# Patient Record
Sex: Female | Born: 1965
Health system: Southern US, Community
[De-identification: ages and names within clinical notes are randomized; demographics above are authoritative.]

## PROBLEM LIST (undated history)

## (undated) DIAGNOSIS — Q851 Tuberous sclerosis: Secondary | ICD-10-CM

## (undated) DIAGNOSIS — F329 Major depressive disorder, single episode, unspecified: Secondary | ICD-10-CM

## (undated) DIAGNOSIS — K7689 Other specified diseases of liver: Secondary | ICD-10-CM

## (undated) DIAGNOSIS — J189 Pneumonia, unspecified organism: Secondary | ICD-10-CM

## (undated) DIAGNOSIS — F909 Attention-deficit hyperactivity disorder, unspecified type: Secondary | ICD-10-CM

## (undated) DIAGNOSIS — F32A Depression, unspecified: Secondary | ICD-10-CM

## (undated) DIAGNOSIS — E611 Iron deficiency: Secondary | ICD-10-CM

## (undated) DIAGNOSIS — I10 Essential (primary) hypertension: Secondary | ICD-10-CM

## (undated) DIAGNOSIS — L814 Other melanin hyperpigmentation: Secondary | ICD-10-CM

## (undated) DIAGNOSIS — F319 Bipolar disorder, unspecified: Secondary | ICD-10-CM

## (undated) DIAGNOSIS — F419 Anxiety disorder, unspecified: Secondary | ICD-10-CM

## (undated) DIAGNOSIS — N289 Disorder of kidney and ureter, unspecified: Secondary | ICD-10-CM

## (undated) DIAGNOSIS — K635 Polyp of colon: Secondary | ICD-10-CM

## (undated) DIAGNOSIS — M5090 Cervical disc disorder, unspecified, unspecified cervical region: Secondary | ICD-10-CM

## (undated) HISTORY — DX: Major depressive disorder, single episode, unspecified: F32.9

## (undated) HISTORY — DX: Pneumonia, unspecified organism: J18.9

## (undated) HISTORY — DX: Essential (primary) hypertension: I10

## (undated) HISTORY — DX: Bipolar disorder, unspecified: F31.9

## (undated) HISTORY — DX: Cervical disc disorder, unspecified, unspecified cervical region: M50.90

## (undated) HISTORY — DX: Attention-deficit hyperactivity disorder, unspecified type: F90.9

## (undated) HISTORY — DX: Anxiety disorder, unspecified: F41.9

## (undated) HISTORY — DX: Disorder of kidney and ureter, unspecified: N28.9

## (undated) HISTORY — DX: Depression, unspecified: F32.A

## (undated) HISTORY — DX: Polyp of colon: K63.5

## (undated) HISTORY — PX: COLONOSCOPY W/ ENDOSCOPIC US: SHX1379

## (undated) HISTORY — DX: Other specified diseases of liver: K76.89

## (undated) HISTORY — DX: Iron deficiency: E61.1

## (undated) HISTORY — DX: Tuberous sclerosis: Q85.1

## (undated) HISTORY — DX: Other melanin hyperpigmentation: L81.4

## (undated) HISTORY — PX: RENAL ARTERY EMBOLIZATION: SHX2319

## (undated) HISTORY — PX: NEPHRECTOMY: SHX65

---

## 1998-11-01 ENCOUNTER — Other Ambulatory Visit: Admission: RE | Admit: 1998-11-01 | Discharge: 1998-11-01 | Payer: Self-pay | Admitting: *Deleted

## 2001-12-19 ENCOUNTER — Other Ambulatory Visit: Admission: RE | Admit: 2001-12-19 | Discharge: 2001-12-19 | Payer: Self-pay | Admitting: Obstetrics and Gynecology

## 2003-01-23 ENCOUNTER — Encounter: Payer: Self-pay | Admitting: Family Medicine

## 2003-01-23 ENCOUNTER — Encounter: Admission: RE | Admit: 2003-01-23 | Discharge: 2003-01-23 | Payer: Self-pay | Admitting: Family Medicine

## 2003-09-17 ENCOUNTER — Inpatient Hospital Stay (HOSPITAL_COMMUNITY): Admission: AD | Admit: 2003-09-17 | Discharge: 2003-09-19 | Payer: Self-pay | Admitting: Urology

## 2003-10-30 ENCOUNTER — Ambulatory Visit (HOSPITAL_COMMUNITY): Admission: RE | Admit: 2003-10-30 | Discharge: 2003-10-30 | Payer: Self-pay | Admitting: Urology

## 2003-10-31 ENCOUNTER — Encounter: Admission: RE | Admit: 2003-10-31 | Discharge: 2003-10-31 | Payer: Self-pay | Admitting: Urology

## 2004-05-25 ENCOUNTER — Inpatient Hospital Stay (HOSPITAL_COMMUNITY): Admission: EM | Admit: 2004-05-25 | Discharge: 2004-05-30 | Payer: Self-pay | Admitting: Vascular Surgery

## 2004-11-04 ENCOUNTER — Emergency Department (HOSPITAL_COMMUNITY): Admission: EM | Admit: 2004-11-04 | Discharge: 2004-11-04 | Payer: Self-pay | Admitting: *Deleted

## 2004-12-05 ENCOUNTER — Encounter: Admission: RE | Admit: 2004-12-05 | Discharge: 2004-12-05 | Payer: Self-pay | Admitting: Obstetrics and Gynecology

## 2005-07-07 ENCOUNTER — Ambulatory Visit (HOSPITAL_COMMUNITY): Admission: RE | Admit: 2005-07-07 | Discharge: 2005-07-07 | Payer: Self-pay | Admitting: Urology

## 2005-10-30 IMAGING — XA IR ANGIO/RENAL UNI WO/W FLUSH*R*
1 series · 13 of 24 positions shown · non-contrast
Comparison: none

CLINICAL DATA: Acute right retroperitoneal hemorrhage with multiple bilateral renal angiomyolipomas.  Previous right partial nephrectomy.  

1.  RIGHT RENAL ARTERIOGRAM
2.  RIGHT RENAL EMBOLIZATION
TECHNIQUE: The right groin region was prepped with Betadine, draped in the usual sterile fashion, and infiltrated locally with 1% lidocaine.  Intravenous fentanyl and Versed were administered as conscious sedation during continuous cardiorespiratory monitoring by the radiology staff.  The right common femoral artery was accessed with a 19 gauge needle, exchanged over a Bentson wire for a 5 French pigtail catheter, and advanced into the abdominal aorta for flush aortography.  This demonstrates single left and single right renal arteries.  The pigtail was then exchanged for a 5 French vascular sheath, through which a 5 French C2 catheter was advanced and used to selectively catheterize the right renal artery for selective right renal arteriography.  After identification of a vascular anomaly, the supplying third order branch was selectively catheterized with the 5 French C2 catheter with the aid of an angled glidewire.  More distal access was achieved with a coaxial Renegade microcatheter.  Through this, this branch artery was embolized using a combination of 2 mm, 3 mm, and 4 mm embolization coils through the microcatheter and through the C2 catheter.  Follow-up angiogram was performed.  The C2 catheter was then withdrawn back into the right renal artery for follow-up right renal arteriography.  Then, after telephone discussion of the findings with Dr. Jaylon, the catheter and sheath were removed and hemostasis achieved at the site.  No immediate complication.

[Series 1000: run · 0.19mm/px · 13 of 85 slices shown]
[im 1/85]
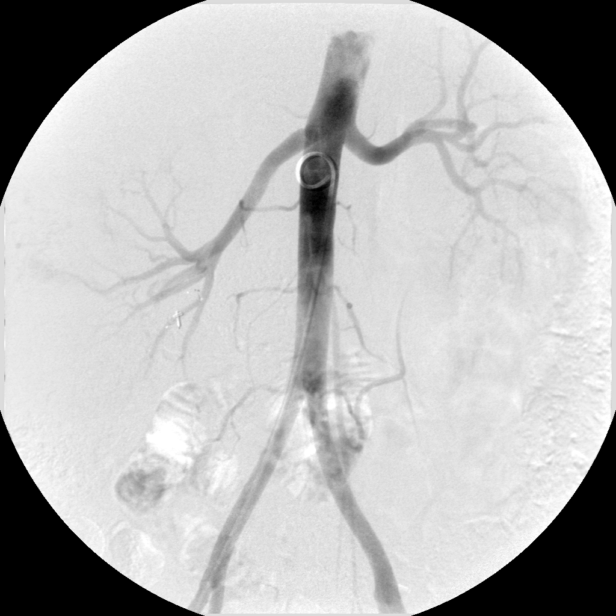
[im 8/85]
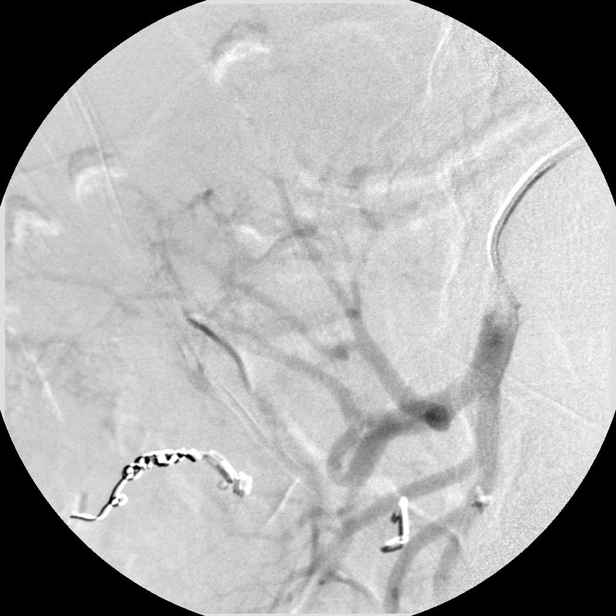
[im 15/85]
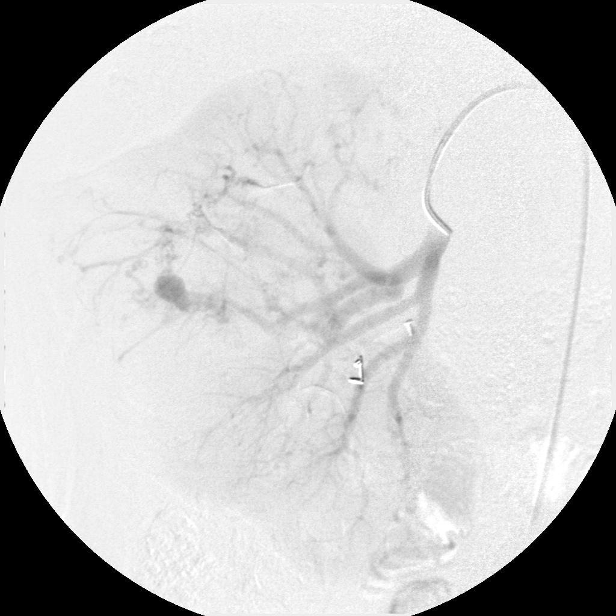
[im 22/85]
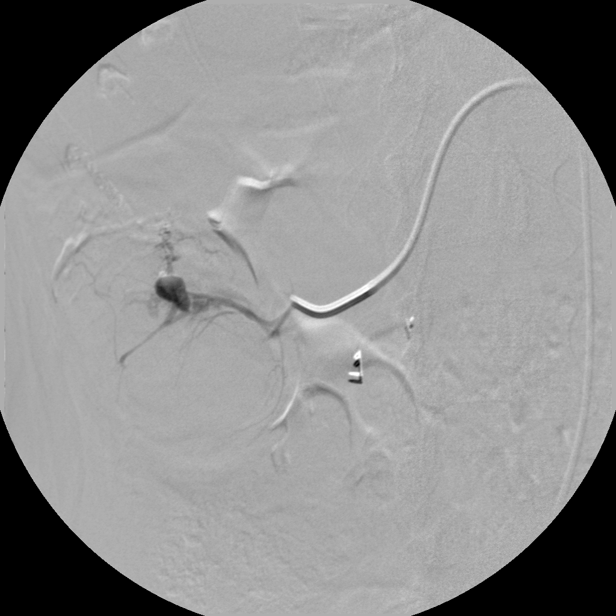
[im 30/85]
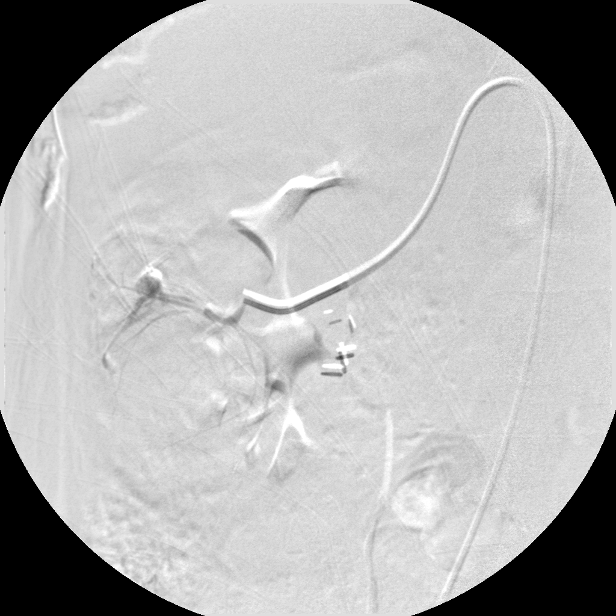
[im 37/85]
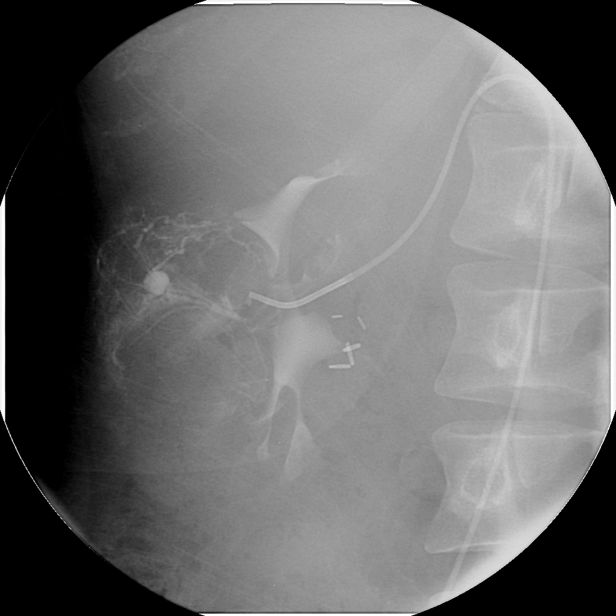
[im 44/85]
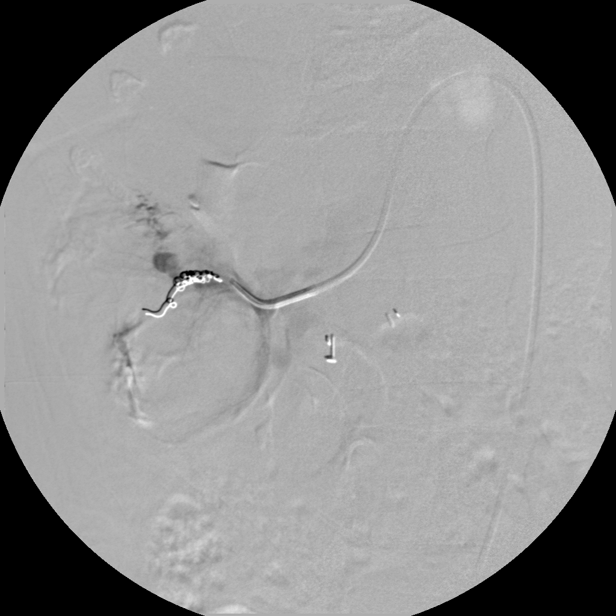
[im 48/85]
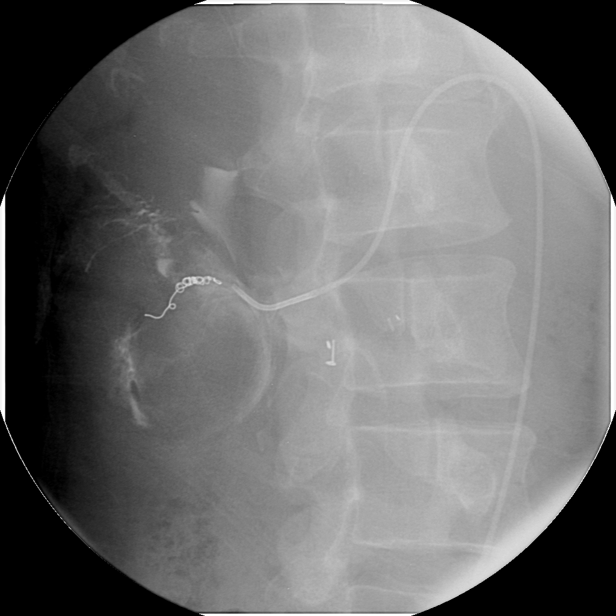
[im 55/85]
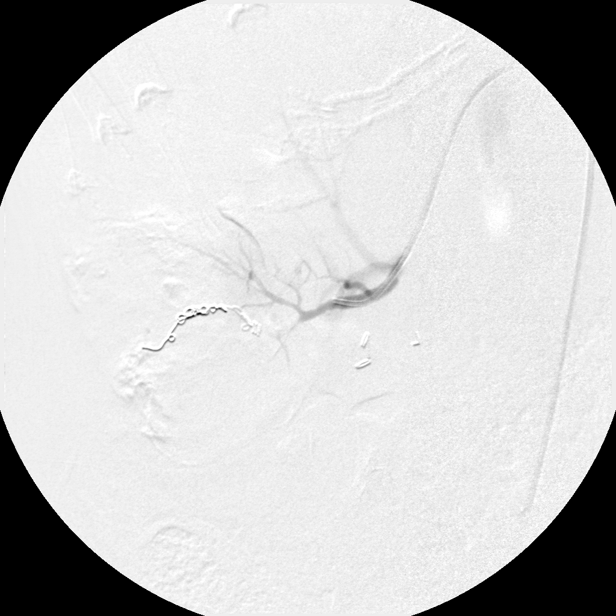
[im 63/85]
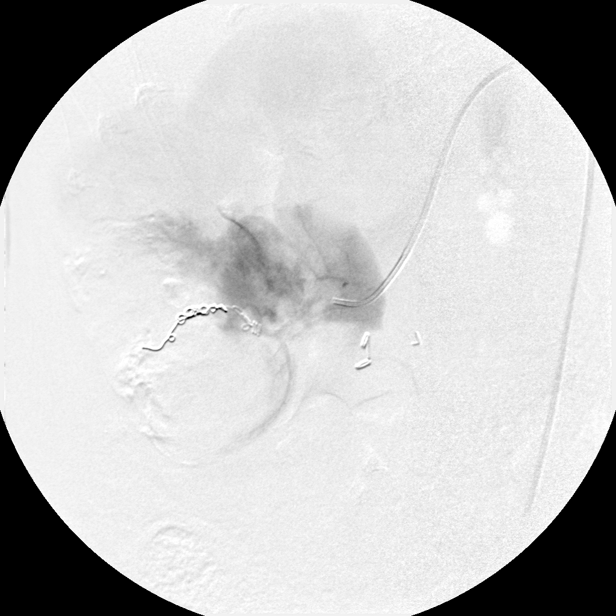
[im 70/85]
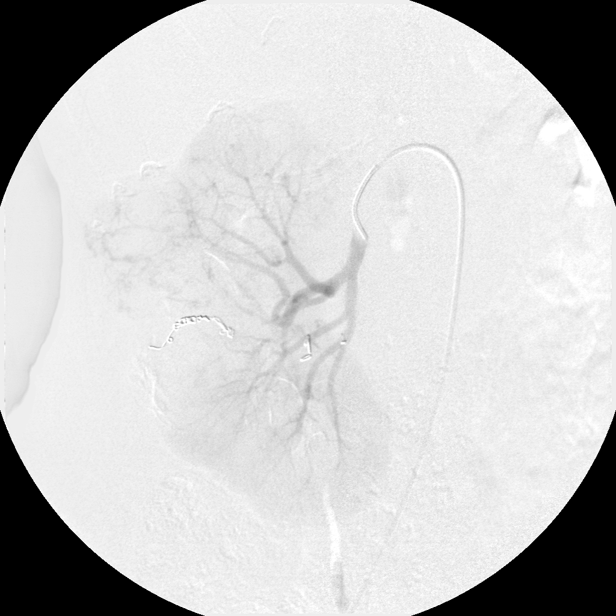
[im 77/85]
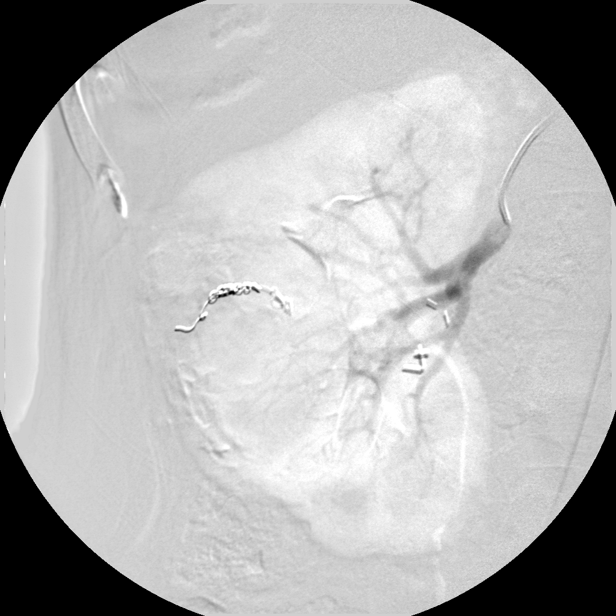
[im 85/85]
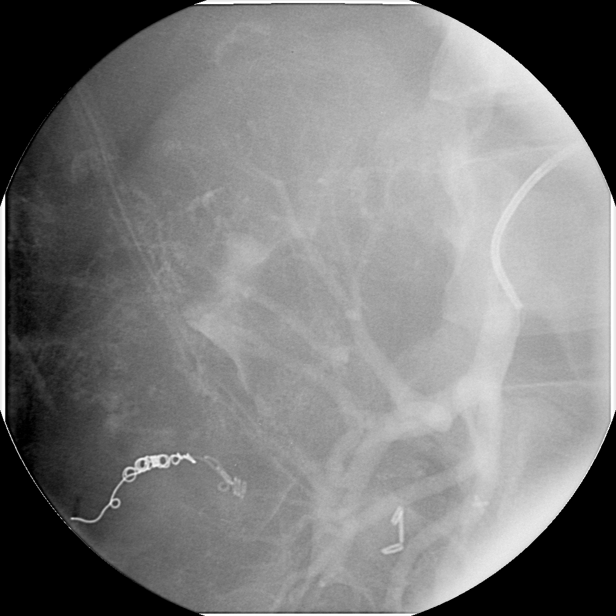

[13 of 24 positions shown; findings below may reference images not displayed]

FINDINGS: Single right and a single left renal arteries.  

Selective right renal arteriography demonstrates a prominent pseudoaneurysm from a midpole branch of the right renal artery, with extravasation.  There is a large area of decreased enhancement displacing the cortex, and some tumor vascularity is evident at the superior margin of this lesion which corresponds to the AML seen on recent CT scan.  Very early venous filling was identified from the region of the pseudoaneurysm suggesting an AV fistulous connection as well.  The vessel beyond the pseudoaneurysm was catheterized with a microcatheter and embolization coils were deposited distal and proximal to the lesion, resulting in complete cessation of flow through the pseudoaneurysm and AV fistula.  

Attention was then turned to the other tumor vascularity seen but this was found to be extensive with no discrete additional outstanding lesion.  After discussion with Dr. Jaylon, the procedure was then terminated.

IMPRESSION
1.  Multiple areas of tumor vascularity in the right kidney with a focal area of decreased blush in the mid to lower pole corresponding to the hematoma seen on CT.
2.  A discrete pseudoaneurysm with a small amount of extravasation was noted in the midst of the hematoma, associated with AV fistulous connection, and this was successfully embolized with multiple transcatheter coils as detailed above.

## 2006-01-28 ENCOUNTER — Encounter: Admission: RE | Admit: 2006-01-28 | Discharge: 2006-01-28 | Payer: Self-pay | Admitting: Obstetrics and Gynecology

## 2008-11-20 ENCOUNTER — Encounter: Admission: RE | Admit: 2008-11-20 | Discharge: 2008-11-20 | Payer: Self-pay | Admitting: Obstetrics and Gynecology

## 2009-05-03 ENCOUNTER — Ambulatory Visit (HOSPITAL_COMMUNITY): Admission: RE | Admit: 2009-05-03 | Discharge: 2009-05-03 | Payer: Self-pay | Admitting: Urology

## 2009-11-06 ENCOUNTER — Ambulatory Visit: Payer: Self-pay | Admitting: Internal Medicine

## 2009-11-06 ENCOUNTER — Inpatient Hospital Stay (HOSPITAL_COMMUNITY): Admission: AD | Admit: 2009-11-06 | Discharge: 2009-11-09 | Payer: Self-pay | Admitting: Urology

## 2010-04-24 ENCOUNTER — Encounter: Admission: RE | Admit: 2010-04-24 | Discharge: 2010-04-24 | Payer: Self-pay | Admitting: Obstetrics and Gynecology

## 2010-10-18 ENCOUNTER — Encounter: Payer: Self-pay | Admitting: Nephrology

## 2010-10-18 ENCOUNTER — Encounter: Payer: Self-pay | Admitting: Urology

## 2010-10-19 ENCOUNTER — Encounter: Payer: Self-pay | Admitting: Obstetrics and Gynecology

## 2010-10-19 ENCOUNTER — Encounter: Payer: Self-pay | Admitting: Nephrology

## 2010-12-17 LAB — PREPARE RBC (CROSSMATCH)

## 2010-12-17 LAB — CBC
HCT: 25.2 % — ABNORMAL LOW (ref 36.0–46.0)
HCT: 26.9 % — ABNORMAL LOW (ref 36.0–46.0)
Hemoglobin: 11.2 g/dL — ABNORMAL LOW (ref 12.0–15.0)
Hemoglobin: 7.1 g/dL — ABNORMAL LOW (ref 12.0–15.0)
Hemoglobin: 8.8 g/dL — ABNORMAL LOW (ref 12.0–15.0)
Hemoglobin: 9.2 g/dL — ABNORMAL LOW (ref 12.0–15.0)
MCHC: 34 g/dL (ref 30.0–36.0)
MCHC: 34.3 g/dL (ref 30.0–36.0)
MCV: 91.3 fL (ref 78.0–100.0)
Platelets: 316 10*3/uL (ref 150–400)
RDW: 13.6 % (ref 11.5–15.5)
RDW: 14 % (ref 11.5–15.5)
RDW: 14 % (ref 11.5–15.5)
RDW: 14.1 % (ref 11.5–15.5)
WBC: 8.6 10*3/uL (ref 4.0–10.5)

## 2010-12-17 LAB — COMPREHENSIVE METABOLIC PANEL
ALT: 32 U/L (ref 0–35)
ALT: 40 U/L — ABNORMAL HIGH (ref 0–35)
Albumin: 3.7 g/dL (ref 3.5–5.2)
Alkaline Phosphatase: 80 U/L (ref 39–117)
Calcium: 8.4 mg/dL (ref 8.4–10.5)
Chloride: 99 mEq/L (ref 96–112)
Glucose, Bld: 115 mg/dL — ABNORMAL HIGH (ref 70–99)
Glucose, Bld: 212 mg/dL — ABNORMAL HIGH (ref 70–99)
Potassium: 4.5 mEq/L (ref 3.5–5.1)
Sodium: 133 mEq/L — ABNORMAL LOW (ref 135–145)
Sodium: 136 mEq/L (ref 135–145)
Total Bilirubin: 0.6 mg/dL (ref 0.3–1.2)
Total Protein: 6.4 g/dL (ref 6.0–8.3)
Total Protein: 6.8 g/dL (ref 6.0–8.3)

## 2010-12-17 LAB — MRSA PCR SCREENING: MRSA by PCR: NEGATIVE

## 2010-12-17 LAB — CROSSMATCH

## 2010-12-17 LAB — APTT: aPTT: 29 seconds (ref 24–37)

## 2010-12-17 LAB — BASIC METABOLIC PANEL
CO2: 25 mEq/L (ref 19–32)
Chloride: 99 mEq/L (ref 96–112)
Glucose, Bld: 158 mg/dL — ABNORMAL HIGH (ref 70–99)
Potassium: 4.7 mEq/L (ref 3.5–5.1)
Sodium: 135 mEq/L (ref 135–145)

## 2010-12-17 LAB — HEMOGLOBIN AND HEMATOCRIT, BLOOD
HCT: 31.9 % — ABNORMAL LOW (ref 36.0–46.0)
Hemoglobin: 11 g/dL — ABNORMAL LOW (ref 12.0–15.0)

## 2011-02-13 NOTE — Discharge Summary (Signed)
Melissa Ochoa, Melissa Ochoa                      ACCOUNT NO.:  [id number]   MEDICAL RECORD NO.:  [id number]                   PATIENT TYPE:  INP   LOCATION:  0357                                 FACILITY:  [facility]   PHYSICIAN:  Sigmund I. Patsi Sears, M.D.         DATE OF BIRTH:  Aug 15, 1966   DATE OF ADMISSION:  09/17/2003  DATE OF DISCHARGE:  09/19/2003                                 DISCHARGE SUMMARY   DISCHARGE DIAGNOSES:  1. Bilateral angiomyolipomas.  2. Gross hematuria.  3. Status post right renal arteriogram and embolization.  4. Recurrent left hand and arm numbness.   HISTORY OF PRESENT ILLNESS:  This patient is a 45 year old white female  patient of Dr. Imelda Pillow with a history of partial nephrectomy in 1996  for an 8 cm angiomyolipoma.  She had an onset at 3 a.m. on December 20 of  severe right flank pain.  She reports that she was having a pedicure and sat  in a massage chair yesterday that kneaded her back fairly thoroughly.  She  reports that the pain awoke her from sleep and did become severe with  associated nausea, but no voiding complaints.  She denies any history of  stones or other urological problems other than seeing some blood in her  urine.   ALLERGIES:  No known drug allergies.   CURRENT MEDICATIONS:  1. Paxil 10 mg p.o. daily.  2. Birth control pills.   PAST MEDICAL HISTORY:  1. Hypertension.  2. History of angiomyolipomas with partial nephrectomy in 1996.   HOSPITAL COURSE:  The patient was admitted to the hospital by Dr. Annabell Howells on  December 20.  At that time she was given IV narcotics to help control her  pain.  On September 18, 2003 she was taken to interventional radiology where  a right renal arteriogram was performed.  At that time Dr. Deanne Coffer also  performed an embolization.  He discussed results with Dr. Patsi Sears  including that there were several other small bleeds and that follow-up is  certainly an issue.  She was also consulted by Dr. Melbourne Abts for some  recurrent left arm numbness.  She had an MRI or her head and cervical spine  done this morning.  Those results are pending.  Her pain has been well-  controlled while she has been in the hospital, but the patient complains of  some abdominal crampi0ng at times.  She also states that she has not had a  bowel movement in several days and feels constipated.  Dr. Patsi Sears again  has talked with Dr. Deanne Coffer about the results of her arteriogram and has  elected to let Ms. Tri go home today.  We will cover her with narcotics  to keep her comfortable over the holiday.  She will return to our office  next week for workup for tubular sclerosis.  Note she had a workup for this  back in 1996 which was negative.  We will  also discuss future treatment  plans with her at that time.   DISCHARGE MEDICATIONS:  1. For her to resume all home medications.  2. Percocet 5/325 one p.o. q.4-6 p.r.n. pain.  3. I have also discussed with her the fact that she needs to use some over     the counter products for her constipation.  4. Also discussed with her that the Percocet will continue to keep her     constipated if she does not take some action to get her bowels moving.     The patient understands and will get some over the counter products.   CONDITION ON DISCHARGE:  Stable.   PLAN:  The patient will follow up with Terri Piedra, nurse practitioner, in  the office next week for some blood work as well as Dr. Patsi Sears, for  further treatment plans.     Terri Piedra, N.P.                         Sigmund I. Patsi Sears, M.D.    HB/MEDQ  D:  09/19/2003  T:  09/19/2003  Job:  [id number]   cc:   Genene Churn. Love, M.D.  [address]  [address]  [place]  [place] [address]  Fax: [phone]

## 2011-02-13 NOTE — Consult Note (Signed)
NAMEJAE, BRUCK                      ACCOUNT NO.:  [id number]   MEDICAL RECORD NO.:  [id number]                   PATIENT TYPE:  INP   LOCATION:  0357                                 FACILITY:  [facility]   PHYSICIAN:  Genene Churn. Love, M.D.                 DATE OF BIRTH:  [birth year]   DATE OF CONSULTATION:  09/18/2003  DATE OF DISCHARGE:                                   CONSULTATION   CHIEF COMPLAINT:  This 45 year old right-handed white married female was  admitted to [facility] with right-sided pain and was found to have  evidence of a right renal hematoma.  She is now seen for evaluation of  recurrent left arm numbness.   HISTORY OF PRESENT ILLNESS:  Mrs. Kell had a partial right nephrectomy  in 1996 for a bleeding angiomyolipoma.  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  She has been in good health her entire  life otherwise, kickboxer, and enjoys vigorous activities.  Over the last  year she has had hypertension.  She was admitted for evaluation of right  flank pain that developed when she was having a pedicure and sitting in a  massage chair the day prior to the admission.  In the hospital she  complained of recurrent left hand and arm numbness and I am asked to see  her.  She states that over the last year she has had recurrent left hand and  arm numbness which is vaguely localized and not associated with neck pain  radiating into the left arm.  She does have neck pain and a pressure  sensation in her head when bending her head forward.  She has no history of  L'hermitte's sign or bowel or bladder dysfunction.  She has had no injuries  to her head and neck.  Her left face and left leg are not involved with  similar symptoms.  Her symptoms when they occur last minutes and are not  related to activity.  She notes there is nothing which makes them better and  nothing which brings them on.  In the hospital she has had a CBC and a basic  metabolic panel which are unremarkable.   PAST MEDICAL HISTORY:  1. Hypertension.  2. Right kidney surgery in 1996 for angiomyolipoma.   SOCIAL HISTORY:  She does smoke cigarettes one pack per day for  approximately three years.  She does not drink alcohol.  She works as a  Agricultural consultant for a company.  She has had no other serious medical  problems.   ALLERGIES:  She has no allergies.   PHYSICAL EXAMINATION:  GENERAL:  Well-developed, sleepy white female.  VITAL SIGNS:  Blood pressure lying right arm 110/80, left arm 120/80, heart  rate 84.  NECK:  There were no bruits.  There is a  slight sound in her left carotid  which seemed to be radiating from her heart.  Neck was supple.  NEUROLOGIC:  Mental status:  She was alert and oriented x3.  Cranial nerve  examination revealed visual fields full, disks flat, spontaneous venous  pulsations seen, extraocular movements full, corneals present.  Facial  sensation equal.  No facial motor asymmetry.  Hearing present.  Air  conduction greater than bone conduction.  Tongue midline.  Uvula midline.  Gag is present.  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  Motor examination:  Good  strength upper and lower extremities without any evidence of proximal  pronator or distal drift.  Coordination testing normal.  Sensory examination  intact to pinprick, touch, joint position, and vibration testing.  Deep  tendon reflexes 1-2+.  Plantar responses downgoing.   IMPRESSION:  1. Recurrent left hand and arm numbness, etiology uncertain, rule out left     cervical radiculopathy (code 782.0 and 353.2).  2. Angiomyolipoma status post right partial nephrectomy and with current     right renal hematoma.  3. Recurrent nausea and vomiting in the hospital.   PLAN:  Obtain MRI study of the brain and cervical spine.                                               Genene Churn. Sandria Manly, M.D.     JML/MEDQ  D:  09/18/2003  T:  09/18/2003  Job:  [id number]

## 2011-02-13 NOTE — H&P (Signed)
Melissa Ochoa, Melissa Ochoa                      ACCOUNT NO.:  [id number]   MEDICAL RECORD NO.:  [id number]                   PATIENT TYPE:  INP   LOCATION:  5524                                 FACILITY:  [facility]   PHYSICIAN:  Maretta Bees. Vonita Moss, M.D.             DATE OF BIRTH:  03/16/1966   DATE OF ADMISSION:  05/25/2004  DATE OF DISCHARGE:                                HISTORY & PHYSICAL   HISTORY OF PRESENT ILLNESS:  This lady called me this evening at home with  severe right flank pain and I instructed her to come to the emergency room  where she verified that today she had the onset of very severe right flank  pain and was concerned about bleeding into the right kidney.  Nine years  ago, Dr. Lynelle Smoke I. Patsi Sears did a partial nephrectomy for a tumor that  turned out to be an angiomyolipoma.  Subsequently, she has developed more  angiomyolipomas.  Apparently, she does not have a diagnosis of tuberous  sclerosis.   Last December 2004, she had bleeding in the right kidney and had to undergo  arterial embolization to control it.  She has done well since then.  She is  having no fevers.  She has no acute voiding symptoms.  She has no hematuria.   Tonight, her CBC revealed a hemoglobin of 13.6 and her BUN was 9, and her  creatinine 0.8.  Her sodium and potassium were within normal limits.   She then underwent a IV contrasted CT scan that showed multiple lesions in  the left kidney consistent with angiomyolipomas but in the right kidney  metal clips were seen in the previous embolization coil but more significant  she had significant hemorrhage in the upper pole of the right kidney with  some perinephric bleeding.   PAST MEDICAL HISTORY:  Hypertension.   MEDICATIONS:  1. Altace 20 mg.  2. Necon for birth control.   ALLERGIES:  None.   HABITS:  Tobacco:  Occasional cigarettes.  Alcohol:  None.   FAMILY HISTORY:  Negative for angiomyolipomas.   PAST SURGICAL HISTORY:  Includes  just the procedures on her right kidney.   PHYSICAL EXAMINATION:  GENERAL:  She is alert and oriented but complaining  about significant right flank pain.  VITAL SIGNS:  Blood pressure of 110/palpable, pulse is 85, respiratory rate  18.  NECK:  Supple.  No respiratory distress.  HEART:  Regular.  ABDOMEN:  Guarding and tender in the right flank.  No hernias.  No inguinal  nodes.  Liver and spleen not palpable.  EXTREMITIES:  No peripheral edema.   IMPRESSION:  1. Right renal hemorrhage due to bleeding from an angiomyolipoma.  2. Bilateral renal angiomyolipomas.  3. Hypertension.   PLAN:  She will admitted for pain relief, hydration, observation, and  possible intervention pending her clinical course.  Maretta Bees. Vonita Moss, M.D.    LJP/MEDQ  D:  05/25/2004  T:  05/26/2004  Job:  [id number]   cc:   Terrial Rhodes, M.D.  [address]  [place]  [place] [address]  Fax: [phone]

## 2011-02-13 NOTE — H&P (Signed)
NAMEPERLINE, AWE                      ACCOUNT NO.:  [id number]   MEDICAL RECORD NO.:  [id number]                   PATIENT TYPE:   LOCATION:                                       FACILITY:   PHYSICIAN:  Excell Seltzer. Annabell Howells, M.D.                 DATE OF BIRTH:   DATE OF ADMISSION:  09/17/2003  DATE OF DISCHARGE:                                HISTORY & PHYSICAL   CHIEF COMPLAINT:  Right flank pain.   HISTORY OF PRESENT ILLNESS:  The patient is a 45 year old white female, a  former patient of Dr. Lynelle Smoke I. Tannenbaum's with a history of a partial  nephrectomy in 1996, for a right 8.0 cm angiomyolipoma.  She had the onset  at 3 a.m. this morning of severe right flank pain.  She reports that she was  having a pedicure and sat in a massage chair yesterday that kneaded her back  fairly thoroughly.  She reports that the pain awoke her from sleep and is  now severe with associated nausea but no voiding complaints.  She denies a  history of stones or other urologic problems.   ALLERGIES:  No known drug allergies.   CURRENT MEDICATIONS:  1. Paxil 10 mg daily.  2. An oral contraceptive.   PAST MEDICAL HISTORY:  Pertinent for hypertension.   PAST SURGICAL HISTORY:  The kidney surgery.   SOCIAL HISTORY:  She has been a one-pack-per-day-smoker for three years.  She denies alcohol.  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   FAMILY HISTORY:  Pertinent for high blood pressure.  Her father died at age  74 of cancer.   REVIEW OF SYSTEMS:  She has had some weight loss that she states is stress  over the last several months.  She has also had some chronic headaches that  she has attributed to allergies.  She stays somewhat excessively thirsty.  She has had night sweats off and on for the past year, and  has been  fatigued.  She report some nausea as noted, some shortness of breath.  She  is otherwise without complaints per our check list, except as above.  She  has had some  occasional weakness in her left upper extremity.   PHYSICAL EXAMINATION:  VITAL SIGNS:  Blood pressure 170/99, pulse 87,  temperature 97.2 degrees.  Weight 168 pounds.  GENERAL:  She is a well-nourished, well-developed white female, who appears  in moderate distress.  She is alert and oriented x3.  HEENT:  Normocephalic, atraumatic.  NECK:  Supple without thyromegaly or bruit.  SKIN:  Reveals some acneform lesions across her upper cheeks and nose.  LUNGS:  Clear with normal effort.  HEART:  A regular rate and rhythm.  ABDOMEN:  Soft, flat, with right upper quadrant tenderness.  No mass, no  hepatosplenomegaly, or CVA tenderness is noted.  There is no hernia or  inguinal adenopathy.  GENITOURINARY:  Not indicated.  RECTAL:  Exam not indicated.  EXTREMITIES:  A full range of motion without edema.  NEUROLOGIC:  She is grossly intact.  SKIN:  Skin changes are noted above.   LABORATORY DATA:  A urine specimen today at greater than 50 red cells per  high power field.  A CT urogram was obtained.  This study revealed a right pararenal hematoma  with evidence of fat density in multiple areas of the kidney, consistent  with multifocal angiomyolipomas.  Additionally, there were several lesions  in the left kidney, also suggestive of angiomyolipomas.  The largest  appeared to measure 3.0 to 4.0 cm anteriorly off the mid-portion of the  kidney.  No other significant abnormalities were noted on my review, but the  radiologist will fully review the study.  After a review of the CT urogram, I felt that a contrasted study was  indicated.  A BUN and creatinine were obtained.  The BUN was 16, creatinine  0.6.  She then underwent a contrast-enhanced CT scan.  This study confirmed the  presence of multiple lesions bilaterally, consistent with angiomyolipomas.  There appeared to be a hematoma within the largest lesion in the lower pole,  with contrast evident on the vascular phase, suggestive of  persistent  bleeding.  She was also noted to have multiple probable cystic lesions  within the liver.  A formal review of this study will also be forthcoming  from radiology.  I spoke with Dr. Dwyane Luo. Fischer in radiology about this patient.  He  suggested that an MRI might be useful, going forward to further clarify the  nature of these lesions.  I spoke with Dr. Patsi Sears as well, who is her  primary urologist, about her current situation.  She apparently had been  evaluated for tubular sclerosis in 1996, but that was negative.  She was given Demerol 100 mg and Phenergan 25 mg with a partial relief of  her discomfort.   IMPRESSION:  1. Right renal hematoma in a patient with bilateral angiomyolipomas with     prior right partial nephrectomy.  2. History of hypertension.  3. Headaches.  4. Some intermittent left arm numbness.   PLAN:  She is going to be admitted to the hospital for observation with  serial hemoglobins and also pain management.  It is possible that she will  need a nephrectomy or some sort of embolization if her hemoglobin  declines significantly.  Additionally, Dr. Genene Churn. Love will be consulted  for further evaluation of the headaches and the upper extremity weakness.  An MRI of the head may be required to evaluate for tuberous lesions in that  area.                                               Excell Seltzer. Annabell Howells, M.D.    JJW/MEDQ  D:  09/17/2003  T:  09/17/2003  Job:  [id number]   cc:   Genene Churn. Love, M.D.  [address]  [address]  [place]  [place] [address]  Fax: [phone]   Scherrie Gerlach, M.D.   Jethro Bolus, M.D.

## 2011-02-13 NOTE — Discharge Summary (Signed)
NAME:  Melissa Ochoa, Melissa Ochoa                      ACCOUNT NO.:  [id number]   MEDICAL RECORD NO.:  [id number]                   PATIENT TYPE:  INP   LOCATION:  5524                                 FACILITY:  [facility]   PHYSICIAN:  Sigmund I. Patsi Sears, M.D.         DATE OF BIRTH:  25-Aug-1966   DATE OF ADMISSION:  05/25/2004  DATE OF DISCHARGE:                                 DISCHARGE SUMMARY   DISCHARGE DIAGNOSES:  1.  Multiple angiomyolipomas bilaterally.  2.  Embolization of right upper pole angiomyolipoma.   HISTORY OF PRESENT ILLNESS:  Melissa Ochoa is a well known patient of Dr.  Mora Appl without has had problems with angiomyolipomas in the past.  She was hospitalized earlier this year for bleeding through one of her  tumors and subsequently had embolization at that time.  She has gone for  several months without problems and just having intermittent flank pain.  She has been screened for a diagnosis of tuberous sclerosis but that has all  been negative.  She called Dr. Vonita Moss who was on call on August 28 and was  complaining of worsening right flank pain and she was concerned about  bleeding.  She admitted her to the hospital and CT scan was obtained.  It  showed that she had multiple lesions in her left kidney consistent with  angeiomyolipomas in the right kidney.  She had a previous embolization but  she had a significant hemorrhage in the upper pole of the right kidney with  some perinephric bleeding.  She was admitted for pain control and evaluation  for further embolization.   PAST MEDICAL HISTORY:  1.  History of angeiomyolipomas.  2.  History of hypertension.   MEDICATIONS:  1.  Altace 2.5 mg daily on admission.  2.  Neocon birth control.   ALLERGIES:  None.   HOSPITAL COURSE:  The patient was admitted on August 28 and was started on a  morphine pump for pain control.  Dr. Patsi Sears started following her the next day and she was evaluated for  her pain control.   She was having lots of problems with nausea and  constipation due to the morphine pump and the morphine pump was  discontinued.  Hemoglobin continued to trend downward over the next couple  of days and on May 28, 2004 her right pole was embolized in  interventional radiology.  The embolization was successful and the patient  continued to do a little better as far as her flank pain.  However, that  night she complained of increasingly more pain and her PCA pump was started  again by one of the on-call doctors.  However, after making rounds  yesterday, we decided to discontinue her morphine pump and treat her with  p.o. Dilaudid and Phenergan.  She has since had a couple of soap suds enemas  and is feeling a little better as far as her constipation relief.  She  states she has continued pain  and is in need of her narcotics.  On September  2, hemoglobin stabilized at 10.4 and the patient's blood pressure was back  under control due to the help of Dr. Abel Presto.  He increased her Altace  and added Labetalol 200 mg p.o. daily.  On rounds this morning, the patient  seemed to have pain a little better under control and blood pressure was  well under control.  Hemoglobin was stable and we talked about discharging  to home.  She will be discharged to home on pain medications and expect to  follow up with Dr. Patsi Sears next Tuesday for CBC and re evaluation.   DISCHARGE LABORATORIES:  Sodium 139, potassium 3.9, chloride 103, CO2 30,  glucose 108, BUN is 1 and creatinine is 0.6.  Hemoglobin 10.4, hematocrit  30.3, platelets 365, white blood cells 7.7.  Vital signs were stable on  discharge.   DISCHARGE MEDICATIONS:  1.  Blood pressure medicines per Dr. Abel Presto.  2.  Resume home medications.  3.  Dilaudid 4 mg q.3-4 hours p.r.n. pain.  4.  Phenergan 25 mg p.o. q.6 hours p.r.n. nausea.   CONDITION ON DISCHARGE:  Stable.   PLAN:  The patient will follow up with Dr. Patsi Sears on Tuesday  for repeat  CBC and follow up in the office.  She will follow up with Dr. Abel Presto per  his request.      Terri Piedra, N.P.                         Sigmund I. Patsi Sears, M.D.    Suella Grove  D:  05/30/2004  T:  05/30/2004  Job:  [id number]

## 2011-07-27 ENCOUNTER — Other Ambulatory Visit: Payer: Self-pay | Admitting: Nephrology

## 2011-07-27 DIAGNOSIS — R51 Headache: Secondary | ICD-10-CM

## 2011-07-29 ENCOUNTER — Other Ambulatory Visit: Payer: Self-pay

## 2011-08-07 ENCOUNTER — Other Ambulatory Visit: Payer: Self-pay

## 2011-09-07 ENCOUNTER — Other Ambulatory Visit (HOSPITAL_COMMUNITY): Payer: Self-pay | Admitting: Nephrology

## 2011-09-07 DIAGNOSIS — N289 Disorder of kidney and ureter, unspecified: Secondary | ICD-10-CM

## 2011-09-24 ENCOUNTER — Other Ambulatory Visit (HOSPITAL_COMMUNITY): Payer: Self-pay | Admitting: Nephrology

## 2011-09-24 DIAGNOSIS — R51 Headache: Secondary | ICD-10-CM

## 2011-09-25 ENCOUNTER — Inpatient Hospital Stay (HOSPITAL_COMMUNITY)
Admission: RE | Admit: 2011-09-25 | Discharge: 2011-09-25 | Payer: Self-pay | Source: Ambulatory Visit | Attending: Nephrology | Admitting: Nephrology

## 2011-09-25 ENCOUNTER — Inpatient Hospital Stay (HOSPITAL_COMMUNITY): Admission: RE | Admit: 2011-09-25 | Payer: Self-pay | Source: Ambulatory Visit

## 2011-09-25 ENCOUNTER — Ambulatory Visit (HOSPITAL_COMMUNITY)
Admission: RE | Admit: 2011-09-25 | Discharge: 2011-09-25 | Disposition: A | Discharge status: Home / Self Care | Payer: BC Managed Care – PPO | Source: Ambulatory Visit | Attending: Nephrology | Admitting: Nephrology

## 2011-09-25 DIAGNOSIS — E236 Other disorders of pituitary gland: Secondary | ICD-10-CM | POA: Insufficient documentation

## 2011-09-25 DIAGNOSIS — R51 Headache: Secondary | ICD-10-CM

## 2011-09-25 MED ORDER — GADOBENATE DIMEGLUMINE 529 MG/ML IV SOLN
20.0000 mL | Freq: Once | INTRAVENOUS | Status: AC | PRN
  Administered 2011-09-25: 17 mL via INTRAVENOUS

## 2011-09-26 ENCOUNTER — Ambulatory Visit (HOSPITAL_COMMUNITY)
Admission: RE | Admit: 2011-09-26 | Discharge: 2011-09-26 | Disposition: A | Discharge status: Home / Self Care | Payer: BC Managed Care – PPO | Source: Ambulatory Visit | Attending: Nephrology | Admitting: Nephrology

## 2011-09-26 DIAGNOSIS — N289 Disorder of kidney and ureter, unspecified: Secondary | ICD-10-CM

## 2011-09-26 DIAGNOSIS — Q851 Tuberous sclerosis: Secondary | ICD-10-CM | POA: Insufficient documentation

## 2011-09-26 DIAGNOSIS — D3000 Benign neoplasm of unspecified kidney: Secondary | ICD-10-CM | POA: Insufficient documentation

## 2011-09-26 DIAGNOSIS — Z905 Acquired absence of kidney: Secondary | ICD-10-CM | POA: Insufficient documentation

## 2011-09-26 MED ORDER — GADOBENATE DIMEGLUMINE 529 MG/ML IV SOLN
17.0000 mL | Freq: Once | INTRAVENOUS | Status: AC | PRN
  Administered 2011-09-26: 17 mL via INTRAVENOUS

## 2012-04-25 ENCOUNTER — Other Ambulatory Visit: Payer: Self-pay | Admitting: Nephrology

## 2012-04-25 DIAGNOSIS — R109 Unspecified abdominal pain: Secondary | ICD-10-CM

## 2012-04-26 ENCOUNTER — Other Ambulatory Visit: Payer: BC Managed Care – PPO

## 2012-04-28 ENCOUNTER — Ambulatory Visit
Admission: RE | Admit: 2012-04-28 | Discharge: 2012-04-28 | Disposition: A | Discharge status: Home / Self Care | Payer: Medicare Other | Source: Ambulatory Visit | Attending: Nephrology | Admitting: Nephrology

## 2012-04-28 DIAGNOSIS — R109 Unspecified abdominal pain: Secondary | ICD-10-CM

## 2012-04-28 MED ORDER — GADOBENATE DIMEGLUMINE 529 MG/ML IV SOLN
17.0000 mL | Freq: Once | INTRAVENOUS | Status: AC | PRN
  Administered 2012-04-28: 17 mL via INTRAVENOUS

## 2012-11-07 ENCOUNTER — Other Ambulatory Visit: Payer: Self-pay | Admitting: Nephrology

## 2012-11-07 DIAGNOSIS — D1771 Benign lipomatous neoplasm of kidney: Secondary | ICD-10-CM

## 2012-11-07 DIAGNOSIS — Q851 Tuberous sclerosis: Secondary | ICD-10-CM

## 2012-11-12 ENCOUNTER — Ambulatory Visit
Admission: RE | Admit: 2012-11-12 | Discharge: 2012-11-12 | Disposition: A | Discharge status: Home / Self Care | Payer: Medicare Other | Source: Ambulatory Visit | Attending: Nephrology | Admitting: Nephrology

## 2012-11-12 ENCOUNTER — Inpatient Hospital Stay
Admission: RE | Admit: 2012-11-12 | Discharge: 2012-11-12 | Disposition: A | Discharge status: Home / Self Care | Payer: Medicare Other | Source: Ambulatory Visit | Attending: Nephrology | Admitting: Nephrology

## 2012-11-12 DIAGNOSIS — D1771 Benign lipomatous neoplasm of kidney: Secondary | ICD-10-CM

## 2012-11-12 DIAGNOSIS — Q851 Tuberous sclerosis: Secondary | ICD-10-CM

## 2013-05-12 ENCOUNTER — Other Ambulatory Visit (HOSPITAL_COMMUNITY): Payer: Self-pay | Admitting: Internal Medicine

## 2013-05-12 DIAGNOSIS — R140 Abdominal distension (gaseous): Secondary | ICD-10-CM

## 2013-05-12 DIAGNOSIS — Q851 Tuberous sclerosis: Secondary | ICD-10-CM | POA: Insufficient documentation

## 2013-05-16 ENCOUNTER — Ambulatory Visit (HOSPITAL_COMMUNITY)
Admission: RE | Admit: 2013-05-16 | Discharge: 2013-05-16 | Disposition: A | Discharge status: Home / Self Care | Payer: Medicare Other | Source: Ambulatory Visit | Attending: Internal Medicine | Admitting: Internal Medicine

## 2013-05-16 DIAGNOSIS — R140 Abdominal distension (gaseous): Secondary | ICD-10-CM

## 2013-05-16 DIAGNOSIS — K769 Liver disease, unspecified: Secondary | ICD-10-CM | POA: Insufficient documentation

## 2013-05-16 DIAGNOSIS — Q851 Tuberous sclerosis: Secondary | ICD-10-CM | POA: Insufficient documentation

## 2013-05-16 DIAGNOSIS — N289 Disorder of kidney and ureter, unspecified: Secondary | ICD-10-CM | POA: Insufficient documentation

## 2013-05-16 DIAGNOSIS — R141 Gas pain: Secondary | ICD-10-CM | POA: Insufficient documentation

## 2013-05-16 DIAGNOSIS — R142 Eructation: Secondary | ICD-10-CM | POA: Insufficient documentation

## 2013-05-18 ENCOUNTER — Other Ambulatory Visit (HOSPITAL_COMMUNITY): Payer: Self-pay | Admitting: Internal Medicine

## 2013-05-18 DIAGNOSIS — R160 Hepatomegaly, not elsewhere classified: Secondary | ICD-10-CM

## 2013-05-24 ENCOUNTER — Ambulatory Visit (HOSPITAL_COMMUNITY)
Admission: RE | Admit: 2013-05-24 | Discharge: 2013-05-24 | Disposition: A | Discharge status: Home / Self Care | Payer: Medicare Other | Source: Ambulatory Visit | Attending: Internal Medicine | Admitting: Internal Medicine

## 2013-05-24 DIAGNOSIS — N289 Disorder of kidney and ureter, unspecified: Secondary | ICD-10-CM | POA: Insufficient documentation

## 2013-05-24 DIAGNOSIS — R160 Hepatomegaly, not elsewhere classified: Secondary | ICD-10-CM

## 2013-05-24 DIAGNOSIS — K7689 Other specified diseases of liver: Secondary | ICD-10-CM | POA: Insufficient documentation

## 2013-05-24 DIAGNOSIS — Z905 Acquired absence of kidney: Secondary | ICD-10-CM | POA: Insufficient documentation

## 2013-05-24 MED ORDER — GADOXETATE DISODIUM 0.25 MMOL/ML IV SOLN
9.0000 mL | Freq: Once | INTRAVENOUS | Status: AC | PRN
  Administered 2013-05-24: 9 mL via INTRAVENOUS

## 2013-07-10 ENCOUNTER — Other Ambulatory Visit: Payer: Self-pay

## 2013-07-10 DIAGNOSIS — Z1231 Encounter for screening mammogram for malignant neoplasm of breast: Secondary | ICD-10-CM

## 2013-07-11 ENCOUNTER — Ambulatory Visit
Admission: RE | Admit: 2013-07-11 | Discharge: 2013-07-11 | Disposition: A | Discharge status: Home / Self Care | Payer: Medicare Other | Source: Ambulatory Visit

## 2013-07-11 DIAGNOSIS — Z1231 Encounter for screening mammogram for malignant neoplasm of breast: Secondary | ICD-10-CM

## 2013-07-28 ENCOUNTER — Encounter: Payer: Self-pay | Admitting: Internal Medicine

## 2013-08-28 ENCOUNTER — Ambulatory Visit (INDEPENDENT_AMBULATORY_CARE_PROVIDER_SITE_OTHER): Payer: Medicare Other | Admitting: Internal Medicine

## 2013-08-28 ENCOUNTER — Other Ambulatory Visit (INDEPENDENT_AMBULATORY_CARE_PROVIDER_SITE_OTHER): Payer: Medicare Other

## 2013-08-28 ENCOUNTER — Encounter: Payer: Self-pay | Admitting: Internal Medicine

## 2013-08-28 VITALS — BP 112/62 | HR 68 | Ht 69.0 in | Wt 191.0 lb

## 2013-08-28 DIAGNOSIS — R141 Gas pain: Secondary | ICD-10-CM

## 2013-08-28 DIAGNOSIS — R1310 Dysphagia, unspecified: Secondary | ICD-10-CM

## 2013-08-28 DIAGNOSIS — K589 Irritable bowel syndrome without diarrhea: Secondary | ICD-10-CM

## 2013-08-28 DIAGNOSIS — Z1211 Encounter for screening for malignant neoplasm of colon: Secondary | ICD-10-CM

## 2013-08-28 LAB — IGA: IgA: 23 mg/dL — ABNORMAL LOW (ref 68–378)

## 2013-08-28 MED ORDER — MOVIPREP 100 G PO SOLR: 1.0000 | Freq: Once | ORAL | Status: DC

## 2013-08-28 NOTE — Patient Instructions (Signed)
Your physician has requested that you go to the basement for the following lab work before leaving today:  TTG, IGA    You have been scheduled for an endoscopy and colonoscopy with propofol. Please follow the written instructions given to you at your visit today. Please pick up your prep at the pharmacy within the next 1-3 days. If you use inhalers (even only as needed), please bring them with you on the day of your procedure. Your physician has requested that you go to www.startemmi.com and enter the access code given to you at your visit today. This web site gives a general overview about your procedure. However, you should still follow specific instructions given to you by our office regarding your preparation for the procedure.  

## 2013-08-28 NOTE — Progress Notes (Signed)
HISTORY OF PRESENT ILLNESS:  Melissa Ochoa is a 47 y.o. female with tuberous sclerosis, status post right nephrectomy for complicated renal angiomyolipomass, embolization therapy to left kidney for the same in 2011, bipolar disorder, attention, cervical disc disease, and hepatic lesion identified as focal nodular hyperplasia. The patient is referred today by Dr. Arrie Aran regarding chronic GI complaints and the liver lesion. Patient reports a lifetime of problems with bloating. Worse since July of this year. She apparently has chronic constipation for which she takes colon cleanse daily. She reports 5-10 pound weight gain over the past year. Other GI complaints include intermittent dysphagia to pills and occasionally solids. No classic reflux symptoms. No prior GI evaluations. Ultrasound an MRI from August of this year have been reviewed. MRI revealed solitary area of focal nodular hyperplasia maximal diameter 2.6 cm which was slightly larger than prior exam a few years previous. Review of outside laboratories from October 2014 revealed unremarkable CBC and comprehensive metabolic panel. Patient is somewhat of a difficult historian (vague incomplete answers)  REVIEW OF SYSTEMS:  All non-GI ROS negative except for anxiety, back pain, depression, fatigue, headaches, heart murmur, shortness of breath, excessive thirst, excessive urination, urinary leakage  Past Medical History  Diagnosis Date  . Kidney disease     renal angiomyolipomas  . Tuberous sclerosis   . Bipolar disorder   . Cervical disc disease   . Hypertension   . Liver cyst   . Iron deficiency   . Depression   . Anxiety   . ADHD (attention deficit hyperactivity disorder)     Past Surgical History  Procedure Laterality Date  . Nephrectomy      Social History Melissa Ochoa  reports that she has never smoked. She has never used smokeless tobacco. She reports that she does not drink alcohol or use illicit drugs.  family  history includes Diabetes in an other family member; Lung cancer in her father.  No Known Allergies     PHYSICAL EXAMINATION: Vital signs: BP 112/62  Pulse 68  Ht 5\' 9"  (1.753 m)  Wt 191 lb (86.637 kg)  BMI 28.19 kg/m2  SpO2 98%  Constitutional: generally well-appearing, no acute distress Psychiatric: alert and oriented x3, cooperative Eyes: extraocular movements intact, anicteric, conjunctiva pink Mouth: oral pharynx moist, no lesions Neck: supple no lymphadenopathy Cardiovascular: heart regular rate and rhythm, no murmur Lungs: clear to auscultation bilaterally Abdomen: soft, nontender, nondistended, no obvious ascites, no peritoneal signs, normal bowel sounds, no organomegaly. Prior surgical incisions well-healed Rectal: Deferred until colonoscopy Extremities: no lower extremity edema bilaterally Skin: no lesions on visible extremities Neuro: No focal deficits. No asterixis.  ASSESSMENT:  #1. Chronic bloating. #2. Chronic constipation. May have IBS #3. Intermittent solid food dysphagia. Rule out stricture #4. Colon cancer screening. Appropriate candidate and she is nearly age 39 #5. Focal nodular hyperplasia, solitary #6. Multiple medical problems as outlined   PLAN:  #1. Serologic screening for celiac disease #2. Discussed with her that focal nodular hyperplasia is a benign regenerative condition. Generally does not need followup radiology, particularly when small, unless on estrogens, as in this case. Thus, would recommend routine followup MRI of the liver in 1 year to exclude progressive growth #3. Colonoscopy to provide colon cancer screening. Also will allow [place] to evaluate chronic lower GI complaints.The nature of the procedure, as well as the risks, benefits, and alternatives were carefully and thoroughly reviewed with the patient. Ample time for discussion and questions allowed. The patient understood, was satisfied, and  agreed to proceed. #4. Upper endoscopy with  possible esophageal dilation for dysphagia.The nature of the procedure, as well as the risks, benefits, and alternatives were carefully and thoroughly reviewed with the patient. Ample time for discussion and questions allowed. The patient understood, was satisfied, and agreed to proceed. #5. Further recommendations after the results from the above available

## 2013-08-29 ENCOUNTER — Telehealth: Payer: Self-pay

## 2013-08-29 ENCOUNTER — Encounter: Payer: Self-pay | Admitting: Internal Medicine

## 2013-08-29 LAB — TISSUE TRANSGLUTAMINASE, IGA: Tissue Transglutaminase Ab, IgA: 1 U/mL (ref <20)

## 2013-08-29 NOTE — Telephone Encounter (Signed)
Melissa Ochoa, let patient know that her test for celiac disease was normal. However, her serum IgA level was low (not necessarily a problem, though sometimes the celiac testing be falsely negative). We can resolve the issue when we do her upper endoscopy with small bowel biopsies. No worries tell her. Thanks ----- Message ----- From: Linna Hoff, RN Sent: 08/29/2013 1:11 PM To: Hilarie Fredrickson, MD    Spoke with pt and she is aware.

## 2013-10-02 ENCOUNTER — Telehealth: Payer: Self-pay | Admitting: Internal Medicine

## 2013-10-02 DIAGNOSIS — R142 Eructation: Secondary | ICD-10-CM

## 2013-10-02 DIAGNOSIS — R1310 Dysphagia, unspecified: Secondary | ICD-10-CM

## 2013-10-02 DIAGNOSIS — Z1211 Encounter for screening for malignant neoplasm of colon: Secondary | ICD-10-CM

## 2013-10-02 DIAGNOSIS — R143 Flatulence: Secondary | ICD-10-CM

## 2013-10-02 DIAGNOSIS — R141 Gas pain: Secondary | ICD-10-CM

## 2013-10-02 MED ORDER — MOVIPREP 100 G PO SOLR: 1.0000 | Freq: Once | ORAL | Status: DC

## 2013-10-02 NOTE — Telephone Encounter (Signed)
Resent Moviprep rx per patient's request

## 2013-10-04 ENCOUNTER — Telehealth: Payer: Self-pay | Admitting: Internal Medicine

## 2013-10-05 NOTE — Telephone Encounter (Signed)
Called patient and told her I would leave a Moviprep voucher up front for her to pick up.  Patient agreed

## 2013-10-10 ENCOUNTER — Encounter: Payer: Self-pay | Admitting: Internal Medicine

## 2013-10-10 ENCOUNTER — Ambulatory Visit (AMBULATORY_SURGERY_CENTER): Payer: Medicare Other | Admitting: Internal Medicine

## 2013-10-10 VITALS — BP 139/69 | HR 75 | Temp 98.4°F | Resp 19 | Ht 69.0 in | Wt 191.0 lb

## 2013-10-10 DIAGNOSIS — Z1211 Encounter for screening for malignant neoplasm of colon: Secondary | ICD-10-CM

## 2013-10-10 DIAGNOSIS — D126 Benign neoplasm of colon, unspecified: Secondary | ICD-10-CM

## 2013-10-10 DIAGNOSIS — R1310 Dysphagia, unspecified: Secondary | ICD-10-CM

## 2013-10-10 MED ORDER — SODIUM CHLORIDE 0.9 % IV SOLN: 500.0000 mL | INTRAVENOUS | Status: DC

## 2013-10-10 NOTE — Progress Notes (Signed)
Report to pacu rn, vss, bbs=clear 

## 2013-10-10 NOTE — Op Note (Signed)
[facility]  [address] [place], [address]   COLONOSCOPY PROCEDURE REPORT  PATIENT: Melissa Ochoa, Melissa Ochoa  MR#: [id number] BIRTHDATE: July 09, 1966 , 47  yrs. old GENDER: Female ENDOSCOPIST: Eustace Quail, MD REFERRED TK:[id number] Denice Paradise, M.D. PROCEDURE DATE:  10/10/2013 PROCEDURE:   Colonoscopy with snare polypectomy x3 First Screening Colonoscopy - Avg.  risk and is 50 yrs.  old or older Yes.  Prior Negative Screening - Now for repeat screening. N/A  History of Adenoma - Now for follow-up colonoscopy & has been > or = to 3 yrs.  N/A  Polyps Removed Today? Yes. ASA CLASS:   Class III INDICATIONS:average risk screening. MEDICATIONS: MAC sedation, administered by CRNA and propofol (Diprivan) 500mg  IV  DESCRIPTION OF PROCEDURE:   After the risks benefits and alternatives of the procedure were thoroughly explained, informed consent was obtained.  A digital rectal exam revealed no abnormalities of the rectum.   The LB [id number]  endoscope was introduced through the anus and advanced to the cecum, which was identified by both the appendix and ileocecal valve. No adverse events experienced.   The quality of the prep was good, using MoviPrep  The instrument was then slowly withdrawn as the colon was fully examined.  COLON FINDINGS: Moderate melanosis was found throughout the entire examined colon.   Three polyps ranging between 3-105mm in size were found in the ascending colon and sigmoid colon.  A polypectomy was performed with a cold snare.  The resection was complete and the polyp tissue was completely retrieved.   The colon mucosa was otherwise normal.  Retroflexed views revealed no abnormalities. The time to cecum=4 minutes 49 seconds.  Withdrawal time=19 minutes 04 seconds.  The scope was withdrawn and the procedure completed. COMPLICATIONS: There were no complications.  ENDOSCOPIC IMPRESSION: 1.   Moderate melanosis was found throughout the entire  examined colon 2.   Three polyps  were found in the ascending colon and sigmoid colon; polypectomy was performed with a cold snare 3.   The colon mucosa was otherwise normal  RECOMMENDATIONS: 1.  Repeat colonoscopy in 5 years if polyp adenomatous; otherwise 10 years 2.  Upper endoscopy today (see report) 3.  Linzess 290 mcg once daily (Samples provided)   eSigned:  Eustace Quail, MD 10/10/2013 2:25 PM   cc: Donato Heinz, MD, [name], MD, and The Patient   PATIENT NAME:  Melissa Ochoa, Melissa Ochoa MR#: [id number]

## 2013-10-10 NOTE — Patient Instructions (Addendum)
YOU HAD AN ENDOSCOPIC PROCEDURE TODAY AT THE [facility]: Refer to the procedure report that was given to you for any specific questions about what was found during the examination.  If the procedure report does not answer your questions, please call your gastroenterologist to clarify.  If you requested that your care partner not be given the details of your procedure findings, then the procedure report has been included in a sealed envelope for you to review at your convenience later.  YOU SHOULD EXPECT: Some feelings of bloating in the abdomen. Passage of more gas than usual.  Walking can help get rid of the air that was put into your GI tract during the procedure and reduce the bloating. If you had a lower endoscopy (such as a colonoscopy or flexible sigmoidoscopy) you may notice spotting of blood in your stool or on the toilet paper. If you underwent a bowel prep for your procedure, then you may not have a normal bowel movement for a few days.  DIET: Your first meal following the procedure should be a light meal and then it is ok to progress to your normal diet.  A half-sandwich or bowl of soup is an example of a good first meal.  Heavy or fried foods are harder to digest and may make you feel nauseous or bloated.  Likewise meals heavy in dairy and vegetables can cause extra gas to form and this can also increase the bloating.  Drink plenty of fluids but you should avoid alcoholic beverages for 24 hours.  ACTIVITY: Your care partner should take you home directly after the procedure.  You should plan to take it easy, moving slowly for the rest of the day.  You can resume normal activity the day after the procedure however you should NOT DRIVE or use heavy machinery for 24 hours (because of the sedation medicines used during the test).    SYMPTOMS TO REPORT IMMEDIATELY: A gastroenterologist can be reached at any hour.  During normal business hours, 8:30 AM to 5:00 PM Monday through Friday,  call [phone].  After hours and on weekends, please call the GI answering service at [phone] who will take a message and have the physician on call contact you.   Following lower endoscopy (colonoscopy or flexible sigmoidoscopy):  Excessive amounts of blood in the stool  Significant tenderness or worsening of abdominal pains  Swelling of the abdomen that is new, acute  Fever of 100F or higher  Following upper endoscopy (EGD)  Vomiting of blood or coffee ground material  New chest pain or pain under the shoulder blades  Painful or persistently difficult swallowing  New shortness of breath  Fever of 100F or higher  Black, tarry-looking stools  FOLLOW UP: If any biopsies were taken you will be contacted by phone or by letter within the next 1-3 weeks.  Call your gastroenterologist if you have not heard about the biopsies in 3 weeks.  Our staff will call the home number listed on your records the next business day following your procedure to check on you and address any questions or concerns that you may have at that time regarding the information given to you following your procedure. This is a courtesy call and so if there is no answer at the home number and we have not heard from you through the emergency physician on call, we will assume that you have returned to your regular daily activities without incident.  SIGNATURES/CONFIDENTIALITY: You and/or your care   partner have signed paperwork which will be entered into your electronic medical record.  These signatures attest to the fact that that the information above on your After Visit Summary has been reviewed and is understood.  Full responsibility of the confidentiality of this discharge information lies with you and/or your care-partner.  Polyps, hiatal hernia-handouts given  Repeat colonoscopy will be determined by pathology.  Linzess 290 mg once daily-samples given  Call office for office visit in 4-6 weeks.

## 2013-10-10 NOTE — Op Note (Addendum)
[facility]  [address]. [place], [address]   ENDOSCOPY PROCEDURE REPORT  PATIENT: Melissa, Ochoa  MR#: [id number] BIRTHDATE: 1966/06/25 , 47  yrs. old GENDER: Female ENDOSCOPIST: Eustace Quail, MD REFERRED BY:  Donetta Potts, M.D. PROCEDURE DATE:  10/10/2013 PROCEDURE:  EGD, diagnostic ASA CLASS:     Class II INDICATIONS:  Dyspepsia.   Dysphagia. MEDICATIONS: MAC sedation, administered by CRNA and propofol (Diprivan) 100mg  IV   . TOPICAL ANESTHETIC: Cetacaine Spray  DESCRIPTION OF PROCEDURE: After the risks benefits and alternatives of the procedure were thoroughly explained, informed consent was obtained.  The LB [id number] endoscope was introduced through the mouth and advanced to the second portion of the duodenum. Without limitations.  The instrument was slowly withdrawn as the mucosa was fully examined.     EXAM:  The upper, middle and distal third of the esophagus were carefully inspected and no abnormalities were noted.  The z-line was well seen at the GEJ.  The endoscope was pushed into the fundus which was normal including a retroflexed view.  The antrum, gastric body, first and second part of the duodenum were unremarkable. Retroflexed views revealed a hiatal hernia.     The scope was then withdrawn from the patient and the procedure completed.  COMPLICATIONS: There were no complications.  ENDOSCOPIC IMPRESSION: 1. Normal EGD  RECOMMENDATIONS: 1.  See colonoscopy Report recommendations 2.  Call for office visit to be seen in 4-6 weeks  REPEAT EXAM:  eSigned:  Eustace Quail, MD 10/10/2013 2:31 PM Revised: 10/10/2013 2:31 PM  NA:[id number] [name], MD, [name], MD, and The Patient

## 2013-10-10 NOTE — Progress Notes (Signed)
Called to room to assist during endoscopic procedure.  Patient ID and intended procedure confirmed with present staff. Received instructions for my participation in the procedure from the performing physician.  

## 2013-10-11 ENCOUNTER — Telehealth: Payer: Self-pay | Admitting: *Deleted

## 2013-10-11 NOTE — Telephone Encounter (Signed)
  Follow up Call-  Call back number 10/10/2013  Post procedure Call Back phone  # [phone]  Permission to leave phone message Yes     Patient questions:  Do you have a fever, pain , or abdominal swelling? no Pain Score  0 *  Have you tolerated food without any problems? yes  Have you been able to return to your normal activities? yes  Do you have any questions about your discharge instructions: Diet   no Medications  no Follow up visit  no  Do you have questions or concerns about your Care? no  Actions: * If pain score is 4 or above: No action needed, pain <4.

## 2013-10-16 ENCOUNTER — Encounter: Payer: Self-pay | Admitting: Internal Medicine

## 2013-10-30 ENCOUNTER — Telehealth: Payer: Self-pay | Admitting: Internal Medicine

## 2013-10-30 ENCOUNTER — Telehealth: Payer: Self-pay

## 2013-10-30 NOTE — Telephone Encounter (Signed)
Pt states she had an ECL 10/10/13 with Dr. Henrene Pastor. Pt states that after the procedure she developed mouth sores. She saw her PCP and was given an antibiotic and then developed thrush. Pt thinks all this is related to the EGD and she wanted to make sure Dr. Henrene Pastor was aware. Pt has follow up OV with Dr. Henrene Pastor in February.

## 2013-10-30 NOTE — Telephone Encounter (Signed)
Pt states she was given Linzess 290 mcg samples and that they did help with her bloating. Pt would like to have some more samples. Please advise.

## 2013-10-30 NOTE — Telephone Encounter (Signed)
Sure, if we have them. She will need a prescription called in for long term use.

## 2013-10-31 MED ORDER — LINACLOTIDE 290 MCG PO CAPS: 290.0000 ug | ORAL_CAPSULE | Freq: Every day | ORAL | Status: DC

## 2013-10-31 NOTE — Telephone Encounter (Signed)
Samples left up front for pt to pick up. Pt aware.

## 2013-11-15 ENCOUNTER — Encounter: Payer: Self-pay | Admitting: Internal Medicine

## 2013-11-15 ENCOUNTER — Ambulatory Visit (INDEPENDENT_AMBULATORY_CARE_PROVIDER_SITE_OTHER): Payer: Medicare Other | Admitting: Internal Medicine

## 2013-11-15 VITALS — BP 124/60 | HR 80 | Ht 69.0 in | Wt 193.0 lb

## 2013-11-15 DIAGNOSIS — K589 Irritable bowel syndrome without diarrhea: Secondary | ICD-10-CM

## 2013-11-15 DIAGNOSIS — R142 Eructation: Secondary | ICD-10-CM

## 2013-11-15 DIAGNOSIS — R143 Flatulence: Secondary | ICD-10-CM

## 2013-11-15 DIAGNOSIS — K120 Recurrent oral aphthae: Secondary | ICD-10-CM

## 2013-11-15 DIAGNOSIS — R141 Gas pain: Secondary | ICD-10-CM

## 2013-11-15 MED ORDER — LINACLOTIDE 290 MCG PO CAPS: 290.0000 ug | ORAL_CAPSULE | Freq: Every day | ORAL | Status: DC

## 2013-11-15 NOTE — Patient Instructions (Signed)
We have given you samples of the following medication to take: Linzess 290 mcg-Take 1 capsule by mouth daily. (If this works well, we will be glad to send a prescription to your pharmacy).

## 2013-11-15 NOTE — Progress Notes (Signed)
HISTORY OF PRESENT ILLNESS:  Melissa Ochoa is a 48 y.o. female  evaluated as a new patient 08/28/2013 regarding chronic constipation, chronic bloating, intermittent solid food dysphagia,: Cancer screening, and solitary focal nodular hyperplasia of the liver by imaging. Serologic testing for celiac disease was negative. Followup MRI of the liver in one year recommended. Colonoscopy and upper endoscopy performed on 10/10/2013. Colonoscopy revealed moderate melanosis coli and diminutive colon polyps which were sessile serrated and tubular adenomas. Upper endoscopy was normal. Followup colonoscopy in 5 years recommended. For her chronic constipation and bloating she was given samples of Linzess 290 mcg daily. She presents today regarding painful sores in her mouth. She tries to relate this to her upper endoscopy. She had no complaints greater than 48 hours after her endoscopy was completed. Subsequently developed mouth ulcers for which she saw her PCP January 19. Reports having had similar problems 8 years ago. Was treated with a number of agents without relief. Saw dentist could not identify the cause. Was sent back to this office. Appetite report that her abdominal complaints have improved with Linzess. She contacted the office earlier in the month requesting samples. Request additional samples today. No new GI issues  REVIEW OF SYSTEMS:  All non-GI ROS negative except for anxiety, cough, depression, fatigue, mouth sores, swollen glands,  Past Medical History  Diagnosis Date  . Kidney disease     renal angiomyolipomas  . Tuberous sclerosis   . Bipolar disorder   . Cervical disc disease   . Hypertension   . Liver cyst   . Iron deficiency   . Depression   . Anxiety   . ADHD (attention deficit hyperactivity disorder)   . Colon polyps     adenomatous  . Melanosis     Past Surgical History  Procedure Laterality Date  . Nephrectomy      Social History Melissa Ochoa  reports that she  has never smoked. She has never used smokeless tobacco. She reports that she does not drink alcohol or use illicit drugs.  family history includes Diabetes in an other family member; Lung cancer in her father.  No Known Allergies     PHYSICAL EXAMINATION: Vital signs: BP 124/60  Pulse 80  Ht 5\' 9"  (1.753 m)  Wt 193 lb (87.544 kg)  BMI 28.49 kg/m2 General: Well-developed, well-nourished, no acute distress HEENT: Sclerae are anicteric, conjunctiva pink. Oral mucosa with whitish plaque on the lateral aspect of tongue Abdomen: Not reexamined. Psychiatric: alert and oriented x3. Cooperative. Emotional/tearful   ASSESSMENT:  #1. Aphthous stomatitis. Question leukoplakia. I can in no way relate this to her diagnostic upper endoscopy. She reports similar problems 8 years ago. #2. Constipation predominant IBS. Improved with Linzess #3. Adenomatous colon polyps on recent colonoscopy #4. Focal nodular hyperplasia of the liver on imaging  PLAN:  #1. Referred back to PCP regarding mouth lesions. May need to see ENT. I did contact and speak with Dr. Brigitte Pulse personally, as promised. He we'll move forward with further evaluation. #2. Continue Linzess. Additional samples given, as requested #3. Surveillance colonoscopy in 5 years #4. Repeat liver MRI in 1 year. This can be done here or via her PCP, whatever she prefers. Interval followup as needed

## 2013-11-17 ENCOUNTER — Other Ambulatory Visit: Payer: Self-pay | Admitting: Otolaryngology

## 2014-02-09 ENCOUNTER — Telehealth: Payer: Self-pay | Admitting: Internal Medicine

## 2014-02-09 NOTE — Telephone Encounter (Signed)
LM on VM.

## 2014-02-15 ENCOUNTER — Other Ambulatory Visit (HOSPITAL_COMMUNITY): Payer: Self-pay | Admitting: Nephrology

## 2014-02-15 DIAGNOSIS — D1771 Benign lipomatous neoplasm of kidney: Secondary | ICD-10-CM

## 2014-02-27 ENCOUNTER — Ambulatory Visit (HOSPITAL_COMMUNITY)
Admission: RE | Admit: 2014-02-27 | Discharge: 2014-02-27 | Disposition: A | Discharge status: Home / Self Care | Payer: Medicare Other | Source: Ambulatory Visit | Attending: Nephrology | Admitting: Nephrology

## 2014-02-27 DIAGNOSIS — D1771 Benign lipomatous neoplasm of kidney: Secondary | ICD-10-CM

## 2014-02-27 DIAGNOSIS — N289 Disorder of kidney and ureter, unspecified: Secondary | ICD-10-CM | POA: Insufficient documentation

## 2014-02-27 LAB — POCT I-STAT CREATININE: CREATININE: 1.1 mg/dL (ref 0.50–1.10)

## 2014-02-27 MED ORDER — GADOXETATE DISODIUM 0.25 MMOL/ML IV SOLN
9.0000 mL | Freq: Once | INTRAVENOUS | Status: AC | PRN
  Administered 2014-02-27: 9 mL via INTRAVENOUS

## 2014-04-17 ENCOUNTER — Encounter: Payer: Self-pay | Admitting: Internal Medicine

## 2014-04-17 ENCOUNTER — Ambulatory Visit (INDEPENDENT_AMBULATORY_CARE_PROVIDER_SITE_OTHER): Payer: Medicare Other | Admitting: Internal Medicine

## 2014-04-17 VITALS — BP 137/88 | HR 84 | Temp 97.8°F | Wt 208.0 lb

## 2014-04-17 DIAGNOSIS — K120 Recurrent oral aphthae: Secondary | ICD-10-CM

## 2014-04-17 NOTE — Progress Notes (Addendum)
Subjective:    Patient ID: Melissa Ochoa, female    DOB: 06-22-1966, 48 y.o.   MRN: [id number]  HPI 48yo F with tuberous sclerosis of kidney s/p nephrectomy who reports having long standing mouth ulcer. The patient reports shortly after undergoing EGD and CSY in January 2015 she started to notice having painful white plaque on the side of her tongue and buccal mucosa. She was given valtrex and course of steroids that has some brief improvement. She was again seen in mid February where 2nd course of steroids did alleviate her symptoms partially. She was referred to dr. Redmond Baseman at [facility] who saw her in late February  At that time she complained of glossdynia x 5 wks.  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 No fungal hyphae seen no signs of autoimmune bullous disorder. She had seen dr bates again in early July where oral lesions looked improved, now c.w median rhomboid glossitis . She feels that she has a lump in the back of her tongue that is starting over the last 4 wks. She is referred to the ID clinic for further assistance in diagnosis and to weigh in if this was caused by her EGD procedure, specifically cre infection. She brings with her numerous photo of her mouth describing the course of changes in her tongue  No Known Allergies   Current Outpatient Prescriptions on File Prior to Visit  Medication Sig Dispense Refill  . amLODipine (NORVASC) 10 MG tablet Take 10 mg by mouth daily.      . hydrochlorothiazide (MICROZIDE) 12.5 MG capsule Take 12.5 mg by mouth daily.      Marland Kitchen labetalol (NORMODYNE) 200 MG tablet Take 200 mg by mouth 3 (three) times daily.      Marland Kitchen lamoTRIgine (LAMICTAL) 100 MG tablet       . Linaclotide (LINZESS) 290 MCG CAPS capsule Take 1 capsule (290 mcg total) by mouth daily.  35 capsule  0  . losartan (COZAAR) 100 MG tablet Take 100 mg by mouth daily.      .  norethindrone-ethinyl estradiol (JUNEL FE,GILDESS FE,LOESTRIN FE) 1-20 MG-MCG tablet Take 1 tablet by mouth daily.      . sertraline (ZOLOFT) 50 MG tablet        No current facility-administered medications on file prior to visit.   Active Ambulatory Problems    Diagnosis Date Noted  . No Active Ambulatory Problems   Resolved Ambulatory Problems    Diagnosis Date Noted  . No Resolved Ambulatory Problems   Past Medical History  Diagnosis Date  . Kidney disease   . Tuberous sclerosis   . Bipolar disorder   . Cervical disc disease   . Hypertension   . Liver cyst   . Iron deficiency   . Depression   . Anxiety   . ADHD (attention deficit hyperactivity disorder)   . Colon polyps   . Melanosis    History  Substance Use Topics  . Smoking status: Never Smoker   . Smokeless tobacco: Never Used  . Alcohol Use: No   family history includes Diabetes in an other family member; Lung cancer in her father.   Review of Systems + tongue discomfort, no dyshagia Review of Systems  Constitutional: Negative for fever, chills, diaphoresis, activity change, appetite change, fatigue and unexpected weight change.  HENT: Negative for congestion, sore throat, rhinorrhea, sneezing, trouble swallowing and sinus pressure.  Eyes: Negative for photophobia  and visual disturbance.  Respiratory: Negative for cough, chest tightness, shortness of breath, wheezing and stridor.  Cardiovascular: Negative for chest pain, palpitations and leg swelling.  Gastrointestinal: Negative for nausea, vomiting, abdominal pain, diarrhea, constipation, blood in stool, abdominal distention and anal bleeding.  Genitourinary: Negative for dysuria, hematuria, flank pain and difficulty urinating.  Musculoskeletal: Negative for myalgias, back pain, joint swelling, arthralgias and gait problem.  Skin: Negative for color change, pallor, rash and wound.  Neurological: Negative for dizziness, tremors, weakness and light-headedness.    Hematological: Negative for adenopathy. Does not bruise/bleed easily.  Psychiatric/Behavioral: Negative for behavioral problems, confusion, sleep disturbance, dysphoric mood, decreased concentration and agitation.       Objective:   Physical Exam BP 137/88  Pulse 84  Temp(Src) 97.8 F (36.6 C) (Oral)  Wt 208 lb (94.348 kg) Physical Exam  Constitutional:  oriented to person, place, and time. appears well-developed and well-nourished. No distress.  HENT: tongue has slight whitish coating on posterior 1/3rd unable to visualize any other abnormality. No buccal mucosa or gum line irritation Mouth/Throat: Oropharynx is clear and moist. No oropharyngeal exudate.   Lymphadenopathy: no cervical adenopathy.  Neurological: alert and oriented to person, place, and time.  Skin: Skin is warm and dry. No rash noted. No erythema.  Psychiatric: a normal mood and affect.  behavior is normal.      Assessment & Plan:  Tongue lesions previous path suggestive of hyperkeratosis = at this time, it appears mainly resolved. Unclear if the area she is complaining of is recurrence of her process. After reviewing literature, it appears that this process could be due to oral manifestations related to tuberous sclerosis complex. Some of the images that she showed me of gumline appear to be consistent. Tongue lesions can also be present vs Possibly could be aphthous ulcers possibly related ebv. Will check ebv viral load, although she has no systemic signs of infection. Will check immunoglobulins to see if she has a deficiency as possible cause for not healing quickly from this process. Will check hiv as part of routine healthcare. Recommend to [facility] to their adult [place] clinic to see if dermatology referral needed if present area worsens.Appears to respond to steroids.   egd procedure/cre infection = I mentioned to the patient that it is highly unlikely/quite impossible that she obtained a cre infection from her  endoscopic procedures. 1) cre infection that were publicized in the press were due to several outbreaks related to ercp scopes which she did not have. Also thought it was unlikely she got a bacterial infection from egd since scopes are cleaned, they generally do not persist for 6 months without worsening while on steroids.  Will try to refer to tuberous sclerosis clinic at [facility]

## 2014-04-17 NOTE — Addendum Note (Signed)
Addended by: Dolan Amen D on: 04/17/2014 02:43 PM   Modules accepted: Orders

## 2014-04-18 LAB — IGG, IGA, IGM
IgA: 17 mg/dL — ABNORMAL LOW (ref 69–380)
IgG (Immunoglobin G), Serum: 428 mg/dL — ABNORMAL LOW (ref 690–1700)
IgM, Serum: 32 mg/dL — ABNORMAL LOW (ref 52–322)

## 2014-04-18 LAB — IGE: IgE (Immunoglobulin E), Serum: <1.5 IU/mL (ref 0.0–180.0)

## 2014-04-19 ENCOUNTER — Encounter: Payer: Self-pay | Admitting: *Deleted

## 2014-04-19 DIAGNOSIS — K120 Recurrent oral aphthae: Secondary | ICD-10-CM | POA: Insufficient documentation

## 2014-04-23 ENCOUNTER — Telehealth: Payer: Self-pay | Admitting: *Deleted

## 2014-04-23 NOTE — Telephone Encounter (Signed)
Per Dr Baxter Flattery called [facility] to get the patient an appt. Was given 05/22/14 at 1:15pm as the earliest. Gave the patient information and they will send the patient a packet with appt and directions and time. Given fax number [phone] to fax notes to.

## 2014-04-24 NOTE — Telephone Encounter (Signed)
Patient's appointment has been rescheduled with Dr. Jacelyn Grip at [facility] for 06/22/14 at 10:00am  [facility] is notifying patient.

## 2014-05-17 ENCOUNTER — Ambulatory Visit (INDEPENDENT_AMBULATORY_CARE_PROVIDER_SITE_OTHER): Payer: No Typology Code available for payment source | Admitting: Psychiatry

## 2014-05-17 ENCOUNTER — Encounter (HOSPITAL_COMMUNITY): Payer: Self-pay | Admitting: Psychiatry

## 2014-05-17 VITALS — BP 110/76 | HR 86 | Ht 69.0 in | Wt 199.0 lb

## 2014-05-17 DIAGNOSIS — F3132 Bipolar disorder, current episode depressed, moderate: Secondary | ICD-10-CM | POA: Insufficient documentation

## 2014-05-17 DIAGNOSIS — F3130 Bipolar disorder, current episode depressed, mild or moderate severity, unspecified: Secondary | ICD-10-CM

## 2014-05-17 DIAGNOSIS — F988 Other specified behavioral and emotional disorders with onset usually occurring in childhood and adolescence: Secondary | ICD-10-CM | POA: Insufficient documentation

## 2014-05-17 DIAGNOSIS — F411 Generalized anxiety disorder: Secondary | ICD-10-CM

## 2014-05-17 MED ORDER — SERTRALINE HCL 100 MG PO TABS: 100.0000 mg | ORAL_TABLET | Freq: Every day | ORAL | Status: DC
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
## 2014-05-21 ENCOUNTER — Telehealth (HOSPITAL_COMMUNITY): Payer: Self-pay

## 2014-05-21 DIAGNOSIS — F319 Bipolar disorder, unspecified: Secondary | ICD-10-CM

## 2014-05-22 MED ORDER — LAMOTRIGINE 100 MG PO TABS: 300.0000 mg | ORAL_TABLET | Freq: Every day | ORAL | Status: DC

## 2014-05-22 MED ORDER — ALPRAZOLAM 1 MG PO TABS: 1.0000 mg | ORAL_TABLET | Freq: Three times a day (TID) | ORAL | Status: DC | PRN

## 2014-05-22 MED ORDER — QUETIAPINE FUMARATE 100 MG PO TABS: 150.0000 mg | ORAL_TABLET | Freq: Every day | ORAL | Status: DC

## 2014-05-22 NOTE — Telephone Encounter (Signed)
Pt reports she went thru her meds yesterday and needs refills on Seroquel, Lamictal and Xanax   A/P: Bipolar disorder- currently depressed, GAD and ADD and possibly OCD  1. Zoloft to 100mg  po qD for depression and anxiety  2. Seroquel 150mg  po qHS for mood lability  3. Lamtical 100mg  TID for mood lability 4.Xanax 1mg  po TID prn anxiety  5. Adderall 20mg  po BID for ADHD

## 2014-05-23 ENCOUNTER — Telehealth (HOSPITAL_COMMUNITY): Payer: Self-pay

## 2014-05-23 NOTE — Telephone Encounter (Signed)
Tene picked up prescription on 05/23/2014  DL  [id number]  dlo

## 2014-06-18 ENCOUNTER — Other Ambulatory Visit (HOSPITAL_COMMUNITY): Payer: Self-pay | Admitting: Psychiatry

## 2014-06-18 ENCOUNTER — Telehealth (HOSPITAL_COMMUNITY): Payer: Self-pay | Admitting: *Deleted

## 2014-06-18 DIAGNOSIS — F988 Other specified behavioral and emotional disorders with onset usually occurring in childhood and adolescence: Secondary | ICD-10-CM

## 2014-06-18 MED ORDER — AMPHETAMINE-DEXTROAMPHETAMINE 20 MG PO TABS: 20.0000 mg | ORAL_TABLET | Freq: Two times a day (BID) | ORAL | Status: DC

## 2014-06-18 NOTE — Telephone Encounter (Signed)
Patient left message: Requests prescription for  Amphetamine Salts (Adderall) 20 mg, takes twice a day. States MD knows she takes it, but has never prescribed it for her.  Phoned patient - left message: As this is a new prescription and not a refill of an active prescription, will need to be initiated by MD. MD will be in on 9/22.Office will contact her then

## 2014-06-25 ENCOUNTER — Other Ambulatory Visit (HOSPITAL_COMMUNITY): Payer: Self-pay | Admitting: Respiratory Therapy

## 2014-06-25 DIAGNOSIS — Q851 Tuberous sclerosis: Secondary | ICD-10-CM

## 2014-06-26 ENCOUNTER — Encounter (HOSPITAL_COMMUNITY): Payer: Self-pay

## 2014-06-27 ENCOUNTER — Other Ambulatory Visit (HOSPITAL_COMMUNITY): Payer: Self-pay | Admitting: *Deleted

## 2014-06-27 ENCOUNTER — Telehealth (HOSPITAL_COMMUNITY): Payer: Self-pay | Admitting: *Deleted

## 2014-06-27 DIAGNOSIS — F988 Other specified behavioral and emotional disorders with onset usually occurring in childhood and adolescence: Secondary | ICD-10-CM

## 2014-06-27 MED ORDER — AMPHETAMINE-DEXTROAMPHETAMINE 20 MG PO TABS: 20.0000 mg | ORAL_TABLET | Freq: Two times a day (BID) | ORAL | Status: DC

## 2014-06-27 NOTE — Telephone Encounter (Signed)
RX for Adderall noted as "Print" on 06/18/14 never printed. 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 Never received by patient.

## 2014-07-03 ENCOUNTER — Telehealth (HOSPITAL_COMMUNITY): Payer: Self-pay

## 2014-07-03 ENCOUNTER — Ambulatory Visit (HOSPITAL_COMMUNITY)
Admission: RE | Admit: 2014-07-03 | Discharge: 2014-07-03 | Disposition: A | Discharge status: Home / Self Care | Payer: Medicare Other | Source: Ambulatory Visit | Attending: Neurology | Admitting: Neurology

## 2014-07-03 DIAGNOSIS — R0600 Dyspnea, unspecified: Secondary | ICD-10-CM | POA: Insufficient documentation

## 2014-07-03 DIAGNOSIS — Q851 Tuberous sclerosis: Secondary | ICD-10-CM | POA: Diagnosis present

## 2014-07-03 MED ORDER — ALBUTEROL SULFATE (2.5 MG/3ML) 0.083% IN NEBU
2.5000 mg | INHALATION_SOLUTION | Freq: Once | RESPIRATORY_TRACT | Status: AC
  Administered 2014-07-03: 2.5 mg via RESPIRATORY_TRACT

## 2014-07-03 NOTE — Telephone Encounter (Signed)
Patient picked up prescription on 07/03/14 dlo  Dl [id number]

## 2014-07-04 ENCOUNTER — Other Ambulatory Visit (HOSPITAL_COMMUNITY): Payer: Self-pay | Admitting: Neurology

## 2014-07-04 DIAGNOSIS — Q851 Tuberous sclerosis: Secondary | ICD-10-CM

## 2014-07-05 ENCOUNTER — Ambulatory Visit (HOSPITAL_COMMUNITY): Payer: Medicare Other | Admitting: Psychiatry

## 2014-07-06 ENCOUNTER — Encounter (HOSPITAL_COMMUNITY): Payer: Self-pay

## 2014-07-06 ENCOUNTER — Ambulatory Visit (HOSPITAL_COMMUNITY)
Admission: RE | Admit: 2014-07-06 | Discharge: 2014-07-06 | Disposition: A | Discharge status: Home / Self Care | Payer: Medicare Other | Source: Ambulatory Visit | Attending: Neurology | Admitting: Neurology

## 2014-07-06 DIAGNOSIS — Q851 Tuberous sclerosis: Secondary | ICD-10-CM | POA: Diagnosis present

## 2014-07-06 DIAGNOSIS — R0602 Shortness of breath: Secondary | ICD-10-CM | POA: Diagnosis not present

## 2014-07-06 LAB — PULMONARY FUNCTION TEST
DL/VA % PRED: 95 %
DL/VA: 5.12 ml/min/mmHg/L
DLCO COR % PRED: 86 %
DLCO UNC % PRED: 86 %
DLCO UNC: 26.7 ml/min/mmHg
DLCO cor: 26.7 ml/min/mmHg
FEF 25-75 POST: 2.62 L/s
FEF 25-75 Pre: 2.61 L/sec
FEF2575-%Change-Post: 0 %
FEF2575-%PRED-POST: 85 %
FEF2575-%PRED-PRE: 85 %
FEV1-%Change-Post: 0 %
FEV1-%PRED-PRE: 85 %
FEV1-%Pred-Post: 85 %
FEV1-PRE: 2.81 L
FEV1-Post: 2.84 L
FEV1FVC-%Change-Post: 4 %
FEV1FVC-%Pred-Pre: 98 %
FEV6-%Change-Post: -3 %
FEV6-%PRED-POST: 84 %
FEV6-%Pred-Pre: 87 %
FEV6-PRE: 3.58 L
FEV6-Post: 3.46 L
FEV6FVC-%Pred-Post: 103 %
FEV6FVC-%Pred-Pre: 103 %
FVC-%CHANGE-POST: -3 %
FVC-%Pred-Post: 82 %
FVC-%Pred-Pre: 85 %
FVC-POST: 3.46 L
FVC-Pre: 3.58 L
POST FEV6/FVC RATIO: 100 %
Post FEV1/FVC ratio: 82 %
Pre FEV1/FVC ratio: 79 %
Pre FEV6/FVC Ratio: 100 %
RV % pred: 75 %
RV: 1.52 L
TLC % pred: 88 %
TLC: 5.11 L

## 2014-07-09 ENCOUNTER — Ambulatory Visit (HOSPITAL_COMMUNITY): Payer: Medicare Other | Admitting: Psychiatry

## 2014-07-17 ENCOUNTER — Encounter (HOSPITAL_COMMUNITY): Payer: Self-pay | Admitting: Psychiatry

## 2014-07-17 ENCOUNTER — Ambulatory Visit (INDEPENDENT_AMBULATORY_CARE_PROVIDER_SITE_OTHER): Payer: No Typology Code available for payment source | Admitting: Psychiatry

## 2014-07-17 VITALS — BP 131/82 | HR 78 | Ht 69.0 in | Wt 204.0 lb

## 2014-07-17 DIAGNOSIS — F3130 Bipolar disorder, current episode depressed, mild or moderate severity, unspecified: Secondary | ICD-10-CM

## 2014-07-17 DIAGNOSIS — F3132 Bipolar disorder, current episode depressed, moderate: Secondary | ICD-10-CM

## 2014-07-17 DIAGNOSIS — F411 Generalized anxiety disorder: Secondary | ICD-10-CM

## 2014-07-17 DIAGNOSIS — F900 Attention-deficit hyperactivity disorder, predominantly inattentive type: Secondary | ICD-10-CM

## 2014-07-17 DIAGNOSIS — F988 Other specified behavioral and emotional disorders with onset usually occurring in childhood and adolescence: Secondary | ICD-10-CM

## 2014-07-17 MED ORDER — AMPHETAMINE-DEXTROAMPHETAMINE 20 MG PO TABS: 20.0000 mg | ORAL_TABLET | Freq: Two times a day (BID) | ORAL | Status: DC

## 2014-07-17 MED ORDER — ALPRAZOLAM 1 MG PO TABS: 1.0000 mg | ORAL_TABLET | Freq: Three times a day (TID) | ORAL | Status: DC | PRN

## 2014-07-17 MED ORDER — SERTRALINE HCL 100 MG PO TABS: 100.0000 mg | ORAL_TABLET | Freq: Every day | ORAL | Status: DC

## 2014-07-17 MED ORDER — LAMOTRIGINE 100 MG PO TABS: 300.0000 mg | ORAL_TABLET | Freq: Every day | ORAL | Status: DC

## 2014-07-17 MED ORDER — QUETIAPINE FUMARATE 200 MG PO TABS: 200.0000 mg | ORAL_TABLET | Freq: Every day | ORAL | Status: DC

## 2014-07-17 NOTE — Progress Notes (Signed)
[facility] Progress Note  Melissa Ochoa [id number] 48 y.o.  07/17/2014 2:10 PM  Chief Complaint: "overall doing ok"  History of Present Illness: Using fitbit and she is tracking her sleep. Pt is getting about 6-7 hrs. She is waking up multiple times a night. Energy is low. Seroquel is helping her to sleep and "knocks me right out". Pt is feeling overwhelmed and has so much going on.  Pt states depression continues and only feels good for one or 2 days a week. Appetite is ok. Concentration is poor and she is easily distracted. Adderall does help her focus.  Endorsing hopelessness and anhedonia.   Last week she had one day of hypomanic like symptoms- she had increased irritability, decreased sleep, "felt on the go".  No change in anxiety.  Pt takes meds as prescribed and denies SE.    Suicidal Ideation: Yes on/off but wants to live for her kids Plan Formed: No Patient has means to carry out plan: No  Homicidal Ideation: No Plan Formed: No Patient has means to carry out plan: No  Review of Systems: Psychiatric: Agitation: Yes Hallucination: No Depressed Mood: Yes Insomnia: No Hypersomnia: Yes Altered Concentration: Yes Feels Worthless: Yes Grandiose Ideas: Yes Belief In Special Powers: No New/Increased Substance Abuse: No Compulsions: No  Neurologic: Headache: No Seizure: No Paresthesias: No  Review of Systems  Constitutional: Positive for malaise/fatigue. Negative for fever and chills.  HENT: Negative for sore throat.   Eyes: Negative for redness.  Respiratory: Negative for cough and shortness of breath.   Cardiovascular: Negative for chest pain and leg swelling.  Gastrointestinal: Positive for nausea and diarrhea. Negative for heartburn, vomiting and constipation.  Musculoskeletal: Positive for back pain. Negative for joint pain and neck pain.  Skin: Positive for rash.  Neurological: Positive for headaches. Negative for sensory change,  focal weakness and seizures.  Psychiatric/Behavioral: Positive for depression. Negative for suicidal ideas, hallucinations and substance abuse. The patient is nervous/anxious. The patient does not have insomnia.      Past Medical Family, Social History: Married with kids.  reports that she has never smoked. She has never used smokeless tobacco. She reports that she does not drink alcohol or use illicit drugs.  Family History  Problem Relation Age of Onset  . Lung cancer Father     smoker  . Diabetes      a lot of relatives  . Anxiety disorder Mother   . ADD / ADHD Mother   . Depression Mother   . Suicidality Neg Hx   . ADD / ADHD Daughter   . Bipolar disorder Daughter    Past Medical History  Diagnosis Date  . Kidney disease     renal angiomyolipomas  . Tuberous sclerosis   . Bipolar disorder   . Cervical disc disease   . Hypertension   . Liver cyst   . Iron deficiency   . Depression   . Anxiety   . ADHD (attention deficit hyperactivity disorder)   . Colon polyps     adenomatous  . Melanosis     Outpatient Encounter Prescriptions as of 07/17/2014  Medication Sig  . ALPRAZolam (XANAX) 1 MG tablet Take 1 tablet (1 mg total) by mouth 3 (three) times daily as needed for anxiety.  Marland Kitchen amLODipine (NORVASC) 10 MG tablet Take 10 mg by mouth daily.  Marland Kitchen amphetamine-dextroamphetamine (ADDERALL) 20 MG tablet Take 1 tablet (20 mg total) by mouth 2 (two) times daily.  . hydrochlorothiazide (MICROZIDE) 12.5 MG  capsule Take 12.5 mg by mouth daily.  Marland Kitchen labetalol (NORMODYNE) 200 MG tablet Take 200 mg by mouth 3 (three) times daily.  Marland Kitchen lamoTRIgine (LAMICTAL) 100 MG tablet Take 3 tablets (300 mg total) by mouth daily.  . Linaclotide (LINZESS) 290 MCG CAPS capsule Take 1 capsule (290 mcg total) by mouth daily.  Marland Kitchen losartan (COZAAR) 100 MG tablet Take 100 mg by mouth daily.  . norethindrone-ethinyl estradiol (JUNEL FE,GILDESS FE,LOESTRIN FE) 1-20 MG-MCG tablet Take 1 tablet by mouth daily.  .  QUEtiapine (SEROQUEL) 100 MG tablet Take 1.5 tablets (150 mg total) by mouth at bedtime.  . sertraline (ZOLOFT) 100 MG tablet Take 1 tablet (100 mg total) by mouth daily.    Past Psychiatric History/Hospitalization(s): Anxiety: Yes Bipolar Disorder: Yes Depression: Yes Mania: Yes Psychosis: No Schizophrenia: No Personality Disorder: No Hospitalization for psychiatric illness: No History of Electroconvulsive Shock Therapy: No Prior Suicide Attempts: No  Physical Exam: Constitutional:  BP 131/82  Pulse 78  Ht 5\' 9"  (1.753 m)  Wt 204 lb (92.534 kg)  BMI 30.11 kg/m2  General Appearance: alert, oriented, no acute distress  Musculoskeletal: Strength & Muscle Tone: within normal limits Gait & Station: normal Patient leans: N/A  Mental Status Examination/Evaluation: Objective: Attitude: Calm and cooperative  Appearance: Fairly Groomed, appears to be stated age  Eye Contact::  Good  Speech:  Clear and Coherent and Normal Rate  Volume:  Normal  Mood:  depressed  Affect:  Full Range  Thought Process:  Circumstantial  Orientation:  Full (Time, Place, and Person)  Thought Content:  Negative  Suicidal Thoughts:  Yes.  without intent/plan  Homicidal Thoughts:  No  Judgement:  Fair  Insight:  Fair  Concentration: good  Memory: Immediate-fair Recent-fair Remote-fair  Recall: fair  Language: fair  Gait and Station: normal  [employer] of Knowledge: average  Psychomotor Activity:  Normal  Akathisia:  No  Handed:  Right  AIMS (if indicated):  n/a   Assets:  Armed forces logistics/support/administrative officer Desire for Improvement Financial Resources/Insurance Housing Intimacy Leisure Time Social Support Engineer, drilling (Choose Three): Review of Psycho-Social Stressors (1), Review or order clinical lab tests (1), Established Problem, Worsening (2), Review of Medication Regimen & Side Effects (2) and Review of New Medication or Change in Dosage  (2)  Assessment: AXIS I  Bipolar disorder- currently depressed- moderate and recurrent, GAD and ADD and possibly OCD   AXIS II  Deferred   AXIS III  Past Medical History    Diagnosis  Date    .  Kidney disease       renal angiomyolipomas    .  Tuberous sclerosis     .  Bipolar disorder     .  Cervical disc disease     .  Hypertension     .  Liver cyst     .  Iron deficiency     .  Depression     .  Anxiety     .  ADHD (attention deficit hyperactivity disorder)     .  Colon polyps       adenomatous    .  Melanosis    AXIS IV  health stressors   AXIS V  51-60 moderate symptoms      Treatment Plan/Recommendations:  Plan of Care: Medication management with supportive therapy. Risks/benefits and SE of the medication discussed. Pt verbalized understanding and verbal consent obtained for treatment. Affirm with the patient that the  medications are taken as ordered. Patient expressed understanding of how their medications were to be used.   -pt has signed a Medical release to obtain records from her previous psychiatrist   Laboratory: labs CBC, CMP, HbA1c, lipid panel and prolactin at next visit   ekg- order at next visit  Psychotherapy: Therapy: brief supportive therapy provided. Discussed psychosocial stressors in detail.   Medications: Zoloft 100mg  po qD for depression and anxiety  Increase Seroquel to 200mg  po qHS for mood lability  Lamtical 100mg  TID for mood lability  Xanax 1mg  po TID prn anxiety  Adderall 20mg  po BID for ADHD   Routine PRN Medications: No   Consultations: none at this time   Safety Concerns: Pt denies SI and is at an acute low risk for suicide.Patient told to call clinic if any problems occur. Patient advised to go to ER if they should develop SI/HI, side effects, or if symptoms worsen. Has crisis numbers to call if needed. Pt verbalized understanding.   Other: F/up in 3 months or sooner if needed    Charlcie Cradle, MD 07/17/2014

## 2014-08-16 ENCOUNTER — Telehealth (HOSPITAL_COMMUNITY): Payer: Self-pay | Admitting: *Deleted

## 2014-08-16 DIAGNOSIS — F988 Other specified behavioral and emotional disorders with onset usually occurring in childhood and adolescence: Secondary | ICD-10-CM

## 2014-08-16 DIAGNOSIS — F3132 Bipolar disorder, current episode depressed, moderate: Secondary | ICD-10-CM

## 2014-08-16 MED ORDER — AMPHETAMINE-DEXTROAMPHETAMINE 20 MG PO TABS
20.0000 mg | ORAL_TABLET | Freq: Two times a day (BID) | ORAL | Status: DC
Start: 2014-08-16 — End: 2014-09-17

## 2014-08-16 MED ORDER — SERTRALINE HCL 100 MG PO TABS: 100.0000 mg | ORAL_TABLET | Freq: Every day | ORAL | Status: DC

## 2014-08-16 NOTE — Telephone Encounter (Signed)
Patient called office - let message - she currently takes 100 mg of Sertraline daily. Phoned pt, left message - [facility] RX for Adderall will be available for pick up this afternoon in office

## 2014-08-16 NOTE — Telephone Encounter (Signed)
Patient left [id number] refill of Sertraline 50 mg and Adderall.States can't use [facility], not enough time, [facility] [address] Phoned pt - left message: Per chart review, Sertraline dose ordered is 100 mg daily.Please call back to confirm current dose. [facility]. Adderall must be written and picked up at office.

## 2014-08-17 ENCOUNTER — Telehealth (HOSPITAL_COMMUNITY): Payer: Self-pay

## 2014-08-17 NOTE — Telephone Encounter (Signed)
08/17/14 3:21pm Patient PP#[id number] came and pick-up rx script.Marland KitchenMariana Ochoa

## 2014-09-17 ENCOUNTER — Telehealth (HOSPITAL_COMMUNITY): Payer: Self-pay | Admitting: *Deleted

## 2014-09-17 DIAGNOSIS — F988 Other specified behavioral and emotional disorders with onset usually occurring in childhood and adolescence: Secondary | ICD-10-CM

## 2014-09-17 MED ORDER — AMPHETAMINE-DEXTROAMPHETAMINE 20 MG PO TABS: 20.0000 mg | ORAL_TABLET | Freq: Two times a day (BID) | ORAL | Status: DC

## 2014-09-17 NOTE — Telephone Encounter (Signed)
DP:[id number] refill of Amphetamine Salts  Phoned patient - informed her MD will be in tomorrow 09/18/14 and will sign RX. Verbalized understanding.

## 2014-10-05 ENCOUNTER — Telehealth (HOSPITAL_COMMUNITY): Payer: Self-pay | Admitting: *Deleted

## 2014-10-05 DIAGNOSIS — F988 Other specified behavioral and emotional disorders with onset usually occurring in childhood and adolescence: Secondary | ICD-10-CM

## 2014-10-05 NOTE — Telephone Encounter (Signed)
Pt left message stating she is needing a refill of Adderall in 1 week but would like the dosage increased to 30mg . She is having issues with concentration and focus.

## 2014-10-09 MED ORDER — AMPHETAMINE-DEXTROAMPHETAMINE 30 MG PO TABS: 30.0000 mg | ORAL_TABLET | Freq: Two times a day (BID) | ORAL | Status: DC

## 2014-10-09 NOTE — Telephone Encounter (Signed)
Pt states she thinks it would benefit from an increase in Adderall. At current dose she is easily distracted and unable to complete tasks.  A/P: ADD Increase Adderall to 30mg  BID

## 2014-10-09 NOTE — Addendum Note (Signed)
Addended by: Charlcie Cradle on: 10/09/2014 09:04 AM   Modules accepted: Orders

## 2014-10-10 ENCOUNTER — Telehealth (HOSPITAL_COMMUNITY): Payer: Self-pay

## 2014-10-10 NOTE — Telephone Encounter (Signed)
Patient picked up prescription on 10-10-2014 at 2:45 am. Driver License is [id number]

## 2014-10-12 ENCOUNTER — Other Ambulatory Visit (HOSPITAL_COMMUNITY): Payer: Self-pay

## 2014-10-12 NOTE — Telephone Encounter (Signed)
Patient requests refills of [facility]ft from [facility] [address] [facility] and for Seroquel from [facility] prior to her coming in for appointment on 10/18/14 as states she likes to make sure she has medications ordered ahead of time.  States she thinks she has at least 4-5 days left of Zoloft and not sure about Seroquel but gets that from a pharmacy that mails her medication and wants filled prior to appointment.  States not home currently and cannot remember exactly how much of each medication she has so will count and call back at the beginning of next week if not enough medications to last until seen and to get her through until would be delivered.

## 2014-10-15 ENCOUNTER — Other Ambulatory Visit (HOSPITAL_COMMUNITY): Payer: Self-pay | Admitting: Psychiatry

## 2014-10-18 ENCOUNTER — Ambulatory Visit (HOSPITAL_COMMUNITY): Payer: Medicare Other | Admitting: Psychiatry

## 2014-10-29 ENCOUNTER — Telehealth (HOSPITAL_COMMUNITY): Payer: Self-pay

## 2014-10-29 NOTE — Telephone Encounter (Signed)
Telephone call with patient to follow up on a message she left requesting a refill of her Sertraline.  Patient no showed for evaluation on 10/18/14 and stated she did not come "because of the weather".  Informed patient of need to reschedule an appointment for as soon as possible as it appears patient will be running out of all other medications by 11/17/14.  Patient reluctant to make another appointment at this time but agreed to 11/01/14 at 4:15pm.  Patient will call back if she cannot keep this appointment with Dr. Doyne Keel.  States she has been out of Zoloft for 5 days and would like Dr. Doyne Keel to go ahead with this refill prior to coming in.  Informed would request this refill but again encouraged patient to keep rescheduled appointment for 11/01/14.

## 2014-10-30 ENCOUNTER — Ambulatory Visit (INDEPENDENT_AMBULATORY_CARE_PROVIDER_SITE_OTHER): Payer: No Typology Code available for payment source | Admitting: Psychiatry

## 2014-10-30 ENCOUNTER — Encounter (HOSPITAL_COMMUNITY): Payer: Self-pay | Admitting: Psychiatry

## 2014-10-30 VITALS — BP 149/87 | HR 96 | Ht 69.0 in | Wt 208.4 lb

## 2014-10-30 DIAGNOSIS — F3130 Bipolar disorder, current episode depressed, mild or moderate severity, unspecified: Secondary | ICD-10-CM | POA: Diagnosis not present

## 2014-10-30 DIAGNOSIS — F411 Generalized anxiety disorder: Secondary | ICD-10-CM

## 2014-10-30 DIAGNOSIS — F988 Other specified behavioral and emotional disorders with onset usually occurring in childhood and adolescence: Secondary | ICD-10-CM

## 2014-10-30 DIAGNOSIS — F900 Attention-deficit hyperactivity disorder, predominantly inattentive type: Secondary | ICD-10-CM

## 2014-10-30 DIAGNOSIS — F3132 Bipolar disorder, current episode depressed, moderate: Secondary | ICD-10-CM

## 2014-10-30 DIAGNOSIS — Z0000 Encounter for general adult medical examination without abnormal findings: Secondary | ICD-10-CM

## 2014-10-30 MED ORDER — AMPHETAMINE-DEXTROAMPHETAMINE 30 MG PO TABS: 30.0000 mg | ORAL_TABLET | Freq: Two times a day (BID) | ORAL | Status: DC

## 2014-10-30 MED ORDER — SERTRALINE HCL 100 MG PO TABS
150.0000 mg | ORAL_TABLET | Freq: Every day | ORAL | Status: DC
Start: 2014-10-30 — End: 2015-01-31

## 2014-10-30 MED ORDER — LAMOTRIGINE 100 MG PO TABS: 300.0000 mg | ORAL_TABLET | Freq: Every day | ORAL | Status: DC

## 2014-10-30 MED ORDER — QUETIAPINE FUMARATE 200 MG PO TABS: 200.0000 mg | ORAL_TABLET | Freq: Every day | ORAL | Status: DC

## 2014-10-30 MED ORDER — ALPRAZOLAM 1 MG PO TABS: 1.0000 mg | ORAL_TABLET | Freq: Three times a day (TID) | ORAL | Status: DC | PRN

## 2014-10-30 NOTE — Patient Instructions (Signed)
Call [phone] for EKG

## 2014-10-30 NOTE — Progress Notes (Signed)
[facility] [phone] Progress Note  Melissa Ochoa [id number] 49 y.o.  10/30/2014 4:01 PM  Chief Complaint: "pain is bad today"  History of Present Illness: Pt ran out of meds 5 days ago.   Irritability is high and she is snapping at family. Denies manic and hypomanic symptoms including periods of decreased need for sleep, increased energy, mood lability, impulsivity, FOI, and excessive spending.  Last 2 nights sleep has improved but prior it was broken.  Pt wass getting about 6-8 hrs prior to running out of meds. She is waking up multiple times a night. Energy is low and she tires easily.Pt does not nap during the day. Seroquel is helping her to sleep.  Pt doesn't have desire to do much lately and is endorsing some anhedonia. This week has been crying more. Pt states depression continues and only feels good for one or 2 days a week. Appetite is ok. Concentration is poor and she is easily distracted. Adderall does help her focus.  Endorsing hopelessness and worthlessness. Her health stressors contribute to her depression.   Anxiety is high and she feels tightness in her chest. Pt feels anxious some days. It causes body aches.   Pt takes meds as prescribed and denies SE.    Suicidal Ideation: Yes on/off this week but wants to live for her kids Plan Formed: No Patient has means to carry out plan: No  Homicidal Ideation: No Plan Formed: No Patient has means to carry out plan: No  Review of Systems: Psychiatric: Agitation: Yes Hallucination: No Depressed Mood: Yes Insomnia: Yes Hypersomnia: Yes Altered Concentration: Yes Feels Worthless: Yes Grandiose Ideas: No Belief In Special Powers: No New/Increased Substance Abuse: No Compulsions: No  Neurologic: Headache: No Seizure: No Paresthesias: No  Review of Systems  Constitutional: Negative for fever and chills.  HENT: Negative for congestion and sore throat.   Eyes: Negative for blurred vision, double vision and  redness.  Respiratory: Negative for cough, sputum production and shortness of breath.   Cardiovascular: Negative for chest pain, palpitations and leg swelling.  Gastrointestinal: Negative for abdominal pain, diarrhea, constipation and blood in stool.  Musculoskeletal: Positive for myalgias. Negative for back pain, joint pain and neck pain.  Skin: Negative for itching and rash.  Neurological: Negative for dizziness, seizures, loss of consciousness, weakness and headaches.  Psychiatric/Behavioral: Positive for depression and suicidal ideas. Negative for hallucinations and substance abuse. The patient is nervous/anxious and has insomnia.      Past Medical Family, Social History: Married with kids. Unemployed and on disability.   reports that she has never smoked. She has never used smokeless tobacco. She reports that she does not drink alcohol or use illicit drugs.  Family History  Problem Relation Age of Onset  . Lung cancer Father     smoker  . Diabetes      a lot of relatives  . Anxiety disorder Mother   . ADD / ADHD Mother   . Depression Mother   . Suicidality Neg Hx   . ADD / ADHD Daughter   . Bipolar disorder Daughter    Past Medical History  Diagnosis Date  . Kidney disease     renal angiomyolipomas  . Tuberous sclerosis   . Bipolar disorder   . Cervical disc disease   . Hypertension   . Liver cyst   . Iron deficiency   . Depression   . Anxiety   . ADHD (attention deficit hyperactivity disorder)   . Colon polyps  adenomatous  . Melanosis     Outpatient Encounter Prescriptions as of 10/30/2014  Medication Sig  . ALPRAZolam (XANAX) 1 MG tablet Take 1 tablet (1 mg total) by mouth 3 (three) times daily as needed for anxiety.  Marland Kitchen amLODipine (NORVASC) 10 MG tablet Take 10 mg by mouth daily.  Marland Kitchen amphetamine-dextroamphetamine (ADDERALL) 30 MG tablet Take 1 tablet by mouth 2 (two) times daily.  . hydrochlorothiazide (MICROZIDE) 12.5 MG capsule Take 12.5 mg by mouth daily.   Marland Kitchen labetalol (NORMODYNE) 200 MG tablet Take 200 mg by mouth 3 (three) times daily.  Marland Kitchen lamoTRIgine (LAMICTAL) 100 MG tablet Take 3 tablets (300 mg total) by mouth daily.  . Linaclotide (LINZESS) 290 MCG CAPS capsule Take 1 capsule (290 mcg total) by mouth daily.  Marland Kitchen losartan (COZAAR) 100 MG tablet Take 100 mg by mouth daily.  . norethindrone-ethinyl estradiol (JUNEL FE,GILDESS FE,LOESTRIN FE) 1-20 MG-MCG tablet Take 1 tablet by mouth daily.  . QUEtiapine (SEROQUEL) 200 MG tablet Take 1 tablet (200 mg total) by mouth at bedtime.  . sertraline (ZOLOFT) 100 MG tablet Take 1 tablet (100 mg total) by mouth daily.  Marland Kitchen everolimus (AFINITOR) 5 MG tablet Take 5 mg by mouth daily.    Past Psychiatric History/Hospitalization(s): Anxiety: Yes Bipolar Disorder: Yes Depression: Yes Mania: Yes Psychosis: No Schizophrenia: No Personality Disorder: No Hospitalization for psychiatric illness: No History of Electroconvulsive Shock Therapy: No Prior Suicide Attempts: No  Physical Exam: Constitutional:  BP 149/87 mmHg  Pulse 96  Ht 5\' 9"  (1.753 m)  Wt 208 lb 6.4 oz (94.53 kg)  BMI 30.76 kg/m2  General Appearance: alert, oriented, no acute distress  Musculoskeletal: Strength & Muscle Tone: within normal limits Gait & Station: normal Patient leans: N/A  Mental Status Examination/Evaluation: Objective: Attitude: Calm and cooperative  Appearance: Fairly Groomed, appears to be stated age  Eye Contact::  Good  Speech:  Clear and Coherent and Normal Rate  Volume:  Normal  Mood:  depressed  Affect:  Congruent  Thought Process:  Circumstantial  Orientation:  Full (Time, Place, and Person)  Thought Content:  Negative  Suicidal Thoughts:  Yes.  without intent/plan  Homicidal Thoughts:  No  Judgement:  Fair  Insight:  Fair  Concentration: good  Memory: Immediate-fair Recent-fair Remote-fair  Recall: fair  Language: fair  Gait and Station: normal  [employer] of Knowledge: average   Psychomotor Activity:  Normal  Akathisia:  No  Handed:  Right  AIMS (if indicated):  n/a   Assets:  Armed forces logistics/support/administrative officer Desire for Improvement Financial Resources/Insurance Housing Intimacy Leisure Time Social Support Engineer, drilling (Choose Three): Review of Psycho-Social Stressors (1), Review or order clinical lab tests (1), Established Problem, Worsening (2), Review of Medication Regimen & Side Effects (2) and Review of New Medication or Change in Dosage (2)  Assessment: AXIS I  Bipolar disorder- currently depressed- moderate and recurrent, GAD and ADD and possibly OCD   AXIS II  Deferred   AXIS III  Past Medical History    Diagnosis  Date    .  Kidney disease       renal angiomyolipomas    .  Tuberous sclerosis     .  Bipolar disorder     .  Cervical disc disease     .  Hypertension     .  Liver cyst     .  Iron deficiency     .  Depression     .  Anxiety     .  ADHD (attention deficit hyperactivity disorder)     .  Colon polyps       adenomatous    .  Melanosis    AXIS IV  health stressors   AXIS V  51-60 moderate symptoms      Treatment Plan/Recommendations:  Plan of Care: Medication management with supportive therapy. Risks/benefits and SE of the medication discussed. Pt verbalized understanding and verbal consent obtained for treatment. Affirm with the patient that the medications are taken as ordered. Patient expressed understanding of how their medications were to be used.   -pt has signed a Medical release to obtain records from her previous psychiatrist   Laboratory: reviewed CBC WNL, CMP WNL, Chol and Trig elevated  ekg-ordered  Psychotherapy: Therapy: brief supportive therapy provided. Discussed psychosocial stressors in detail.   Medications: Zoloft increase to 150mg  po qD for depression and anxiety  Seroquel 200mg  po qHS for mood lability  Lamtical 100mg  TID for mood lability  Xanax 1mg  po TID prn  anxiety  Adderall 30mg  po BID for ADHD   Routine PRN Medications: No   Consultations: none at this time   Safety Concerns: Pt denies SI and is at an acute low risk for suicide.Patient told to call clinic if any problems occur. Patient advised to go to ER if they should develop SI/HI, side effects, or if symptoms worsen. Has crisis numbers to call if needed. Pt verbalized understanding.   Other: F/up in 3 months or sooner if needed    Charlcie Cradle, MD 10/30/2014

## 2014-11-01 ENCOUNTER — Ambulatory Visit (HOSPITAL_COMMUNITY): Payer: Self-pay | Admitting: Psychiatry

## 2014-11-29 ENCOUNTER — Telehealth (HOSPITAL_COMMUNITY): Payer: Self-pay | Admitting: *Deleted

## 2014-11-29 ENCOUNTER — Other Ambulatory Visit (HOSPITAL_COMMUNITY): Payer: Self-pay

## 2014-11-29 DIAGNOSIS — F988 Other specified behavioral and emotional disorders with onset usually occurring in childhood and adolescence: Secondary | ICD-10-CM

## 2014-11-29 MED ORDER — AMPHETAMINE-DEXTROAMPHETAMINE 30 MG PO TABS: 30.0000 mg | ORAL_TABLET | Freq: Two times a day (BID) | ORAL | Status: DC

## 2014-11-29 NOTE — Telephone Encounter (Signed)
Patient left message on nurse's voice mail requesting refill on Adderall. Last office visit was 10-30-14. Patient was told to follow up in 3 months.  Patient request refill of Adderall. Has four pills left.

## 2014-11-29 NOTE — Telephone Encounter (Signed)
Yes it is ok

## 2014-11-29 NOTE — Telephone Encounter (Signed)
Dr. Doyne Keel authorized a one time refill of patient's requested Adderall and order was printed out and signed today.  Called patient who agreed to come in and pick up new prescription.

## 2014-12-03 ENCOUNTER — Telehealth (HOSPITAL_COMMUNITY): Payer: Self-pay | Admitting: *Deleted

## 2014-12-03 NOTE — Telephone Encounter (Signed)
Picked up RX.. [id number]

## 2014-12-18 ENCOUNTER — Other Ambulatory Visit (HOSPITAL_COMMUNITY): Payer: Self-pay | Admitting: Psychiatry

## 2015-01-01 ENCOUNTER — Telehealth (HOSPITAL_COMMUNITY): Payer: Self-pay | Admitting: *Deleted

## 2015-01-01 DIAGNOSIS — F988 Other specified behavioral and emotional disorders with onset usually occurring in childhood and adolescence: Secondary | ICD-10-CM

## 2015-01-01 MED ORDER — AMPHETAMINE-DEXTROAMPHETAMINE 30 MG PO TABS: 30.0000 mg | ORAL_TABLET | Freq: Two times a day (BID) | ORAL | Status: DC

## 2015-01-01 NOTE — Telephone Encounter (Signed)
RX did not print had to reprint

## 2015-01-01 NOTE — Telephone Encounter (Signed)
Patient notified that Adderral RX is ready for pick up. Patient notified Xanax will be ready for refill on 01-28-2015. Patient will call when closer to that time.Melissa Ochoa

## 2015-01-01 NOTE — Telephone Encounter (Signed)
Ok lets refill the stimulant. Xanax will be filled closer to its refill date.

## 2015-01-01 NOTE — Telephone Encounter (Signed)
Dr. Doyne Keel,   It looks like Adderall is due for refill.   Xanax RX was given 10-30-2014.  Xanax was written for three times daily as needed.  270 tablets given.  Patient should be good til May 2.

## 2015-01-02 ENCOUNTER — Telehealth (HOSPITAL_COMMUNITY): Payer: Self-pay

## 2015-01-02 NOTE — Telephone Encounter (Signed)
01/02/15 1:45pm Pt's husband came and rx script DL #[id number] [name].Marland KitchenMariana Ochoa

## 2015-01-14 ENCOUNTER — Telehealth (HOSPITAL_COMMUNITY): Payer: Self-pay

## 2015-01-14 DIAGNOSIS — F411 Generalized anxiety disorder: Secondary | ICD-10-CM

## 2015-01-14 NOTE — Telephone Encounter (Signed)
Medication refill request for pt's Xanax.  Requests new 90 day order for [facility] as can take up to 10 days for delivery.  Verified [facility] last mailed meds 10/31/14 and pt. reports still has approximately 2 weeks worth of meds but wants sent on time.  Can send order to [facility] to have patient pick up to get to them. Patient returns for next evaluation on 01/31/15.

## 2015-01-15 MED ORDER — ALPRAZOLAM 1 MG PO TABS: 1.0000 mg | ORAL_TABLET | Freq: Three times a day (TID) | ORAL | Status: DC | PRN

## 2015-01-15 NOTE — Telephone Encounter (Signed)
Yes we can send in the script to [facility]

## 2015-01-15 NOTE — Telephone Encounter (Signed)
Met with Dr. Doyne Keel to discuss patient's request to now have her Alprazolam medication called into [facility] for mail deliver of 90 day orders.  Dr. Doyne Keel authorized patient's new order and called in 36 day order with Clara, pharmacist at [facility].  Patient requested refill at this time due to 10 days to 14 day requirement for medications to be delivered.  [facility] and should receive new order prior to running out.  Patient to see Dr. Doyne Keel on 01/31/15.

## 2015-01-18 ENCOUNTER — Other Ambulatory Visit (HOSPITAL_COMMUNITY): Payer: Self-pay | Admitting: Psychiatry

## 2015-01-22 ENCOUNTER — Telehealth (HOSPITAL_COMMUNITY): Payer: Self-pay | Admitting: *Deleted

## 2015-01-22 NOTE — Telephone Encounter (Signed)
Received prior authorization for Alprazolam. Called for authorizaton was told to do it through Covermymeds. Submitted online. Awaiting response.

## 2015-01-23 ENCOUNTER — Telehealth (HOSPITAL_COMMUNITY): Payer: Self-pay | Admitting: *Deleted

## 2015-01-23 NOTE — Telephone Encounter (Signed)
[employer] called.  Patient's prior authorization for Xanax has been approved.

## 2015-01-28 ENCOUNTER — Telehealth (HOSPITAL_COMMUNITY): Payer: Self-pay

## 2015-01-28 DIAGNOSIS — F988 Other specified behavioral and emotional disorders with onset usually occurring in childhood and adolescence: Secondary | ICD-10-CM

## 2015-01-28 NOTE — Telephone Encounter (Signed)
Medication refill request for Adderall - Patient is scheduled for 01/31/15 but states she may have to cancel and reschedule as her husband is scheduled for a colonoscopy that date.   Requests refill be prepared for her Addderal as was last written 01/01/15 and does not want to run out.

## 2015-01-29 ENCOUNTER — Telehealth (HOSPITAL_COMMUNITY): Payer: Self-pay

## 2015-01-29 MED ORDER — AMPHETAMINE-DEXTROAMPHETAMINE 30 MG PO TABS: 30.0000 mg | ORAL_TABLET | Freq: Two times a day (BID) | ORAL | Status: DC

## 2015-01-29 NOTE — Telephone Encounter (Signed)
Melissa Ochoa, daughter picked up prescription on 01/29/15  DL  [place] [phone]  dlo

## 2015-01-29 NOTE — Telephone Encounter (Signed)
Telephone call with patient to inform Dr. Doyne Keel agreed to refill her Adderall one time if patient was going to cancel appointment for 01/31/15 but informed Dr. Doyne Keel would not authorize any further refills after that unless patient is evaluated.  Informed patient her Xanax order from [facility] was prepared 01/16/15 but could not be sent then due to problems with patient's payment.  Informed patient Melissa Ochoa at [facility] informed Melissa Ochoa, [facility] today that patient's Xanax order had now been mailed out and should arrive at patient's home by 01/30/15.  Requested patient call back if any further concerns and to reschedule appointment if could not keep for 01/31/15. New Adderall order printed, signed and left at the front desk for patient to pick up.

## 2015-01-29 NOTE — Telephone Encounter (Signed)
Yes we can refill this once. To get any further refills she will need to be evaluated.

## 2015-01-31 ENCOUNTER — Encounter (HOSPITAL_COMMUNITY): Payer: Self-pay | Admitting: Psychiatry

## 2015-01-31 ENCOUNTER — Ambulatory Visit (INDEPENDENT_AMBULATORY_CARE_PROVIDER_SITE_OTHER): Payer: No Typology Code available for payment source | Admitting: Psychiatry

## 2015-01-31 VITALS — BP 126/75 | HR 80 | Ht 69.0 in | Wt 219.6 lb

## 2015-01-31 DIAGNOSIS — F988 Other specified behavioral and emotional disorders with onset usually occurring in childhood and adolescence: Secondary | ICD-10-CM

## 2015-01-31 DIAGNOSIS — F909 Attention-deficit hyperactivity disorder, unspecified type: Secondary | ICD-10-CM | POA: Diagnosis not present

## 2015-01-31 DIAGNOSIS — F411 Generalized anxiety disorder: Secondary | ICD-10-CM | POA: Diagnosis not present

## 2015-01-31 DIAGNOSIS — F3130 Bipolar disorder, current episode depressed, mild or moderate severity, unspecified: Secondary | ICD-10-CM

## 2015-01-31 DIAGNOSIS — F312 Bipolar disorder, current episode manic severe with psychotic features: Secondary | ICD-10-CM

## 2015-01-31 DIAGNOSIS — F3132 Bipolar disorder, current episode depressed, moderate: Secondary | ICD-10-CM

## 2015-01-31 MED ORDER — AMPHETAMINE-DEXTROAMPHETAMINE 30 MG PO TABS: 30.0000 mg | ORAL_TABLET | Freq: Two times a day (BID) | ORAL | Status: DC

## 2015-01-31 MED ORDER — LAMOTRIGINE 100 MG PO TABS: 300.0000 mg | ORAL_TABLET | Freq: Every day | ORAL | Status: DC

## 2015-01-31 MED ORDER — SERTRALINE HCL 100 MG PO TABS: 150.0000 mg | ORAL_TABLET | Freq: Every day | ORAL | Status: DC

## 2015-01-31 MED ORDER — QUETIAPINE FUMARATE 200 MG PO TABS: 200.0000 mg | ORAL_TABLET | Freq: Every day | ORAL | Status: DC

## 2015-01-31 NOTE — Progress Notes (Signed)
[facility] Progress Note  Melissa Ochoa [id number] 49 y.o.  01/31/2015 4:18 PM  Chief Complaint: "I am so glad to see you today"  History of Present Illness: Pt stopped Afinitor and had a flare up of TS and was severely depressed for one month. Pt is still depressed but it is slowly getting better. During this time she was isolating, withdrawn, anhedonia, low motivation, worthlessness and hopelessness. States concentration is poor. Her health stressors contribute to her depression.   Today states anxiety is more concerning than depression.   Anxiety was been high and she has 4 mild anxiety attacks. She is having chest tightening and body aches. Pt is taking one half tab of Xanax every 4 hrs.   Irritability remains high and she is snapping at family. Pt has low frustration tolerance.  Denies manic and hypomanic symptoms including periods of decreased need for sleep, increased energy, mood lability, impulsivity, FOI, and excessive spending.  Pt was getting about 6-8 hrs with meds. She is waking up multiple times a night. Energy is low and she tires easily. Pt does not nap during the day. Seroquel is helping her to sleep. Appetite is fair but she has gained about 30 lbs in 6 months. Pt admits to eating carbs when stressed.   Pt takes meds as prescribed and denies SE.    Suicidal Ideation: No  wants to live for her kids Plan Formed: No Patient has means to carry out plan: No  Homicidal Ideation: No Plan Formed: No Patient has means to carry out plan: No  Review of Systems: Psychiatric: Agitation: Yes Hallucination: No Depressed Mood: Yes Insomnia: No Hypersomnia: Yes Altered Concentration: Yes Feels Worthless: Yes Grandiose Ideas: No Belief In Special Powers: No New/Increased Substance Abuse: No Compulsions: No  Neurologic: Headache: No Seizure: No Paresthesias: No  Review of Systems  Constitutional: Negative for fever, chills and weight loss.   HENT: Positive for ear pain. Negative for congestion, sore throat and tinnitus.   Respiratory: Negative for cough, sputum production and wheezing.   Cardiovascular: Negative for chest pain, palpitations and leg swelling.  Gastrointestinal: Positive for heartburn and nausea. Negative for vomiting and abdominal pain.  Musculoskeletal: Positive for back pain. Negative for joint pain and neck pain.  Skin: Negative for itching and rash.  Neurological: Positive for dizziness. Negative for tremors, sensory change, seizures, loss of consciousness and headaches.  Psychiatric/Behavioral: Positive for depression. Negative for suicidal ideas, hallucinations and substance abuse. The patient is nervous/anxious. The patient does not have insomnia.      Past Medical Family, Social History: Married with kids. Unemployed and on disability.   reports that she has never smoked. She has never used smokeless tobacco. She reports that she does not drink alcohol or use illicit drugs.  Family History  Problem Relation Age of Onset  . Lung cancer Father     smoker  . Diabetes      a lot of relatives  . Anxiety disorder Mother   . ADD / ADHD Mother   . Depression Mother   . Suicidality Neg Hx   . ADD / ADHD Daughter   . Bipolar disorder Daughter    Past Medical History  Diagnosis Date  . Kidney disease     renal angiomyolipomas  . Tuberous sclerosis   . Bipolar disorder   . Cervical disc disease   . Hypertension   . Liver cyst   . Iron deficiency   . Depression   . Anxiety   .  ADHD (attention deficit hyperactivity disorder)   . Colon polyps     adenomatous  . Melanosis     Outpatient Encounter Prescriptions as of 01/31/2015  Medication Sig  . ALPRAZolam (XANAX) 1 MG tablet Take 1 tablet (1 mg total) by mouth 3 (three) times daily as needed for anxiety.  Marland Kitchen amLODipine (NORVASC) 10 MG tablet Take 10 mg by mouth daily.  Marland Kitchen amphetamine-dextroamphetamine (ADDERALL) 30 MG tablet Take 1 tablet by mouth 2  (two) times daily.  . hydrochlorothiazide (MICROZIDE) 12.5 MG capsule Take 12.5 mg by mouth daily.  Marland Kitchen labetalol (NORMODYNE) 200 MG tablet Take 200 mg by mouth 3 (three) times daily.  Marland Kitchen lamoTRIgine (LAMICTAL) 100 MG tablet Take 3 tablets (300 mg total) by mouth daily.  . Linaclotide (LINZESS) 290 MCG CAPS capsule Take 1 capsule (290 mcg total) by mouth daily.  Marland Kitchen losartan (COZAAR) 100 MG tablet Take 100 mg by mouth daily.  . norethindrone-ethinyl estradiol (JUNEL FE,GILDESS FE,LOESTRIN FE) 1-20 MG-MCG tablet Take 1 tablet by mouth daily.  . QUEtiapine (SEROQUEL) 200 MG tablet Take 1 tablet (200 mg total) by mouth at bedtime.  Marland Kitchen QUEtiapine (SEROQUEL) 200 MG tablet TAKE 1 BY MOUTH AT BEDTIME  . sertraline (ZOLOFT) 100 MG tablet Take 1.5 tablets (150 mg total) by mouth daily.  Marland Kitchen everolimus (AFINITOR) 5 MG tablet Take 5 mg by mouth daily.   No facility-administered encounter medications on file as of 01/31/2015.    Past Psychiatric History/Hospitalization(s): Anxiety: Yes Bipolar Disorder: Yes Depression: Yes Mania: Yes Psychosis: No Schizophrenia: No Personality Disorder: No Hospitalization for psychiatric illness: No History of Electroconvulsive Shock Therapy: No Prior Suicide Attempts: No  Physical Exam: Constitutional:  BP 126/75 mmHg  Pulse 80  Ht 5\' 9"  (1.753 m)  Wt 219 lb 9.6 oz (99.61 kg)  BMI 32.41 kg/m2  General Appearance: alert, oriented, no acute distress  Musculoskeletal: Strength & Muscle Tone: within normal limits Gait & Station: normal Patient leans: N/A  Mental Status Examination/Evaluation: Objective: Attitude: Calm and cooperative  Appearance: Fairly Groomed, appears to be stated age  Eye Contact::  Good  Speech:  Clear and Coherent and Normal Rate  Volume:  Normal  Mood:  depressed  Affect:  Congruent  Thought Process:  Circumstantial  Orientation:  Full (Time, Place, and Person)  Thought Content:  Negative  Suicidal Thoughts:  No  Homicidal  Thoughts:  No  Judgement:  Fair  Insight:  Fair  Concentration: good  Memory: Immediate-fair Recent-fair Remote-fair  Recall: fair  Language: fair  Gait and Station: normal  [employer] of Knowledge: average  Psychomotor Activity:  Normal  Akathisia:  No  Handed:  Right  AIMS (if indicated):  n/a   Assets:  Armed forces logistics/support/administrative officer Desire for Improvement Financial Resources/Insurance Housing Intimacy Leisure Time Social Support Engineer, drilling (Choose Three): Review of Psycho-Social Stressors (1), Established Problem, Worsening (2) and Review of Medication Regimen & Side Effects (2)  Assessment: AXIS I  Bipolar disorder- currently depressed- moderate and recurrent, GAD and ADD and possibly OCD   AXIS II  Deferred   AXIS III  Past Medical History    Diagnosis  Date    .  Kidney disease       renal angiomyolipomas    .  Tuberous sclerosis     .  Bipolar disorder     .  Cervical disc disease     .  Hypertension     .  Liver  cyst     .  Iron deficiency     .  Depression     .  Anxiety     .  ADHD (attention deficit hyperactivity disorder)     .  Colon polyps       adenomatous    .  Melanosis    AXIS IV  health stressors   AXIS V  51-60 moderate symptoms      Treatment Plan/Recommendations:  Plan of Care: Medication management with supportive therapy. Risks/benefits and SE of the medication discussed. Pt verbalized understanding and verbal consent obtained for treatment. Affirm with the patient that the medications are taken as ordered. Patient expressed understanding of how their medications were to be used.   -pt has signed a Medical release to obtain records from her previous psychiatrist   Laboratory: reviewed CBC WNL, CMP WNL, Chol and Trig elevated  ekg-ordered  Psychotherapy: Therapy: brief supportive therapy provided. Discussed psychosocial stressors in detail.  -discussed health stressors and coping  mechanisms in detail  Medications: Zoloft  150mg  po qD for depression and anxiety  Seroquel 200mg  po qHS for mood lability  Lamtical 100mg  TID for mood lability  Xanax 1mg  po TID prn anxiety - 90 day supply last filled on 01/29/15 Adderall 30mg  po BID for ADHD   Routine PRN Medications: No   Consultations: none at this time   Safety Concerns: Pt denies SI and is at an acute low risk for suicide.Patient told to call clinic if any problems occur. Patient advised to go to ER if they should develop SI/HI, side effects, or if symptoms worsen. Has crisis numbers to call if needed. Pt verbalized understanding.   Other: F/up in 6 months or sooner if needed    Charlcie Cradle, MD 01/31/2015

## 2015-02-06 ENCOUNTER — Other Ambulatory Visit (HOSPITAL_COMMUNITY): Payer: Self-pay | Admitting: Internal Medicine

## 2015-02-06 DIAGNOSIS — R140 Abdominal distension (gaseous): Secondary | ICD-10-CM

## 2015-02-06 DIAGNOSIS — R197 Diarrhea, unspecified: Secondary | ICD-10-CM

## 2015-02-08 ENCOUNTER — Ambulatory Visit (HOSPITAL_COMMUNITY): Payer: Self-pay

## 2015-03-05 ENCOUNTER — Telehealth (HOSPITAL_COMMUNITY): Payer: Self-pay

## 2015-03-05 ENCOUNTER — Telehealth (HOSPITAL_COMMUNITY): Payer: Self-pay | Admitting: *Deleted

## 2015-03-05 NOTE — Telephone Encounter (Signed)
Prior authorization for Adderall complete. Called to verify insurance coverage because it would not pull up on cover my meds. Was told to submit without verification because the patient does have active coverage. Submitted online and awaiting decision.

## 2015-03-05 NOTE — Telephone Encounter (Signed)
Telephone call with patient to follow up on a phone message left by patient stating she had to pay out of pocket for her Adderal prescription this past month due to he prior authorization running out.  Patient requested this nurse contact [employer] to have approved as states cannot afford $100 per month.  Requests to see if they can back date authorization so [facility] will reimburse her some money.  Call to [facility] [place] at [phone] to initiate prior authorization as they requested form be completed through Cover My Meds.

## 2015-03-14 ENCOUNTER — Telehealth (HOSPITAL_COMMUNITY): Payer: Self-pay | Admitting: *Deleted

## 2015-03-14 ENCOUNTER — Telehealth (HOSPITAL_COMMUNITY): Payer: Self-pay

## 2015-03-14 NOTE — Telephone Encounter (Signed)
Telephone call with patient to follow up on her call from 03/13/15 late pm questioning if her Adderall prior authorization had gone through.  Informed patient it appears in CoverMyMeds the medication does not require a PA but is covered at a higher cost.  Agreed to have Claudius Sis, RN check into this and will let patient know if there is another reason or if something else is needed.

## 2015-03-14 NOTE — Telephone Encounter (Signed)
Called to check status of prior authorization of Adderall since it showed "cancelled" in cover my meds. Was told to try the [employer] medicare form instead of the regular bcbs form. Was able to pull up patient and re-submit. Decision will be faxed within 72 hours.

## 2015-03-14 NOTE — Telephone Encounter (Signed)
Telephone message received from Ko Vaya with [facility] stating patient's prior authorization request for Adderall ws denied as a non-formulary exception request. Telephone call to inform patient of denied Adderall and patient agreed to call the [phone] phone number to follow up.

## 2015-04-03 ENCOUNTER — Other Ambulatory Visit (HOSPITAL_COMMUNITY): Payer: Self-pay | Admitting: Psychiatry

## 2015-04-03 ENCOUNTER — Telehealth (HOSPITAL_COMMUNITY): Payer: Self-pay

## 2015-04-03 DIAGNOSIS — F988 Other specified behavioral and emotional disorders with onset usually occurring in childhood and adolescence: Secondary | ICD-10-CM

## 2015-04-03 NOTE — Telephone Encounter (Signed)
Met with Dr.Marceil Welp who discussed possibly changing patient over to Vyvanse 27m, one a day but since patient just got Adderall filled, to continue with it for now and call back in 4 weeks when getting ready to run out to discuss the change further.  Called patient back and gave her this message as patient agreed with plan.  Patient to call in 4 weeks to have medication changed as states has 28 days remaining of Adderall as was picked up on 04/01/15 per patient report.

## 2015-04-03 NOTE — Telephone Encounter (Signed)
Xanax refill declined at this time as was last filled per patient report on 01/29/15, see Dr. Havery Moros note from 01/31/15 so too early to refill at this time.

## 2015-04-03 NOTE — Telephone Encounter (Signed)
Telephone call with patient after she left a message requesting a call back.  Patient stated her Adderall was costing he $99 a month due to not on her formulary and would like to try Vyvanse.  States she thinks this is covered at a much lower cost with a years coupon and has reports she has taken it in the past and worked okay.  Patient would like not to have to come back in to discuss due to cost of appointment co-pays and states was just seen by Dr. Doyne Keel on 01/31/15 and not set to return until 07/30/15  Would like to know if has to come in or could change be considered?

## 2015-04-22 ENCOUNTER — Telehealth (HOSPITAL_COMMUNITY): Payer: Self-pay

## 2015-04-22 DIAGNOSIS — F411 Generalized anxiety disorder: Secondary | ICD-10-CM

## 2015-04-22 DIAGNOSIS — F988 Other specified behavioral and emotional disorders with onset usually occurring in childhood and adolescence: Secondary | ICD-10-CM

## 2015-04-22 NOTE — Telephone Encounter (Signed)
Will do one month for both meds

## 2015-04-22 NOTE — Telephone Encounter (Signed)
Medication management - patient requests [facility] [address] and not sent to [place] as she is leaving to go to [place] on 04/25/15 and medications will not arrive in time by mail order.  Patient also states she is in need of a new Adderal prescription to fill on or after 05/02/15.  Patient would like to have 90 day Xanax order called in by 04/24/15 and to pick up new refill prescription for Adderall by that date before leaving on 04/25/15.  Informed patient this would have to be requested from one of the covering physicians while Dr. Doyne Keel is still out on maternity leave and will call back once approved.  Patient to see Dr. Doyne Keel next on 08/02/15.

## 2015-04-24 ENCOUNTER — Telehealth (HOSPITAL_COMMUNITY): Payer: Self-pay

## 2015-04-24 MED ORDER — AMPHETAMINE-DEXTROAMPHETAMINE 30 MG PO TABS: 30.0000 mg | ORAL_TABLET | Freq: Two times a day (BID) | ORAL | Status: DC

## 2015-04-24 MED ORDER — ALPRAZOLAM 1 MG PO TABS: 1.0000 mg | ORAL_TABLET | Freq: Three times a day (TID) | ORAL | Status: DC | PRN

## 2015-04-24 NOTE — Telephone Encounter (Signed)
04/24/15 1:48pm Patient came and pick-up rx script  [id number].Marland KitchenMariana Ochoa

## 2015-04-24 NOTE — Addendum Note (Signed)
Addended by: Watt Climes on: 04/24/2015 12:22 PM   Modules accepted: Orders

## 2015-04-24 NOTE — Telephone Encounter (Signed)
Met with Dr. Salem Senate who approved a one time refill for 30 day supplies of patient's Xanax and Adderall prescriptions.  Dr. Salem Senate signed new Adderall 30 day order for patient to pick up and new 30 day Xanax order [facility] [address] per patient request with Alyse Low, pharmacist. Called patient back to inform Adderall order was prepared for pick up and [facility] time [facility] [address].  Patient questioned if she could be changed to Vyvanse due to cost of Adderall and discussed with Dr. Salem Senate patient will need to be seen for changes as she has not met with her in the past.  Patient agreed with plan to make an earlier appointment to return to discuss and will pick up Adderall prescription today as she is leaving to go out of town on 04/25/15.  Patient to call back if any further concerns.

## 2015-05-06 ENCOUNTER — Other Ambulatory Visit (HOSPITAL_COMMUNITY): Payer: Self-pay | Admitting: Psychiatry

## 2015-05-24 ENCOUNTER — Telehealth (HOSPITAL_COMMUNITY): Payer: Self-pay

## 2015-05-24 DIAGNOSIS — F411 Generalized anxiety disorder: Secondary | ICD-10-CM

## 2015-05-24 MED ORDER — ALPRAZOLAM 1 MG PO TABS: 1.0000 mg | ORAL_TABLET | Freq: Three times a day (TID) | ORAL | Status: DC | PRN

## 2015-05-24 NOTE — Telephone Encounter (Signed)
Medication management - telephone message received from patient requesting a 90 day refill of her Alprazolam which was last filled 01/29/15 per Dr. Doyne Keel note 01/31/15. Patient returns on 07/30/15. Dr. Dwyane Dee authorized a new 90 day order to be called in to [facility] on [address].  Called in new authorized refill of patient's Alprazolam 1mg  tablet, TID, #[id number] with no refills to [facility], pharmacist at [facility] on [address] and verified patient was not filling any orders early.  Called patient and informed her 3 month supply of requested Alprazolam was [facility] this [facility].  Patient was appreciative and stated she may call and reschedule an earlier appointment with Dr. Doyne Keel when she returns but states no concerns at this time.

## 2015-06-06 ENCOUNTER — Telehealth (HOSPITAL_COMMUNITY): Payer: Self-pay

## 2015-06-06 DIAGNOSIS — F988 Other specified behavioral and emotional disorders with onset usually occurring in childhood and adolescence: Secondary | ICD-10-CM

## 2015-06-06 NOTE — Telephone Encounter (Signed)
Medication management - Telephone call with patient to follow up on  message she left requesting a 7 day refill of Adderall.  States she returns to see Dr. Doyne Keel on 06/13/15 and wants to discuss changing then due to cost so only wants 7 days till then. Patient would like to pick up a new order for Adderall 30mg  BID on 06/07/15 if authorized by Dr. Salem Senate.  Patient agreed to call back on 06/07/15 to see if MD willing to fill medication.

## 2015-06-07 ENCOUNTER — Telehealth (HOSPITAL_COMMUNITY): Payer: Self-pay

## 2015-06-07 MED ORDER — AMPHETAMINE-DEXTROAMPHETAMINE 30 MG PO TABS: 30.0000 mg | ORAL_TABLET | Freq: Two times a day (BID) | ORAL | Status: DC

## 2015-06-07 NOTE — Telephone Encounter (Signed)
06/07/15 1:07PM Patient came and pick-up rx script [id number].Marland KitchenMariana Ochoa

## 2015-06-07 NOTE — Telephone Encounter (Signed)
Medication refill - Telephone call with pt to inform Dr. Salem Senate printed her out a 1 week supply order for Adderall as requested due to cost she will be paying out of pocket.  Pt. agreed to keep appointment with Dr. Doyne Keel on 06/13/15 to discuss possible change in medication and will pick up one week order today.

## 2015-06-07 NOTE — Telephone Encounter (Signed)
1 week supply of Adderall given as patient has to pay out of pocket. She'll see Dr. Doyne Keel in a week

## 2015-06-13 ENCOUNTER — Ambulatory Visit (INDEPENDENT_AMBULATORY_CARE_PROVIDER_SITE_OTHER): Payer: No Typology Code available for payment source | Admitting: Psychiatry

## 2015-06-13 ENCOUNTER — Encounter (HOSPITAL_COMMUNITY): Payer: Self-pay | Admitting: Psychiatry

## 2015-06-13 VITALS — BP 121/78 | HR 95 | Ht 69.0 in | Wt 212.0 lb

## 2015-06-13 DIAGNOSIS — F3132 Bipolar disorder, current episode depressed, moderate: Secondary | ICD-10-CM

## 2015-06-13 DIAGNOSIS — F988 Other specified behavioral and emotional disorders with onset usually occurring in childhood and adolescence: Secondary | ICD-10-CM

## 2015-06-13 DIAGNOSIS — F3130 Bipolar disorder, current episode depressed, mild or moderate severity, unspecified: Secondary | ICD-10-CM

## 2015-06-13 DIAGNOSIS — F411 Generalized anxiety disorder: Secondary | ICD-10-CM | POA: Diagnosis not present

## 2015-06-13 DIAGNOSIS — F900 Attention-deficit hyperactivity disorder, predominantly inattentive type: Secondary | ICD-10-CM

## 2015-06-13 MED ORDER — LAMOTRIGINE 100 MG PO TABS: 300.0000 mg | ORAL_TABLET | Freq: Every day | ORAL | Status: DC

## 2015-06-13 MED ORDER — LISDEXAMFETAMINE DIMESYLATE 50 MG PO CAPS: 50.0000 mg | ORAL_CAPSULE | ORAL | Status: DC

## 2015-06-13 MED ORDER — SERTRALINE HCL 100 MG PO TABS: 150.0000 mg | ORAL_TABLET | Freq: Every day | ORAL | Status: DC

## 2015-06-13 MED ORDER — QUETIAPINE FUMARATE 200 MG PO TABS
200.0000 mg | ORAL_TABLET | Freq: Every day | ORAL | Status: DC
Start: 2015-06-13 — End: 2015-07-30

## 2015-06-13 NOTE — Progress Notes (Signed)
Patient ID: Melissa Ochoa, female   DOB: [birth year], 49 y.o.   MRN: [id number]  [facility] Progress Note  Melissa Ochoa [id number] 49 y.o.  06/13/2015 10:47 AM  Chief Complaint: anxiety is very high  History of Present Illness: Pt has been seeing her Neurologist on a regular basis. Reports multiple body aches.  She is very concerned and stressed by her daughter's health.   Pt states anxiety is very high and she is unable to tolerate any stress. She is overwhelmed and is easily distracted. Pt is irritable. Pt is having some symptoms of stress induced panic attacks. Pt takes 1/2-1 tab TID and it helps.   Pt is still depressed. She reports ongoing worthlessness.  Reports ongoing isolation, feeling withdrawn, anhedonia, low motivation, worthlessness and hopelessness. Pt is taking 2 online courses and is overwhelmed. Pt has not left the house on 2 weeks. Her health stressors contribute to her depression.   Irritability remains high and she is snapping at family. Pt has low frustration tolerance and feels sorry for herself due to her illness.  Denies manic and hypomanic symptoms including periods of decreased need for sleep, increased energy, mood lability, impulsivity, FOI, and excessive spending.  Pt was getting about 7-8 hrs of sleep with meds. She is waking up multiple times a night. Energy is low and she tires easily. Pt does not nap during the day. Seroquel is helping her to sleep. Appetite is fair. Pt admits to eating carbs when stressed.   States concentration is poor when not taking Adderall. Adderall helps her be less distracted. She is unsure how long the effects last. She would like to change to Vyvanse due to cost. Denies SE from Adderall.   Pt takes meds as prescribed and denies SE.  Pt has a rash on her arms and saw a dermatologist who didn't have any concerns.   Suicidal Ideation: No  wants to live for her kids Plan Formed: No Patient has means to carry  out plan: No  Homicidal Ideation: No Plan Formed: No Patient has means to carry out plan: No  Review of Systems: Psychiatric: Agitation: Yes Hallucination: No Depressed Mood: Yes Insomnia: No Hypersomnia: Yes Altered Concentration: Yes Feels Worthless: Yes Grandiose Ideas: No Belief In Special Powers: No New/Increased Substance Abuse: No Compulsions: No  Neurologic: Headache: No Seizure: No Paresthesias: No  Review of Systems  Constitutional: Negative for fever, chills and weight loss.  HENT: Negative for congestion, ear pain, sore throat and tinnitus.   Respiratory: Negative for cough, sputum production and wheezing.   Cardiovascular: Negative for chest pain, palpitations and leg swelling.  Gastrointestinal: Positive for heartburn and nausea. Negative for vomiting and abdominal pain.  Musculoskeletal: Positive for back pain. Negative for joint pain and neck pain.  Skin: Positive for rash. Negative for itching.  Neurological: Positive for dizziness. Negative for tremors, sensory change, seizures, loss of consciousness and headaches.  Psychiatric/Behavioral: Positive for depression. Negative for suicidal ideas, hallucinations and substance abuse. The patient is nervous/anxious. The patient does not have insomnia.      Past Medical Family, Social History: Married with kids. Unemployed and on disability.   reports that she has never smoked. She has never used smokeless tobacco. She reports that she does not drink alcohol or use illicit drugs.  Family History  Problem Relation Age of Onset  . Lung cancer Father     smoker  . Diabetes      a lot of relatives  .  Anxiety disorder Mother   . ADD / ADHD Mother   . Depression Mother   . Suicidality Neg Hx   . ADD / ADHD Daughter   . Bipolar disorder Daughter    Past Medical History  Diagnosis Date  . Kidney disease     renal angiomyolipomas  . Tuberous sclerosis   . Bipolar disorder   . Cervical disc disease   .  Hypertension   . Liver cyst   . Iron deficiency   . Depression   . Anxiety   . ADHD (attention deficit hyperactivity disorder)   . Colon polyps     adenomatous  . Melanosis     Outpatient Encounter Prescriptions as of 06/13/2015  Medication Sig  . ALPRAZolam (XANAX) 1 MG tablet Take 1 tablet (1 mg total) by mouth 3 (three) times daily as needed for anxiety.  Marland Kitchen amLODipine (NORVASC) 10 MG tablet Take 10 mg by mouth daily.  Marland Kitchen amphetamine-dextroamphetamine (ADDERALL) 30 MG tablet Take 1 tablet by mouth 2 (two) times daily.  Marland Kitchen amphetamine-dextroamphetamine (ADDERALL) 30 MG tablet Take 1 tablet by mouth 2 (two) times daily.  . hydrochlorothiazide (MICROZIDE) 12.5 MG capsule Take 12.5 mg by mouth daily.  Marland Kitchen labetalol (NORMODYNE) 200 MG tablet Take 200 mg by mouth 3 (three) times daily.  Marland Kitchen lamoTRIgine (LAMICTAL) 100 MG tablet Take 3 tablets (300 mg total) by mouth daily.  . Linaclotide (LINZESS) 290 MCG CAPS capsule Take 1 capsule (290 mcg total) by mouth daily.  Marland Kitchen losartan (COZAAR) 100 MG tablet Take 100 mg by mouth daily.  . norethindrone-ethinyl estradiol (JUNEL FE,GILDESS FE,LOESTRIN FE) 1-20 MG-MCG tablet Take 1 tablet by mouth daily.  . QUEtiapine (SEROQUEL) 200 MG tablet TAKE 1 BY MOUTH AT BEDTIME  . QUEtiapine (SEROQUEL) 200 MG tablet Take 1 tablet (200 mg total) by mouth at bedtime.  . sertraline (ZOLOFT) 100 MG tablet Take 1.5 tablets (150 mg total) by mouth daily.  Marland Kitchen everolimus (AFINITOR) 5 MG tablet Take 5 mg by mouth daily.   No facility-administered encounter medications on file as of 06/13/2015.    Past Psychiatric History/Hospitalization(s): Anxiety: Yes Bipolar Disorder: Yes Depression: Yes Mania: Yes Psychosis: No Schizophrenia: No Personality Disorder: No Hospitalization for psychiatric illness: No History of Electroconvulsive Shock Therapy: No Prior Suicide Attempts: No  Physical Exam: Constitutional:  BP 121/78 mmHg  Pulse 95  Ht 5\' 9"  (1.753 m)  Wt 212 lb  (96.163 kg)  BMI 31.29 kg/m2  General Appearance: alert, oriented, no acute distress  Musculoskeletal: Strength & Muscle Tone: within normal limits Gait & Station: normal Patient leans: N/A  Mental Status Examination/Evaluation: Objective: Attitude: Calm and cooperative  Appearance: Fairly Groomed, appears to be stated age  Eye Contact::  Good  Speech:  Clear and Coherent and Normal Rate  Volume:  Normal  Mood:  depressed  Affect:  Congruent  Thought Process:  Circumstantial  Orientation:  Full (Time, Place, and Person)  Thought Content:  Negative  Suicidal Thoughts:  No  Homicidal Thoughts:  No  Judgement:  Fair  Insight:  Fair  Concentration: good  Memory: Immediate-fair Recent-fair Remote-fair  Recall: fair  Language: fair  Gait and Station: normal  [employer] of Knowledge: average  Psychomotor Activity:  Normal  Akathisia:  No  Handed:  Right  AIMS (if indicated):  n/a   Assets:  Communication Skills Desire for Improvement Financial Resources/Insurance Housing Intimacy Leisure Time Social Support Solicitor Decision Making (Choose Three): Review of  Psycho-Social Stressors (1), Established Problem, Worsening (2), Review of Medication Regimen & Side Effects (2) and Review of New Medication or Change in Dosage (2)  Assessment: AXIS I  Bipolar disorder- currently depressed- moderate and recurrent, GAD and ADD and possibly OCD   AXIS II  Deferred   AXIS III  Past Medical History    Diagnosis  Date    .  Kidney disease       renal angiomyolipomas    .  Tuberous sclerosis     .  Bipolar disorder     .  Cervical disc disease     .  Hypertension     .  Liver cyst     .  Iron deficiency     .  Depression     .  Anxiety     .  ADHD (attention deficit hyperactivity disorder)     .  Colon polyps       adenomatous    .  Melanosis    AXIS IV  health stressors   AXIS V  51-60 moderate symptoms      Treatment  Plan/Recommendations:  Plan of Care: Medication management with supportive therapy. Risks/benefits and SE of the medication discussed. Pt verbalized understanding and verbal consent obtained for treatment. Affirm with the patient that the medications are taken as ordered. Patient expressed understanding of how their medications were to be used.   -pt has signed a Medical release to obtain records from her previous psychiatrist   Laboratory: ekg-ordered  Psychotherapy: Therapy: brief supportive therapy provided. Discussed psychosocial stressors in detail.  -discussed health stressors and coping mechanisms in detail  Medications: pt doesn't want to change her meds today except for stimulant due to cost Zoloft  150mg  po qD for depression and anxiety  Seroquel 200mg  po qHS for mood lability  Lamtical 100mg  TID for mood lability  Xanax 1mg  po TID prn anxiety - 90 day supply last filled on 05/24/2015 D/c Adderall due to cost Start trail of Vyvanse 30mg  po BID for ADHD   Routine PRN Medications: No   Consultations: recommend pt f/up with her dermatologist   Safety Concerns: Pt denies SI and is at an acute low risk for suicide.Patient told to call clinic if any problems occur. Patient advised to go to ER if they should develop SI/HI, side effects, or if symptoms worsen. Has crisis numbers to call if needed. Pt verbalized understanding.   Other: F/up in 2 months or sooner if needed    Charlcie Cradle, MD 06/13/2015

## 2015-06-14 ENCOUNTER — Telehealth (HOSPITAL_COMMUNITY): Payer: Self-pay

## 2015-06-14 NOTE — Telephone Encounter (Signed)
Telephone message left for patient after discussing with Dr. Salem Senate to inform patient would have to speak with Dr. Doyne Keel on Tuesday 06/18/15 her request to changed back to Adderall that cost approximately $100 a month as reports cannot afford Vyvanse which is over $230 a month.  Informed patient to call this nurse back to discuss if needed or to follow up on Tuesday 06/18/15 with Dr. Doyne Keel.

## 2015-06-14 NOTE — Telephone Encounter (Signed)
Telephone call from patient stating she saw Dr. Doyne Keel on 06/13/15 and was taken off Adderall due to cost and started Vyvanse.  Patient stated Dr. Doyne Keel gave her a coupon for the Vyvanse trial but the coupon could not be used to fill the medication at her pharmacy.  Patient requests to just go back to Adderall and requests an order be prepared today as states she is out.  Informed Dr. Doyne Keel is only in this office every Tuesday and Thursday so would have to see if another provider would be willing to write this prescription and she would have to return Vyvanse prescription.

## 2015-06-14 NOTE — Telephone Encounter (Signed)
Patient can talk to Dr. Doyne Keel on Tuesday.

## 2015-07-09 ENCOUNTER — Telehealth (HOSPITAL_COMMUNITY): Payer: Self-pay

## 2015-07-09 NOTE — Telephone Encounter (Signed)
Medication management - Telephone call with patient to follow up on message she left requesting Dr. Doyne Keel change her back to Adderall 30mg  BID and write a letter to [facility] of [place] requesting they provide this at a lowere cost than $100 per month.   Patient reports she went ahead and paid for Vyvanse the previous month on 06/13/15 but cannot afford over $250 for the medication and her insurance company will not allow her to use a coupon.  Patient requested a new Adderall order and to go back to this stimulant with a letter from Dr. Doyne Keel to her insurance company to try to get monthly expense lowered.  Patient reported if Dr. Doyne Keel writes this is the best choice for her then they may approve at a lower rate.  Agreed to pass on request to Dr. Doyne Keel and to let her know no matter what patient would need a new prescription as will be out of medication for ADD on 07/12/15.

## 2015-07-11 NOTE — Telephone Encounter (Signed)
I will discuss it with her at her next appointment

## 2015-07-12 NOTE — Telephone Encounter (Signed)
Medication management - Telephone follow up with pt. to inform Dr. Adele Schilder agreed to write another order for Vyvanse but would not change back to Adderall as would have to be Dr. Doyne Keel to decide if this appropriate.  Patient stated she understood and did not want an order for Vyvanse as states she just cannot afford it.  Agreed to send note to Dr. Doyne Keel about her requested change and need for a new order back to Adderall but warned it may be 07/16/15 before this nurse receives a response due to that is the next time she is back in the office. Patient to call back that date if has not heard anything by then.

## 2015-07-12 NOTE — Telephone Encounter (Signed)
Medication management - Telehphone call with patient to inform Dr. Doyne Keel reported they would discuss this further at her evaluation set for 07/30/15 about writing a letter to her insurance to request Adderall approval at a lower rate and change. Patient stated she understood this be still needs a new order to change back to Adderall 30mg , BID as will be out of Vyvanse on 08/13/15 and cannot afford cost of $250 for Vyvanse.  Informed patient this nurse was not aware she needed a new order when she had her original discussion and Dr. Doyne Keel not here to change medication this date.  Agreed to request another provider change the order back but patient would have to wait until seen by Dr. Doyne Keel to discuss need for letter to have her insurance company approve Adderall at a lower tier cost.  Patient agreed with plan and informed would call back if another provider would write order for Adderall change back this date.

## 2015-07-16 ENCOUNTER — Telehealth (HOSPITAL_COMMUNITY): Payer: Self-pay

## 2015-07-16 MED ORDER — AMPHETAMINE-DEXTROAMPHETAMINE 30 MG PO TABS: 30.0000 mg | ORAL_TABLET | Freq: Two times a day (BID) | ORAL | Status: DC

## 2015-07-16 NOTE — Telephone Encounter (Signed)
Telephone call with patient to inform Dr. Doyne Keel had prepared her requested new order for Adderall to change her back to this medication and would be left at reception for pick up.  Patient agreed to pick up today and to call if any further concerns.

## 2015-07-16 NOTE — Telephone Encounter (Signed)
Kelise picked up her prescription on 02/98/47 Lic  [id number]  dlo

## 2015-07-30 ENCOUNTER — Ambulatory Visit (INDEPENDENT_AMBULATORY_CARE_PROVIDER_SITE_OTHER): Payer: No Typology Code available for payment source | Admitting: Psychiatry

## 2015-07-30 ENCOUNTER — Encounter (HOSPITAL_COMMUNITY): Payer: Self-pay | Admitting: Psychiatry

## 2015-07-30 VITALS — BP 128/84 | HR 83 | Ht 69.0 in | Wt 217.6 lb

## 2015-07-30 DIAGNOSIS — F411 Generalized anxiety disorder: Secondary | ICD-10-CM

## 2015-07-30 DIAGNOSIS — F988 Other specified behavioral and emotional disorders with onset usually occurring in childhood and adolescence: Secondary | ICD-10-CM

## 2015-07-30 DIAGNOSIS — F900 Attention-deficit hyperactivity disorder, predominantly inattentive type: Secondary | ICD-10-CM | POA: Diagnosis not present

## 2015-07-30 DIAGNOSIS — F3132 Bipolar disorder, current episode depressed, moderate: Secondary | ICD-10-CM

## 2015-07-30 DIAGNOSIS — F3130 Bipolar disorder, current episode depressed, mild or moderate severity, unspecified: Secondary | ICD-10-CM | POA: Diagnosis not present

## 2015-07-30 MED ORDER — LAMOTRIGINE 100 MG PO TABS: ORAL_TABLET | ORAL | Status: DC

## 2015-07-30 MED ORDER — AMPHETAMINE-DEXTROAMPHETAMINE 30 MG PO TABS: 30.0000 mg | ORAL_TABLET | Freq: Two times a day (BID) | ORAL | Status: DC

## 2015-07-30 MED ORDER — SERTRALINE HCL 100 MG PO TABS
150.0000 mg | ORAL_TABLET | Freq: Every day | ORAL | Status: DC
Start: 2015-07-30 — End: 2015-10-01

## 2015-07-30 MED ORDER — QUETIAPINE FUMARATE 200 MG PO TABS: 200.0000 mg | ORAL_TABLET | Freq: Every day | ORAL | Status: DC

## 2015-07-30 MED ORDER — ALPRAZOLAM 1 MG PO TABS: 1.0000 mg | ORAL_TABLET | Freq: Three times a day (TID) | ORAL | Status: DC | PRN

## 2015-07-30 NOTE — Progress Notes (Signed)
[facility] Progress Note  Melissa Ochoa [id number] 49 y.o.  07/30/2015 10:31 AM  Chief Complaint: I have been moody  History of Present Illness: Pt states she is irritable due to continued to pain. She is frustrated by her lack of ability to do things she used to do. New activity causes new pains.   States she was not abel to use Zyprexa because the coupon was not an option for her. States [employer] is asking for a letter explaining why pt is on Adderall and Xanax so monthly cost will decrease.   States concentration is poor when not taking Adderall. Adderall helps her be less distracted. States her comprehension is slow now.  Denies SE from Adderall. Memory is poor.   Pt states anxiety is very high and she is unable to tolerate any stress. She is overwhelmed and is easily distracted and restless.  Pt is having some symptoms of stress induced panic attacks. Pt takes 1/2-1 tabs TID of Xanax and it helps. Pt likes to go out with others and prefers not to go anywhere alone. She is slow physically.   Irritability remains high and she is snapping at family. States husband likes to tease her a lot and pt doesn't like to it.  Pt has low frustration tolerance and feels sorry for herself due to her illness  Pt is still depressed and it is recently worse due to loss of aunt and uncle. She reports ongoing worthlessness.  Reports ongoing isolation, feeling withdrawn, anhedonia, low motivation, worthlessness and hopelessness. Pt is taking 3 online courses and is overwhelmed.  Her health stressors contribute to her depression.   Denies manic and hypomanic symptoms including periods of decreased need for sleep, increased energy, mood lability, impulsivity, FOI, and excessive spending.  Pt was getting about 7-8 hrs of sleep with meds. Energy is low and she tires easily. Pt does not nap during the day. Seroquel is helping her to sleep.   Pt takes meds as prescribed and denies SE.      Suicidal Ideation: No  wants to live for her kids Plan Formed: No Patient has means to carry out plan: No  Homicidal Ideation: No Plan Formed: No Patient has means to carry out plan: No  Review of Systems: Psychiatric: Agitation: Yes Hallucination: No Depressed Mood: Yes Insomnia: No Hypersomnia: Yes Altered Concentration: Yes Feels Worthless: Yes Grandiose Ideas: No Belief In Special Powers: No New/Increased Substance Abuse: No Compulsions: No  Neurologic: Headache: No Seizure: No Paresthesias: No  Review of Systems  Constitutional: Negative for fever, chills and weight loss.  HENT: Negative for congestion, ear pain, sore throat and tinnitus.   Respiratory: Negative for cough, sputum production and wheezing.   Cardiovascular: Negative for chest pain, palpitations and leg swelling.  Gastrointestinal: Positive for heartburn and nausea. Negative for vomiting and abdominal pain.  Musculoskeletal: Positive for back pain. Negative for joint pain and neck pain.  Skin: Positive for rash. Negative for itching.  Neurological: Positive for dizziness. Negative for tremors, sensory change, seizures, loss of consciousness and headaches.  Psychiatric/Behavioral: Positive for depression and memory loss. Negative for suicidal ideas, hallucinations and substance abuse. The patient is nervous/anxious. The patient does not have insomnia.      Past Medical Family, Social History: Married with kids. Unemployed and on disability.   reports that she has never smoked. She has never used smokeless tobacco. She reports that she does not drink alcohol or use illicit drugs.  Family History  Problem  Relation Age of Onset  . Lung cancer Father     smoker  . Diabetes      a lot of relatives  . Anxiety disorder Mother   . ADD / ADHD Mother   . Depression Mother   . Suicidality Neg Hx   . ADD / ADHD Daughter   . Bipolar disorder Daughter    Past Medical History  Diagnosis Date  . Kidney  disease     renal angiomyolipomas  . Tuberous sclerosis ([place])   . Bipolar disorder ([place])   . Cervical disc disease   . Hypertension   . Liver cyst   . Iron deficiency   . Depression   . Anxiety   . ADHD (attention deficit hyperactivity disorder)   . Colon polyps     adenomatous  . Melanosis     Outpatient Encounter Prescriptions as of 07/30/2015  Medication Sig  . ALPRAZolam (XANAX) 1 MG tablet Take 1 tablet (1 mg total) by mouth 3 (three) times daily as needed for anxiety.  Marland Kitchen amLODipine (NORVASC) 10 MG tablet Take 10 mg by mouth daily.  Marland Kitchen amphetamine-dextroamphetamine (ADDERALL) 30 MG tablet Take 1 tablet by mouth 2 (two) times daily.  Marland Kitchen everolimus (AFINITOR) 5 MG tablet Take 5 mg by mouth daily.  . hydrochlorothiazide (MICROZIDE) 12.5 MG capsule Take 12.5 mg by mouth daily.  Marland Kitchen labetalol (NORMODYNE) 200 MG tablet Take 200 mg by mouth 3 (three) times daily.  Marland Kitchen lamoTRIgine (LAMICTAL) 100 MG tablet Take 3 tablets (300 mg total) by mouth daily.  . Linaclotide (LINZESS) 290 MCG CAPS capsule Take 1 capsule (290 mcg total) by mouth daily.  Marland Kitchen losartan (COZAAR) 100 MG tablet Take 100 mg by mouth daily.  . norethindrone-ethinyl estradiol (JUNEL FE,GILDESS FE,LOESTRIN FE) 1-20 MG-MCG tablet Take 1 tablet by mouth daily.  . QUEtiapine (SEROQUEL) 200 MG tablet Take 1 tablet (200 mg total) by mouth at bedtime.  . sertraline (ZOLOFT) 100 MG tablet Take 1.5 tablets (150 mg total) by mouth daily.   No facility-administered encounter medications on file as of 07/30/2015.    Past Psychiatric History/Hospitalization(s): Anxiety: Yes Bipolar Disorder: Yes Depression: Yes Mania: Yes Psychosis: No Schizophrenia: No Personality Disorder: No Hospitalization for psychiatric illness: No History of Electroconvulsive Shock Therapy: No Prior Suicide Attempts: No  Physical Exam: Constitutional:  BP 128/84 mmHg  Pulse 83  Ht 5\' 9"  (1.753 m)  Wt 217 lb 9.6 oz (98.703 kg)  BMI 32.12 kg/m2  General  Appearance: alert, oriented, no acute distress  Musculoskeletal: Strength & Muscle Tone: within normal limits Gait & Station: normal Patient leans: N/A  Mental Status Examination/Evaluation: Objective: Attitude: Calm and cooperative  Appearance: Fairly Groomed, appears to be stated age  Eye Contact::  Good  Speech:  Clear and Coherent and Normal Rate  Volume:  Normal  Mood:  depressed  Affect:  Congruent  Thought Process:  Circumstantial  Orientation:  Full (Time, Place, and Person)  Thought Content:  Negative  Suicidal Thoughts:  No  Homicidal Thoughts:  No  Judgement:  Fair  Insight:  Fair  Concentration: good  Memory: Immediate-fair Recent-fair to poor Remote-fair  Recall: fair to poor  Language: fair  Gait and Station: normal  [employer] of Knowledge: average  Psychomotor Activity:  Normal  Akathisia:  No  Handed:  Right  AIMS (if indicated):  n/a   Assets:  Communication Skills Desire for Improvement Financial Resources/Insurance Housing Intimacy Leisure Time Social Support Herbalist  Medical Decision Making (Choose Three): Review of Psycho-Social Stressors (1), Established Problem, Worsening (2), Review of Medication Regimen & Side Effects (2) and Review of New Medication or Change in Dosage (2)  Assessment: AXIS I  Bipolar disorder- currently depressed- moderate and recurrent, GAD and ADD and possibly OCD   AXIS II  Deferred   AXIS III  Past Medical History    Diagnosis  Date    .  Kidney disease       renal angiomyolipomas    .  Tuberous sclerosis     .  Bipolar disorder     .  Cervical disc disease     .  Hypertension     .  Liver cyst     .  Iron deficiency     .  Depression     .  Anxiety     .  ADHD (attention deficit hyperactivity disorder)     .  Colon polyps       adenomatous    .  Melanosis    AXIS IV  health stressors   AXIS V  51-60 moderate symptoms      Treatment Plan/Recommendations:  Plan of  Care: Medication management with supportive therapy. Risks/benefits and SE of the medication discussed. Pt verbalized understanding and verbal consent obtained for treatment. Affirm with the patient that the medications are taken as ordered. Patient expressed understanding of how their medications were to be used.   -pt has signed a Medical release to obtain records from her previous psychiatrist   Laboratory: ekg-ordered  Psychotherapy: Therapy: brief supportive therapy provided. Discussed psychosocial stressors in detail.  -discussed health stressors and coping mechanisms in detail  Medications:  Zoloft  150mg  po qD for depression and anxiety  Seroquel 200mg  po qHS for mood lability  Decrease Lamtical 100mg  BID for mood lability due to skin rash Xanax 1mg  po TID prn anxiety - 90 day supply last filled on 05/24/2015 Adderall 30mg  po BID for ADHD   Routine PRN Medications: No   Consultations: recommend pt f/up with her dermatologist   Safety Concerns: Pt denies SI and is at an acute low risk for suicide.Patient told to call clinic if any problems occur. Patient advised to go to ER if they should develop SI/HI, side effects, or if symptoms worsen. Has crisis numbers to call if needed. Pt verbalized understanding.   Other: F/up in 2 months or sooner if needed    Charlcie Cradle, MD 07/30/2015

## 2015-08-16 ENCOUNTER — Telehealth (HOSPITAL_COMMUNITY): Payer: Self-pay | Admitting: *Deleted

## 2015-08-16 NOTE — Telephone Encounter (Signed)
Prior authorization for Adderall received. Called [phone] was told to submit online with cover my meds. Submitted and waiting for decision to be made.

## 2015-09-09 ENCOUNTER — Telehealth (HOSPITAL_COMMUNITY): Payer: Self-pay

## 2015-09-09 NOTE — Telephone Encounter (Signed)
Telephone call with patient after she left a message she was in need of a new Adderall prescription.  Informed patient she was given 2 orders on 07/30/15 and patient then remembered she does have another prescription at home and will use it for medication for December.  Patient to call back if any further problems or needs.

## 2015-10-01 ENCOUNTER — Ambulatory Visit (INDEPENDENT_AMBULATORY_CARE_PROVIDER_SITE_OTHER): Payer: No Typology Code available for payment source | Admitting: Psychiatry

## 2015-10-01 ENCOUNTER — Encounter (HOSPITAL_COMMUNITY): Payer: Self-pay | Admitting: Psychiatry

## 2015-10-01 VITALS — BP 105/72 | HR 87 | Resp 12 | Ht 69.0 in | Wt 229.6 lb

## 2015-10-01 DIAGNOSIS — F3132 Bipolar disorder, current episode depressed, moderate: Secondary | ICD-10-CM | POA: Diagnosis not present

## 2015-10-01 DIAGNOSIS — F411 Generalized anxiety disorder: Secondary | ICD-10-CM

## 2015-10-01 DIAGNOSIS — F3130 Bipolar disorder, current episode depressed, mild or moderate severity, unspecified: Secondary | ICD-10-CM

## 2015-10-01 DIAGNOSIS — F900 Attention-deficit hyperactivity disorder, predominantly inattentive type: Secondary | ICD-10-CM

## 2015-10-01 DIAGNOSIS — F988 Other specified behavioral and emotional disorders with onset usually occurring in childhood and adolescence: Secondary | ICD-10-CM

## 2015-10-01 MED ORDER — SERTRALINE HCL 100 MG PO TABS: 150.0000 mg | ORAL_TABLET | Freq: Every day | ORAL | Status: DC

## 2015-10-01 MED ORDER — AMPHETAMINE-DEXTROAMPHETAMINE 30 MG PO TABS: 30.0000 mg | ORAL_TABLET | Freq: Two times a day (BID) | ORAL | Status: DC

## 2015-10-01 MED ORDER — QUETIAPINE FUMARATE 200 MG PO TABS: 200.0000 mg | ORAL_TABLET | Freq: Every day | ORAL | Status: DC

## 2015-10-01 MED ORDER — LAMOTRIGINE 100 MG PO TABS: 100.0000 mg | ORAL_TABLET | Freq: Two times a day (BID) | ORAL | Status: DC

## 2015-10-01 MED ORDER — ALPRAZOLAM 1 MG PO TABS
1.0000 mg | ORAL_TABLET | Freq: Three times a day (TID) | ORAL | Status: DC | PRN
Start: 2015-10-01 — End: 2015-12-03

## 2015-10-01 NOTE — Progress Notes (Signed)
Patient ID: Melissa Ochoa, female   DOB: [birth year], 50 y.o.   MRN: [id number]  [facility] Progress Note  Melissa Ochoa [id number] 49 y.o.  10/01/2015 11:25 AM  Chief Complaint: I am ok  History of Present Illness: Pt states she is irritable due to continued to pain. She is frustrated by her lack of ability to do things she used to do. New activity causes new pains.   Pt has been having a lot of leg pain and hasn't been able to do much. Reports she has gained weight and lost motivation. She feels it's another year and its never going to get better.   States concentration is poor when not taking Adderall. Adderall helps her be less distracted. States her comprehension is slow now.  Denies SE from Adderall. Memory is poor. Insurance is not covering it.  Pt states anxiety is very high and she is unable to tolerate any stress. She is overwhelmed and is easily distracted and restless.  Pt is having some symptoms of stress induced panic attacks but can usually talk herself out of it. Pt takes 1/2-1 tabs TID of Xanax and it helps. Pt likes to go out with others and prefers not to go anywhere alone. She is slow physically.   Irritability remains high and she is snapping at family. Pt unable to tolerate noise. Pt has low frustration tolerance and feels sorry for herself due to her illness  Pt is still depressed. She reports ongoing worthlessness.  Reports ongoing isolation, feeling withdrawn, anhedonia, low motivation, worthlessness and hopelessness.  Her health stressors contribute to her depression.   Denies manic and hypomanic symptoms including periods of decreased need for sleep, increased energy, mood lability, impulsivity, FOI, and excessive spending.  Pt was getting about 7-8 hrs of sleep with meds. Energy is low and she tires easily. Pt does not nap during the day. Seroquel is helping her to sleep.   Pt takes meds as prescribed and denies SE.     Suicidal  Ideation: No  wants to live for her kids Plan Formed: No Patient has means to carry out plan: No  Homicidal Ideation: No Plan Formed: No Patient has means to carry out plan: No  Review of Systems: Psychiatric: Agitation: Yes Hallucination: No Depressed Mood: Yes Insomnia: No Hypersomnia: Yes Altered Concentration: Yes Feels Worthless: Yes Grandiose Ideas: No Belief In Special Powers: No New/Increased Substance Abuse: No Compulsions: No  Neurologic: Headache: No Seizure: No Paresthesias: No  Review of Systems  Constitutional: Positive for malaise/fatigue. Negative for fever, chills and weight loss.  HENT: Negative for congestion, ear pain, sore throat and tinnitus.   Respiratory: Negative for cough, sputum production and wheezing.   Cardiovascular: Positive for leg swelling. Negative for chest pain and palpitations.  Gastrointestinal: Positive for heartburn and nausea. Negative for vomiting and abdominal pain.  Musculoskeletal: Positive for back pain and joint pain. Negative for neck pain.  Skin: Positive for rash. Negative for itching.  Neurological: Positive for dizziness. Negative for tremors, sensory change, seizures, loss of consciousness and headaches.  Psychiatric/Behavioral: Positive for depression and memory loss. Negative for suicidal ideas, hallucinations and substance abuse. The patient is nervous/anxious. The patient does not have insomnia.      Past Medical Family, Social History: Married with kids. Unemployed and on disability.   reports that she has never smoked. She has never used smokeless tobacco. She reports that she does not drink alcohol or use illicit drugs.  Family History  Problem Relation Age of Onset  . Lung cancer Father     smoker  . Diabetes      a lot of relatives  . Anxiety disorder Mother   . ADD / ADHD Mother   . Depression Mother   . Suicidality Neg Hx   . ADD / ADHD Daughter   . Bipolar disorder Daughter    Past Medical History   Diagnosis Date  . Kidney disease     renal angiomyolipomas  . 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 Bipolar disorder ([place])   . Cervical disc disease   . Hypertension   . Liver cyst   . Iron deficiency   . Depression   . Anxiety   . ADHD (attention deficit hyperactivity disorder)   . Colon polyps     adenomatous  . Melanosis     Outpatient Encounter Prescriptions as of 10/01/2015  Medication Sig  . ALPRAZolam (XANAX) 1 MG tablet Take 1 tablet (1 mg total) by mouth 3 (three) times daily as needed for anxiety.  Marland Kitchen amLODipine (NORVASC) 10 MG tablet Take 10 mg by mouth daily.  Marland Kitchen amphetamine-dextroamphetamine (ADDERALL) 30 MG tablet Take 1 tablet by mouth 2 (two) times daily.  Marland Kitchen amphetamine-dextroamphetamine (ADDERALL) 30 MG tablet Take 1 tablet by mouth 2 (two) times daily.  Marland Kitchen everolimus (AFINITOR) 5 MG tablet Take 5 mg by mouth daily.  . hydrochlorothiazide (MICROZIDE) 12.5 MG capsule Take 12.5 mg by mouth daily.  Marland Kitchen labetalol (NORMODYNE) 200 MG tablet Take 200 mg by mouth 3 (three) times daily.  Marland Kitchen lamoTRIgine (LAMICTAL) 100 MG tablet Take 50mg  po QM, 100mg  at Lunch and 100mg  at bedtime for 14 days then decrease to 100mg  BID  . Linaclotide (LINZESS) 290 MCG CAPS capsule Take 1 capsule (290 mcg total) by mouth daily.  Marland Kitchen losartan (COZAAR) 100 MG tablet Take 100 mg by mouth daily.  . norethindrone-ethinyl estradiol (JUNEL FE,GILDESS FE,LOESTRIN FE) 1-20 MG-MCG tablet Take 1 tablet by mouth daily.  . QUEtiapine (SEROQUEL) 200 MG tablet Take 1 tablet (200 mg total) by mouth at bedtime.  . sertraline (ZOLOFT) 100 MG tablet Take 1.5 tablets (150 mg total) by mouth daily.   No facility-administered encounter medications on file as of 10/01/2015.    Past Psychiatric History/Hospitalization(s): Anxiety: Yes Bipolar Disorder: Yes Depression: Yes Mania: Yes Psychosis: No Schizophrenia: No Personality Disorder: No Hospitalization for psychiatric illness: No History of Electroconvulsive Shock Therapy:  No Prior Suicide Attempts: No  Physical Exam: Constitutional:  BP 105/72 mmHg  Pulse 87  Resp 12  Ht 5\' 9"  (1.753 m)  Wt 229 lb 9.6 oz (104.146 kg)  BMI 33.89 kg/m2  General Appearance: alert, oriented, no acute distress  Musculoskeletal: Strength & Muscle Tone: within normal limits Gait & Station: normal Patient leans: N/A  Mental Status Examination/Evaluation: Objective: Attitude: Calm and cooperative  Appearance: Fairly Groomed, appears to be stated age  Eye Contact::  Good  Speech:  Clear and Coherent and Normal Rate  Volume:  Normal  Mood:  depressed  Affect:  Congruent and Depressed  Thought Process:  Circumstantial  Orientation:  Full (Time, Place, and Person)  Thought Content:  Negative  Suicidal Thoughts:  No  Homicidal Thoughts:  No  Judgement:  Fair  Insight:  Fair  Concentration: good  Memory: Immediate-fair Recent-fair to poor Remote-fair  Recall: fair to poor  Language: fair  Gait and Station: normal  [employer] of Knowledge: average  Psychomotor Activity:  Normal  Akathisia:  No  Handed:  Right  AIMS (if indicated):  n/a   Assets:  Communication Skills Desire for Improvement Financial Resources/Insurance Housing Intimacy Leisure Time Social Support Engineer, drilling (Choose Three): Review of Psycho-Social Stressors (1), Established Problem, Worsening (2), Review of Medication Regimen & Side Effects (2) and Review of New Medication or Change in Dosage (2)  Assessment: AXIS I  Bipolar disorder- currently depressed- moderate and recurrent, GAD and ADD and possibly OCD   AXIS II  Deferred      Treatment Plan/Recommendations:  Plan of Care: Medication management with supportive therapy. Risks/benefits and SE of the medication discussed. Pt verbalized understanding and verbal consent obtained for treatment. Affirm with the patient that the medications are taken as ordered. Patient expressed  understanding of how their medications were to be used.   -pt has signed a Medical release to obtain records from her previous psychiatrist   Laboratory: ekg-ordered, labs from PCP  Psychotherapy: Therapy: brief supportive therapy provided. Discussed psychosocial stressors in detail.  -discussed health stressors and coping mechanisms in detail  Medications:  Zoloft  150mg  po qD for depression and anxiety  Seroquel 200mg  po qHS for mood lability  Decrease Lamtical 100mg  BID for mood lability due to skin rash Xanax 1mg  po TID prn anxiety - 90 day supply last filled on 05/24/2015 Adderall 30mg  po BID for ADHD  Pt doesn't want meds changed today  Routine PRN Medications: No   Consultations: recommend pt f/up with her dermatologist   Safety Concerns: Pt denies SI and is at an acute low risk for suicide.Patient told to call clinic if any problems occur. Patient advised to go to ER if they should develop SI/HI, side effects, or if symptoms worsen. Has crisis numbers to call if needed. Pt verbalized understanding.   Other: F/up in 2 months or sooner if needed    Charlcie Cradle, MD 10/01/2015

## 2015-12-03 ENCOUNTER — Ambulatory Visit (INDEPENDENT_AMBULATORY_CARE_PROVIDER_SITE_OTHER): Payer: No Typology Code available for payment source | Admitting: Psychiatry

## 2015-12-03 ENCOUNTER — Encounter (HOSPITAL_COMMUNITY): Payer: Self-pay | Admitting: Psychiatry

## 2015-12-03 VITALS — BP 126/78 | HR 87 | Ht 69.0 in | Wt 227.0 lb

## 2015-12-03 DIAGNOSIS — F411 Generalized anxiety disorder: Secondary | ICD-10-CM | POA: Diagnosis not present

## 2015-12-03 DIAGNOSIS — F3130 Bipolar disorder, current episode depressed, mild or moderate severity, unspecified: Secondary | ICD-10-CM

## 2015-12-03 DIAGNOSIS — F900 Attention-deficit hyperactivity disorder, predominantly inattentive type: Secondary | ICD-10-CM | POA: Diagnosis not present

## 2015-12-03 MED ORDER — QUETIAPINE FUMARATE 200 MG PO TABS: 200.0000 mg | ORAL_TABLET | Freq: Every day | ORAL | Status: DC

## 2015-12-03 MED ORDER — METHYLPHENIDATE HCL 10 MG PO TABS: 10.0000 mg | ORAL_TABLET | Freq: Three times a day (TID) | ORAL | Status: DC

## 2015-12-03 MED ORDER — LAMOTRIGINE 100 MG PO TABS
100.0000 mg | ORAL_TABLET | Freq: Two times a day (BID) | ORAL | Status: DC
Start: 2015-12-03 — End: 2016-03-05

## 2015-12-03 MED ORDER — SERTRALINE HCL 100 MG PO TABS: 200.0000 mg | ORAL_TABLET | Freq: Every day | ORAL | Status: DC

## 2015-12-03 MED ORDER — ALPRAZOLAM 1 MG PO TABS: 1.0000 mg | ORAL_TABLET | Freq: Three times a day (TID) | ORAL | Status: DC | PRN

## 2015-12-03 MED ORDER — METHYLPHENIDATE HCL 10 MG PO TABS: 10.0000 mg | ORAL_TABLET | Freq: Every day | ORAL | Status: DC

## 2015-12-03 NOTE — Progress Notes (Signed)
[facility] [phone] Progress Note  Melissa Ochoa [id number] 50 y.o.  12/03/2015 11:07 AM  Chief Complaint: this is not a good year  History of Present Illness: Pt states she is irritable due to continued to pain. She is frustrated by her lack of ability to do things she used to do. New activity causes new pains.   Pt reports she has not been able to afford going to the doctor's much this year.   Her online course in history is making her depressed.   Pt reports continued financial and health stressors.    States concentration is poor when not taking Adderall. Adderall helps her be less distracted. States her comprehension is slow now.  Denies SE from Adderall. Memory is poor. Insurance is not covering it.  Pt states anxiety is worse and she is unable to tolerate any stress. She is overwhelmed and is easily distracted and restless.  Pt is having some symptoms of stress induced panic attacks but can usually talk herself out of it. Pt takes 1/2-1 tabs TID of Xanax and it helps. Pt likes to go out with others and prefers not to go anywhere alone. She is slow physically.   Irritability remains high and she is snapping at family. Pt unable to tolerate noise. Pt has low frustration tolerance and feels sorry for herself due to her illness  Pt is still depressed. Reports ongoing isolation, feeling withdrawn, anhedonia, low motivation, worthlessness and hopelessness.  Her health stressors contribute to her depression.   Denies manic and hypomanic symptoms including periods of decreased need for sleep, increased energy, mood lability, impulsivity, FOI, and excessive spending.  Pt was getting about 7-8 hrs of sleep with meds. Energy is low and she tires easily. Pt does not nap during the day. Seroquel is helping her to sleep.   Pt takes meds as prescribed and denies SE.     Suicidal Ideation: No  wants to live for her kids. Reports when in a lot of pain she has passive thoughts of  death. Plan Formed: No Patient has means to carry out plan: No  Homicidal Ideation: No Plan Formed: No Patient has means to carry out plan: No  Review of Systems: Psychiatric: Agitation: Yes Hallucination: No Depressed Mood: Yes Insomnia: No Hypersomnia: Yes Altered Concentration: Yes Feels Worthless: Yes Grandiose Ideas: No Belief In Special Powers: No New/Increased Substance Abuse: No Compulsions: No    Review of Systems  Constitutional: Positive for malaise/fatigue. Negative for fever, chills and weight loss.  HENT: Negative for congestion, ear pain, sore throat and tinnitus.   Eyes: Negative for blurred vision, double vision and pain.  Respiratory: Negative for cough, sputum production and wheezing.   Cardiovascular: Positive for leg swelling. Negative for chest pain and palpitations.  Gastrointestinal: Positive for heartburn and nausea. Negative for vomiting and abdominal pain.  Musculoskeletal: Positive for myalgias, back pain and joint pain. Negative for neck pain.  Skin: Positive for rash. Negative for itching.  Neurological: Positive for dizziness and headaches. Negative for tremors, sensory change, seizures and loss of consciousness.  Psychiatric/Behavioral: Positive for depression and memory loss. Negative for suicidal ideas, hallucinations and substance abuse. The patient is nervous/anxious. The patient does not have insomnia.      Past Medical Family, Social History: Married with kids. Unemployed and on disability.   reports that she has never smoked. She has never used smokeless tobacco. She reports that she does not drink alcohol or use illicit drugs.  Family History  Problem Relation Age of Onset  . Lung cancer Father     smoker  . Diabetes      a lot of relatives  . Anxiety disorder Mother   . ADD / ADHD Mother   . Depression Mother   . Suicidality Neg Hx   . ADD / ADHD Daughter   . Bipolar disorder Daughter    Past Medical History  Diagnosis Date   . Kidney disease     renal angiomyolipomas  . 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 Bipolar disorder ([place])   . Cervical disc disease   . Hypertension   . Liver cyst   . Iron deficiency   . Depression   . Anxiety   . ADHD (attention deficit hyperactivity disorder)   . Colon polyps     adenomatous  . Melanosis     Outpatient Encounter Prescriptions as of 12/03/2015  Medication Sig  . ALPRAZolam (XANAX) 1 MG tablet Take 1 tablet (1 mg total) by mouth 3 (three) times daily as needed for anxiety.  Marland Kitchen amLODipine (NORVASC) 10 MG tablet Take 10 mg by mouth daily.  Marland Kitchen amphetamine-dextroamphetamine (ADDERALL) 30 MG tablet Take 1 tablet by mouth 2 (two) times daily.  Marland Kitchen amphetamine-dextroamphetamine (ADDERALL) 30 MG tablet Take 1 tablet by mouth 2 (two) times daily.  Marland Kitchen everolimus (AFINITOR) 5 MG tablet Take 5 mg by mouth daily.  . hydrochlorothiazide (MICROZIDE) 12.5 MG capsule Take 12.5 mg by mouth daily.  Marland Kitchen labetalol (NORMODYNE) 200 MG tablet Take 200 mg by mouth 3 (three) times daily.  Marland Kitchen lamoTRIgine (LAMICTAL) 100 MG tablet Take 1 tablet (100 mg total) by mouth 2 (two) times daily.  . Linaclotide (LINZESS) 290 MCG CAPS capsule Take 1 capsule (290 mcg total) by mouth daily.  Marland Kitchen losartan (COZAAR) 100 MG tablet Take 100 mg by mouth daily.  . norethindrone-ethinyl estradiol (JUNEL FE,GILDESS FE,LOESTRIN FE) 1-20 MG-MCG tablet Take 1 tablet by mouth daily.  . QUEtiapine (SEROQUEL) 200 MG tablet Take 1 tablet (200 mg total) by mouth at bedtime.  . sertraline (ZOLOFT) 100 MG tablet Take 1.5 tablets (150 mg total) by mouth daily.   No facility-administered encounter medications on file as of 12/03/2015.    Past Psychiatric History/Hospitalization(s): Anxiety: Yes Bipolar Disorder: Yes Depression: Yes Mania: Yes Psychosis: No Schizophrenia: No Personality Disorder: No Hospitalization for psychiatric illness: No History of Electroconvulsive Shock Therapy: No Prior Suicide Attempts: No  Physical  Exam: Constitutional:  BP 126/78 mmHg  Pulse 87  Ht 5\' 9"  (1.753 m)  Wt 227 lb (102.967 kg)  BMI 33.51 kg/m2  General Appearance: alert, oriented, no acute distress  Musculoskeletal: Strength & Muscle Tone: within normal limits Gait & Station: normal Patient leans: N/A  Mental Status Examination/Evaluation: Objective: Attitude: Calm and cooperative  Appearance: Fairly Groomed, appears to be stated age  Eye Contact::  Good  Speech:  Clear and Coherent and Normal Rate  Volume:  Normal  Mood:  Depressed and anxious  Affect:  Congruent and Depressed  Thought Process:  Circumstantial  Orientation:  Full (Time, Place, and Person)  Thought Content:  Negative  Suicidal Thoughts:  No  Homicidal Thoughts:  No  Judgement:  Fair  Insight:  Fair  Concentration: poor  Memory: Immediate-fair Recent-fair to poor Remote-fair  Recall: fair to poor  Language: fair  Gait and Station: normal  [employer] of Knowledge: average  Psychomotor Activity:  Normal  Akathisia:  No  Handed:  Right  AIMS (if indicated):  n/a   Assets:  Communication Skills Desire for Improvement Financial Resources/Insurance Housing Intimacy Leisure Time Social Support Engineer, drilling (Choose Three): Review of Psycho-Social Stressors (1), Established Problem, Worsening (2), Review of Medication Regimen & Side Effects (2) and Review of New Medication or Change in Dosage (2)  Assessment: AXIS I  Bipolar disorder- currently depressed- moderate and recurrent, GAD and ADD and possibly OCD   AXIS II  Deferred      Treatment Plan/Recommendations:  Plan of Care: Medication management with supportive therapy. Risks/benefits and SE of the medication discussed. Pt verbalized understanding and verbal consent obtained for treatment. Affirm with the patient that the medications are taken as ordered. Patient expressed understanding of how their medications were to be  used.      Laboratory:  labs from PCP  Psychotherapy: Therapy: brief supportive therapy provided. Discussed psychosocial stressors in detail.  -discussed health stressors and coping mechanisms in detail  Medications:  Increase Zoloft to 200mg  po qD for depression and anxiety  Seroquel 200mg  po qHS for mood lability  Lamtical 100mg  BID for mood lability due to skin rash Xanax 1mg  po TID prn anxiety  D/c Adderall due to insurance Start trial of Ritalin 10mg  po TID for ADHD    Routine PRN Medications: No   Consultations: recommend pt f/up with her dermatologist and PCP  Safety Concerns: Pt denies SI and is at an acute low risk for suicide.Patient told to call clinic if any problems occur. Patient advised to go to ER if they should develop SI/HI, side effects, or if symptoms worsen. Has crisis numbers to call if needed. Pt verbalized understanding.   Other: F/up in 3 months or sooner if needed    Charlcie Cradle, MD 12/03/2015

## 2015-12-03 NOTE — Patient Instructions (Signed)
Labs:  CBC, CMP, HbA1c, Lipid panel, TSH, Prolactin level, EKG

## 2015-12-24 DIAGNOSIS — N182 Chronic kidney disease, stage 2 (mild): Secondary | ICD-10-CM | POA: Diagnosis not present

## 2015-12-24 DIAGNOSIS — E611 Iron deficiency: Secondary | ICD-10-CM | POA: Diagnosis not present

## 2016-01-01 ENCOUNTER — Telehealth (HOSPITAL_COMMUNITY): Payer: Self-pay

## 2016-01-01 DIAGNOSIS — Z905 Acquired absence of kidney: Secondary | ICD-10-CM | POA: Diagnosis not present

## 2016-01-01 DIAGNOSIS — E669 Obesity, unspecified: Secondary | ICD-10-CM | POA: Diagnosis not present

## 2016-01-01 DIAGNOSIS — Q851 Tuberous sclerosis: Secondary | ICD-10-CM | POA: Diagnosis not present

## 2016-01-01 DIAGNOSIS — N182 Chronic kidney disease, stage 2 (mild): Secondary | ICD-10-CM | POA: Diagnosis not present

## 2016-01-01 DIAGNOSIS — F900 Attention-deficit hyperactivity disorder, predominantly inattentive type: Secondary | ICD-10-CM

## 2016-01-01 DIAGNOSIS — D179 Benign lipomatous neoplasm, unspecified: Secondary | ICD-10-CM | POA: Diagnosis not present

## 2016-01-01 DIAGNOSIS — I10 Essential (primary) hypertension: Secondary | ICD-10-CM | POA: Diagnosis not present

## 2016-01-01 NOTE — Telephone Encounter (Signed)
Patient is calling about her Adderall, she states that it is not strong enough and she would like to increase the dose to 20 mg TID - It looks like you printed out 3 prescriptions at her last visit on 3/7, so if you decide to change therapy I will make sure patient brings those in. Please review and advise, thank you

## 2016-01-02 MED ORDER — AMPHETAMINE-DEXTROAMPHETAMINE 20 MG PO TABS: 20.0000 mg | ORAL_TABLET | Freq: Three times a day (TID) | ORAL | Status: DC

## 2016-01-02 NOTE — Telephone Encounter (Signed)
Yes we can d/c Ritalin. She can take Adderall 20mg  one tab po TID for her ADHD. Pt must bring in old Ritalin scripts before being given Adderall.

## 2016-01-02 NOTE — Telephone Encounter (Signed)
Printed the prescription for Adderall 20 mg tid. I called the patient and she will bring in her Ritalin prescriptions she still has.

## 2016-01-30 ENCOUNTER — Telehealth (HOSPITAL_COMMUNITY): Payer: Self-pay

## 2016-01-30 DIAGNOSIS — F900 Attention-deficit hyperactivity disorder, predominantly inattentive type: Secondary | ICD-10-CM

## 2016-01-30 MED ORDER — AMPHETAMINE-DEXTROAMPHETAMINE 20 MG PO TABS: 20.0000 mg | ORAL_TABLET | Freq: Three times a day (TID) | ORAL | Status: DC

## 2016-01-30 NOTE — Telephone Encounter (Signed)
Patient is calling for a refill on her Adderall 20 mg TID, she was last here on 3/7 and has a follow up on 6/8. Patient got a 30 day prescription on 4/6. Please review and advise, thank you

## 2016-01-30 NOTE — Telephone Encounter (Signed)
Printed the rx per Dr. Doyne Keel, called the patient to let her know that it would be ready.

## 2016-01-31 ENCOUNTER — Telehealth (HOSPITAL_COMMUNITY): Payer: Self-pay

## 2016-01-31 NOTE — Telephone Encounter (Signed)
01/31/16  Patient came and pick-up rx script .Marland KitchenMariana Ochoa

## 2016-02-14 ENCOUNTER — Other Ambulatory Visit (HOSPITAL_COMMUNITY): Payer: Self-pay | Admitting: Psychiatry

## 2016-03-05 ENCOUNTER — Ambulatory Visit (INDEPENDENT_AMBULATORY_CARE_PROVIDER_SITE_OTHER): Payer: No Typology Code available for payment source | Admitting: Psychiatry

## 2016-03-05 ENCOUNTER — Encounter (HOSPITAL_COMMUNITY): Payer: Self-pay | Admitting: Psychiatry

## 2016-03-05 VITALS — BP 138/82 | HR 87 | Ht 69.0 in | Wt 218.2 lb

## 2016-03-05 DIAGNOSIS — F411 Generalized anxiety disorder: Secondary | ICD-10-CM

## 2016-03-05 DIAGNOSIS — F3130 Bipolar disorder, current episode depressed, mild or moderate severity, unspecified: Secondary | ICD-10-CM

## 2016-03-05 DIAGNOSIS — F900 Attention-deficit hyperactivity disorder, predominantly inattentive type: Secondary | ICD-10-CM | POA: Diagnosis not present

## 2016-03-05 MED ORDER — AMPHETAMINE-DEXTROAMPHETAMINE 30 MG PO TABS: 30.0000 mg | ORAL_TABLET | Freq: Two times a day (BID) | ORAL | Status: DC

## 2016-03-05 MED ORDER — ALPRAZOLAM 1 MG PO TABS: 1.0000 mg | ORAL_TABLET | Freq: Three times a day (TID) | ORAL | Status: DC | PRN

## 2016-03-05 MED ORDER — SERTRALINE HCL 100 MG PO TABS: 200.0000 mg | ORAL_TABLET | Freq: Every day | ORAL | Status: DC

## 2016-03-05 MED ORDER — LAMOTRIGINE 100 MG PO TABS
100.0000 mg | ORAL_TABLET | Freq: Two times a day (BID) | ORAL | Status: DC
Start: 2016-03-05 — End: 2016-05-29

## 2016-03-05 MED ORDER — AMPHETAMINE-DEXTROAMPHETAMINE 30 MG PO TABS: 30.0000 mg | ORAL_TABLET | Freq: Three times a day (TID) | ORAL | Status: DC

## 2016-03-05 MED ORDER — QUETIAPINE FUMARATE 200 MG PO TABS: 200.0000 mg | ORAL_TABLET | Freq: Every day | ORAL | Status: DC

## 2016-03-05 NOTE — Progress Notes (Signed)
Patient ID: Melissa Ochoa, female   DOB: 1966-06-13, 50 y.o.   MRN: [id number]  [facility] Progress Note  Melissa Ochoa [id number] 50 y.o.  03/05/2016 10:52 AM  Chief Complaint: this last week I can not sleep  History of Present Illness: Pt states she is irritable due to continued to pain. She is frustrated by her lack of ability to do things she used to do. New activity causes new pains.   No longer taking online courses.   Pt reports continued financial and health stressors.    States concentration is poor when not taking Adderall. Adderall helps her be less distracted. States her comprehension is slow now.  Denies SE from Adderall. Memory is poor. Insurance is not covering it.  Pt states anxiety remains high  and she is unable to tolerate any stress. She is overwhelmed and is easily distracted and restless. Pt has a lot of anticipatory anxiety.  Pt is having some symptoms of stress induced panic attacks but can usually talk herself out of it. Pt takes 1/2-1 tabs TID of Xanax and it helps. Pt likes to go out with others and prefers not to go anywhere alone. She is slow physically.   Irritability remains high and she is snapping at family. Pt is going to be a grandmother and is very happy. Pt feels a sense of purpose. Pt unable to tolerate noise. Pt has low frustration tolerance and feels sorry for herself due to her illness  Pt is still depressed but it is a little better since finding out her daughter is pregnant.  Reports ongoing isolation, feeling withdrawn, anhedonia, low motivation, worthlessness and hopelessness.  Her health stressors contribute to her depression.   Denies manic and hypomanic symptoms including periods of decreased need for sleep, increased energy, mood lability, impulsivity, FOI, and excessive spending.  Pt was getting about 7-8 hrs of sleep with meds but this week she is has been only getting a few hours. . Energy is low and she tires  easily. Pt does not nap during the day. Seroquel is helping her to sleep.   Pt takes meds as prescribed and denies SE.     Suicidal Ideation: No  wants to live for her kids. Reports when in a lot of pain she has passive thoughts of death. Plan Formed: No Patient has means to carry out plan: No  Homicidal Ideation: No Plan Formed: No Patient has means to carry out plan: No  Review of Systems: Psychiatric: Agitation: Yes Hallucination: No Depressed Mood: Yes Insomnia: Yes Hypersomnia: No Altered Concentration: Yes Feels Worthless: Yes Grandiose Ideas: No Belief In Special Powers: No New/Increased Substance Abuse: No Compulsions: No    Review of Systems  Constitutional: Positive for malaise/fatigue. Negative for fever, chills and weight loss.  HENT: Negative for congestion, ear pain, sore throat and tinnitus.   Eyes: Negative for blurred vision, double vision and pain.  Respiratory: Negative for cough, sputum production and wheezing.   Cardiovascular: Positive for leg swelling. Negative for chest pain and palpitations.  Gastrointestinal: Positive for heartburn and nausea. Negative for vomiting and abdominal pain.  Musculoskeletal: Positive for myalgias, back pain and joint pain. Negative for neck pain.  Skin: Positive for rash. Negative for itching.  Neurological: Positive for dizziness and headaches. Negative for tremors, sensory change, seizures and loss of consciousness.  Psychiatric/Behavioral: Positive for depression and memory loss. Negative for suicidal ideas, hallucinations and substance abuse. The patient is nervous/anxious. The patient does not have  insomnia.      Past Medical Family, Social History: Married with kids. Unemployed and on disability.   reports that she has never smoked. She has never used smokeless tobacco. She reports that she does not drink alcohol or use illicit drugs.  Family History  Problem Relation Age of Onset  . Lung cancer Father     smoker   . Diabetes      a lot of relatives  . Anxiety disorder Mother   . ADD / ADHD Mother   . Depression Mother   . Suicidality Neg Hx   . ADD / ADHD Daughter   . Bipolar disorder Daughter    Past Medical History  Diagnosis Date  . Kidney disease     renal angiomyolipomas  . 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 Bipolar disorder ([place])   . Cervical disc disease   . Hypertension   . Liver cyst   . Iron deficiency   . Depression   . Anxiety   . ADHD (attention deficit hyperactivity disorder)   . Colon polyps     adenomatous  . Melanosis     Outpatient Encounter Prescriptions as of 03/05/2016  Medication Sig  . ALPRAZolam (XANAX) 1 MG tablet Take 1 tablet (1 mg total) by mouth 3 (three) times daily as needed for anxiety.  Marland Kitchen amLODipine (NORVASC) 10 MG tablet Take 10 mg by mouth daily.  Marland Kitchen amphetamine-dextroamphetamine (ADDERALL) 20 MG tablet Take 1 tablet (20 mg total) by mouth 3 (three) times daily.  Marland Kitchen everolimus (AFINITOR) 5 MG tablet Take 5 mg by mouth daily.  . hydrochlorothiazide (MICROZIDE) 12.5 MG capsule Take 12.5 mg by mouth daily.  Marland Kitchen labetalol (NORMODYNE) 200 MG tablet Take 200 mg by mouth 3 (three) times daily.  Marland Kitchen lamoTRIgine (LAMICTAL) 100 MG tablet Take 1 tablet (100 mg total) by mouth 2 (two) times daily.  . Linaclotide (LINZESS) 290 MCG CAPS capsule Take 1 capsule (290 mcg total) by mouth daily.  Marland Kitchen losartan (COZAAR) 100 MG tablet Take 100 mg by mouth daily.  . norethindrone-ethinyl estradiol (JUNEL FE,GILDESS FE,LOESTRIN FE) 1-20 MG-MCG tablet Take 1 tablet by mouth daily.  . QUEtiapine (SEROQUEL) 200 MG tablet Take 1 tablet (200 mg total) by mouth at bedtime.  . sertraline (ZOLOFT) 100 MG tablet Take 2 tablets (200 mg total) by mouth daily.   No facility-administered encounter medications on file as of 03/05/2016.    Past Psychiatric History/Hospitalization(s): Anxiety: Yes Bipolar Disorder: Yes Depression: Yes Mania: Yes Psychosis: No Schizophrenia: No Personality  Disorder: No Hospitalization for psychiatric illness: No History of Electroconvulsive Shock Therapy: No Prior Suicide Attempts: No  Physical Exam: Constitutional:  BP 138/82 mmHg  Pulse 87  Ht 5\' 9"  (1.753 m)  Wt 218 lb 3.2 oz (98.975 kg)  BMI 32.21 kg/m2  General Appearance: alert, oriented, no acute distress  Musculoskeletal: Strength & Muscle Tone: within normal limits Gait & Station: normal Patient leans: straight  Mental Status Examination/Evaluation: Objective: Attitude: Calm and cooperative  Appearance: Fairly Groomed, appears to be stated age  Eye Contact::  Good  Speech:  Clear and Coherent and Normal Rate  Volume:  Normal  Mood:  Depressed and anxious  Affect:  Congruent and Depressed- brighter than at previous visits  Thought Process:  Circumstantial  Orientation:  Full (Time, Place, and Person)  Thought Content:  Negative  Suicidal Thoughts:  No  Homicidal Thoughts:  No  Judgement:  Fair  Insight:  Fair  Concentration: poor  Memory: Immediate-fair Recent-fair to poor  Remote-fair  Recall: fair to poor  Language: fair  Gait and Station: normal  [employer] of Knowledge: average  Psychomotor Activity:  Normal  Akathisia:  No  Handed:  Right  AIMS (if indicated):  n/a   Assets:  Communication Skills Desire for Improvement Financial Resources/Insurance Housing Intimacy Leisure Time Social Support Talents/Skills Transportation       Assessment: AXIS I  Bipolar disorder- currently depressed- moderate and recurrent, GAD and ADD and possibly OCD   AXIS II  Deferred      Treatment Plan/Recommendations:  Plan of Care: Medication management with supportive therapy. Risks/benefits and SE of the medication discussed. Pt verbalized understanding and verbal consent obtained for treatment. Affirm with the patient that the medications are taken as ordered. Patient expressed understanding of how their medications were to be used.      Laboratory:   labs from PCP  Psychotherapy: Therapy: brief supportive therapy provided. Discussed psychosocial stressors in detail.  -discussed health stressors and coping mechanisms in detail  Medications:   Zoloft 200mg  po qD for depression and anxiety  Seroquel 200mg  po qHS for mood lability  Lamtical 100mg  BID for mood lability  Xanax 1mg  po TID prn anxiety  Change Adderall to 30mg  po BID    Routine PRN Medications: No   Consultations: recommend pt f/up with her dermatologist and PCP  Safety Concerns: Pt denies SI and is at an acute low risk for suicide.Patient told to call clinic if any problems occur. Patient advised to go to ER if they should develop SI/HI, side effects, or if symptoms worsen. Has crisis numbers to call if needed. Pt verbalized understanding.   Other: F/up in 3 months or sooner if needed    Charlcie Cradle, MD 03/05/2016

## 2016-03-18 ENCOUNTER — Other Ambulatory Visit (HOSPITAL_COMMUNITY): Payer: Self-pay | Admitting: Psychiatry

## 2016-05-12 ENCOUNTER — Other Ambulatory Visit (HOSPITAL_COMMUNITY): Payer: Self-pay | Admitting: Psychiatry

## 2016-05-12 DIAGNOSIS — F411 Generalized anxiety disorder: Secondary | ICD-10-CM

## 2016-05-15 ENCOUNTER — Other Ambulatory Visit (HOSPITAL_COMMUNITY): Payer: Self-pay

## 2016-05-29 ENCOUNTER — Other Ambulatory Visit (HOSPITAL_COMMUNITY): Payer: Self-pay

## 2016-05-29 DIAGNOSIS — F411 Generalized anxiety disorder: Secondary | ICD-10-CM

## 2016-05-29 DIAGNOSIS — F900 Attention-deficit hyperactivity disorder, predominantly inattentive type: Secondary | ICD-10-CM

## 2016-05-29 DIAGNOSIS — F3130 Bipolar disorder, current episode depressed, mild or moderate severity, unspecified: Secondary | ICD-10-CM

## 2016-05-29 MED ORDER — SERTRALINE HCL 100 MG PO TABS: 200.0000 mg | ORAL_TABLET | Freq: Every day | ORAL | 0 refills | Status: DC

## 2016-05-29 MED ORDER — ALPRAZOLAM 1 MG PO TABS: 1.0000 mg | ORAL_TABLET | Freq: Three times a day (TID) | ORAL | 0 refills | Status: DC | PRN

## 2016-05-29 MED ORDER — AMPHETAMINE-DEXTROAMPHETAMINE 30 MG PO TABS: 30.0000 mg | ORAL_TABLET | Freq: Two times a day (BID) | ORAL | 0 refills | Status: DC

## 2016-05-29 MED ORDER — QUETIAPINE FUMARATE 200 MG PO TABS: 200.0000 mg | ORAL_TABLET | Freq: Every day | ORAL | 0 refills | Status: DC

## 2016-05-29 MED ORDER — LAMOTRIGINE 100 MG PO TABS: 100.0000 mg | ORAL_TABLET | Freq: Two times a day (BID) | ORAL | 0 refills | Status: DC

## 2016-05-29 NOTE — Progress Notes (Signed)
Patient called for refills on all medications, she has a follow up on 9/21 - refill is appropriate according to protocol and I sent to pharmacy for one month. Dr. Casimiro Needle signed for the adderall. I called patient to let her know.

## 2016-06-11 ENCOUNTER — Ambulatory Visit (HOSPITAL_COMMUNITY): Payer: Self-pay | Admitting: Psychiatry

## 2016-06-18 ENCOUNTER — Ambulatory Visit (HOSPITAL_COMMUNITY): Payer: Self-pay | Admitting: Psychiatry

## 2016-06-23 ENCOUNTER — Other Ambulatory Visit (HOSPITAL_COMMUNITY): Payer: Self-pay | Admitting: Psychiatry

## 2016-06-23 DIAGNOSIS — F411 Generalized anxiety disorder: Secondary | ICD-10-CM

## 2016-06-24 DIAGNOSIS — Z01419 Encounter for gynecological examination (general) (routine) without abnormal findings: Secondary | ICD-10-CM | POA: Diagnosis not present

## 2016-06-24 DIAGNOSIS — Z3A01 Less than 8 weeks gestation of pregnancy: Secondary | ICD-10-CM | POA: Diagnosis not present

## 2016-06-24 DIAGNOSIS — O09291 Supervision of pregnancy with other poor reproductive or obstetric history, first trimester: Secondary | ICD-10-CM | POA: Diagnosis not present

## 2016-06-24 DIAGNOSIS — Z1231 Encounter for screening mammogram for malignant neoplasm of breast: Secondary | ICD-10-CM | POA: Diagnosis not present

## 2016-06-25 ENCOUNTER — Other Ambulatory Visit (HOSPITAL_COMMUNITY): Payer: Self-pay | Admitting: Psychiatry

## 2016-06-25 DIAGNOSIS — F3130 Bipolar disorder, current episode depressed, mild or moderate severity, unspecified: Secondary | ICD-10-CM

## 2016-06-25 NOTE — Telephone Encounter (Signed)
We can refill with enough to get to scheduled appt in Dec

## 2016-06-26 ENCOUNTER — Other Ambulatory Visit (HOSPITAL_COMMUNITY): Payer: Self-pay

## 2016-06-26 ENCOUNTER — Telehealth (HOSPITAL_COMMUNITY): Payer: Self-pay

## 2016-06-26 DIAGNOSIS — F900 Attention-deficit hyperactivity disorder, predominantly inattentive type: Secondary | ICD-10-CM

## 2016-06-26 DIAGNOSIS — F3130 Bipolar disorder, current episode depressed, mild or moderate severity, unspecified: Secondary | ICD-10-CM

## 2016-06-26 DIAGNOSIS — F411 Generalized anxiety disorder: Secondary | ICD-10-CM

## 2016-06-26 MED ORDER — AMPHETAMINE-DEXTROAMPHETAMINE 30 MG PO TABS: 30.0000 mg | ORAL_TABLET | Freq: Two times a day (BID) | ORAL | 0 refills | Status: DC

## 2016-06-26 MED ORDER — ALPRAZOLAM 1 MG PO TABS: 1.0000 mg | ORAL_TABLET | Freq: Three times a day (TID) | ORAL | 2 refills | Status: DC | PRN

## 2016-06-26 MED ORDER — QUETIAPINE FUMARATE 200 MG PO TABS: 200.0000 mg | ORAL_TABLET | Freq: Every day | ORAL | 2 refills | Status: DC

## 2016-06-26 MED ORDER — SERTRALINE HCL 100 MG PO TABS: 200.0000 mg | ORAL_TABLET | Freq: Every day | ORAL | 2 refills | Status: DC

## 2016-06-26 NOTE — Progress Notes (Signed)
Patient called for refills on her medications, patient has a follow up in December - per protocol I sent in enough to get her to her appointment. Dr. Casimiro Needle signed for her Adderall.

## 2016-06-26 NOTE — Telephone Encounter (Signed)
Melissa Ochoa picked up her prescription on [id number]  lic  [id number]  dlo

## 2016-07-11 ENCOUNTER — Other Ambulatory Visit (HOSPITAL_COMMUNITY): Payer: Self-pay | Admitting: Psychiatry

## 2016-07-11 DIAGNOSIS — F3130 Bipolar disorder, current episode depressed, mild or moderate severity, unspecified: Secondary | ICD-10-CM

## 2016-07-16 DIAGNOSIS — Z23 Encounter for immunization: Secondary | ICD-10-CM | POA: Diagnosis not present

## 2016-07-16 DIAGNOSIS — D179 Benign lipomatous neoplasm, unspecified: Secondary | ICD-10-CM | POA: Diagnosis not present

## 2016-07-16 DIAGNOSIS — E669 Obesity, unspecified: Secondary | ICD-10-CM | POA: Diagnosis not present

## 2016-07-16 DIAGNOSIS — I10 Essential (primary) hypertension: Secondary | ICD-10-CM | POA: Diagnosis not present

## 2016-07-16 DIAGNOSIS — K5900 Constipation, unspecified: Secondary | ICD-10-CM | POA: Diagnosis not present

## 2016-07-16 DIAGNOSIS — Z6834 Body mass index (BMI) 34.0-34.9, adult: Secondary | ICD-10-CM | POA: Diagnosis not present

## 2016-07-16 DIAGNOSIS — N182 Chronic kidney disease, stage 2 (mild): Secondary | ICD-10-CM | POA: Diagnosis not present

## 2016-07-16 DIAGNOSIS — Z905 Acquired absence of kidney: Secondary | ICD-10-CM | POA: Diagnosis not present

## 2016-07-16 DIAGNOSIS — Q851 Tuberous sclerosis: Secondary | ICD-10-CM | POA: Diagnosis not present

## 2016-07-21 ENCOUNTER — Other Ambulatory Visit (HOSPITAL_COMMUNITY): Payer: Self-pay | Admitting: Psychiatry

## 2016-07-21 DIAGNOSIS — F3130 Bipolar disorder, current episode depressed, mild or moderate severity, unspecified: Secondary | ICD-10-CM

## 2016-07-22 ENCOUNTER — Telehealth (HOSPITAL_COMMUNITY): Payer: Self-pay

## 2016-07-22 DIAGNOSIS — F39 Unspecified mood [affective] disorder: Secondary | ICD-10-CM | POA: Diagnosis not present

## 2016-07-22 DIAGNOSIS — Z6834 Body mass index (BMI) 34.0-34.9, adult: Secondary | ICD-10-CM | POA: Diagnosis not present

## 2016-07-22 DIAGNOSIS — I10 Essential (primary) hypertension: Secondary | ICD-10-CM | POA: Diagnosis not present

## 2016-07-22 DIAGNOSIS — Q851 Tuberous sclerosis: Secondary | ICD-10-CM | POA: Diagnosis not present

## 2016-07-22 DIAGNOSIS — D3000 Benign neoplasm of unspecified kidney: Secondary | ICD-10-CM | POA: Diagnosis not present

## 2016-07-22 DIAGNOSIS — Z23 Encounter for immunization: Secondary | ICD-10-CM | POA: Diagnosis not present

## 2016-07-22 DIAGNOSIS — F900 Attention-deficit hyperactivity disorder, predominantly inattentive type: Secondary | ICD-10-CM | POA: Diagnosis not present

## 2016-07-22 DIAGNOSIS — E668 Other obesity: Secondary | ICD-10-CM | POA: Diagnosis not present

## 2016-07-22 DIAGNOSIS — Z1389 Encounter for screening for other disorder: Secondary | ICD-10-CM | POA: Diagnosis not present

## 2016-07-22 NOTE — Telephone Encounter (Signed)
Patient has an appointment with you in December and is calling for her refill on Adderall, please review and advise, thank you

## 2016-07-23 MED ORDER — AMPHETAMINE-DEXTROAMPHETAMINE 30 MG PO TABS: 30.0000 mg | ORAL_TABLET | Freq: Two times a day (BID) | ORAL | 0 refills | Status: DC

## 2016-07-23 NOTE — Telephone Encounter (Signed)
Yes we can refill 

## 2016-07-23 NOTE — Telephone Encounter (Signed)
Not a good idea to combine Phentermine and Adderall

## 2016-07-23 NOTE — Telephone Encounter (Signed)
Prescription printed, patient called back and said that her PCP wants to give her Phentermine (sp)? But that he would like to make sure it is okay with you, it is for weight loss.

## 2016-07-24 ENCOUNTER — Telehealth (HOSPITAL_COMMUNITY): Payer: Self-pay | Admitting: Psychiatry

## 2016-07-24 NOTE — Telephone Encounter (Signed)
Called patient and let her know she could come and pick up prescription, I also let her know that Dr. Doyne Keel does not advise taking Phentermine while she is taking Adderall. Patient voiced her understanding

## 2016-07-24 NOTE — Telephone Encounter (Signed)
Melissa Ochoa, husband picked up prescription on [id number] lic [id number] dlo

## 2016-08-04 DIAGNOSIS — I10 Essential (primary) hypertension: Secondary | ICD-10-CM | POA: Diagnosis not present

## 2016-08-25 ENCOUNTER — Telehealth (HOSPITAL_COMMUNITY): Payer: Self-pay | Admitting: Psychiatry

## 2016-08-25 ENCOUNTER — Other Ambulatory Visit (HOSPITAL_COMMUNITY): Payer: Self-pay

## 2016-08-25 MED ORDER — AMPHETAMINE-DEXTROAMPHETAMINE 30 MG PO TABS
30.0000 mg | ORAL_TABLET | Freq: Two times a day (BID) | ORAL | 0 refills | Status: DC
Start: 2016-08-25 — End: 2016-09-17

## 2016-08-25 NOTE — Telephone Encounter (Signed)
Medication refill request - Patient left a message requesting a refill of her prescribed Adderall.  States she is out and does not return for next evaluation until 09/17/16.  Left message this would be sent to provider.

## 2016-08-25 NOTE — Telephone Encounter (Signed)
08/25/16  5:35pm Pt's husband Kaliannah Calva [id number] came and pick-up rx script.Marland KitchenMariana Kaufman

## 2016-08-25 NOTE — Telephone Encounter (Signed)
Refilled adderall 30 mg bid # 60 with no additional refills

## 2016-08-25 NOTE — Telephone Encounter (Signed)
Medication management - Telephone call with patient to inform Dr. Lovena Le authorized a refill of her Adderall and her prescription was prepared for pick up.  Provided new address for patient.

## 2016-09-17 ENCOUNTER — Ambulatory Visit (INDEPENDENT_AMBULATORY_CARE_PROVIDER_SITE_OTHER): Payer: No Typology Code available for payment source | Admitting: Psychiatry

## 2016-09-17 ENCOUNTER — Encounter (HOSPITAL_COMMUNITY): Payer: Self-pay | Admitting: Psychiatry

## 2016-09-17 VITALS — BP 126/74 | HR 91 | Ht 69.0 in | Wt 233.0 lb

## 2016-09-17 DIAGNOSIS — Z833 Family history of diabetes mellitus: Secondary | ICD-10-CM | POA: Diagnosis not present

## 2016-09-17 DIAGNOSIS — F3130 Bipolar disorder, current episode depressed, mild or moderate severity, unspecified: Secondary | ICD-10-CM | POA: Diagnosis not present

## 2016-09-17 DIAGNOSIS — F411 Generalized anxiety disorder: Secondary | ICD-10-CM | POA: Diagnosis not present

## 2016-09-17 DIAGNOSIS — Z818 Family history of other mental and behavioral disorders: Secondary | ICD-10-CM

## 2016-09-17 DIAGNOSIS — F988 Other specified behavioral and emotional disorders with onset usually occurring in childhood and adolescence: Secondary | ICD-10-CM | POA: Diagnosis not present

## 2016-09-17 DIAGNOSIS — Z801 Family history of malignant neoplasm of trachea, bronchus and lung: Secondary | ICD-10-CM

## 2016-09-17 DIAGNOSIS — Z79899 Other long term (current) drug therapy: Secondary | ICD-10-CM

## 2016-09-17 MED ORDER — AMPHETAMINE-DEXTROAMPHETAMINE 30 MG PO TABS: 30.0000 mg | ORAL_TABLET | Freq: Two times a day (BID) | ORAL | 0 refills | Status: DC

## 2016-09-17 MED ORDER — QUETIAPINE FUMARATE 200 MG PO TABS: 200.0000 mg | ORAL_TABLET | Freq: Every day | ORAL | 3 refills | Status: DC

## 2016-09-17 MED ORDER — ALPRAZOLAM 1 MG PO TABS: 1.0000 mg | ORAL_TABLET | Freq: Three times a day (TID) | ORAL | 3 refills | Status: DC | PRN

## 2016-09-17 MED ORDER — SERTRALINE HCL 100 MG PO TABS: 200.0000 mg | ORAL_TABLET | Freq: Every day | ORAL | 3 refills | Status: DC

## 2016-09-17 MED ORDER — LAMOTRIGINE 100 MG PO TABS: 100.0000 mg | ORAL_TABLET | Freq: Two times a day (BID) | ORAL | 3 refills | Status: DC

## 2016-09-17 NOTE — Addendum Note (Signed)
Addended by: Charlcie Cradle on: 09/17/2016 02:53 PM   Modules accepted: Orders

## 2016-09-17 NOTE — Progress Notes (Signed)
Patient ID: Melissa Ochoa, female   DOB: 22-Aug-1966, 50 y.o.   MRN: [id number]  [facility] [id number] Progress Note  UMA JUNIPER [id number] 50 y.o.  09/17/2016 2:40 PM  Chief Complaint: I have a new granddaughter  History of Present Illness: reviewed information below with patient on 09/17/16  and same as previous visits except as noted  Pt has a new 1 week old granddaughter. Pt states it has helped a lot.   Adderall helps her be less distracted and more focused. States her comprehension is slow now.  Denies SE from Adderall. Memory remains poor.   Pt states anxiety remains high and she is unable to tolerate any stress. She is overwhelmed and is easily distracted and restless. Pt has a lot of anticipatory anxiety.  Pt is afraid of going into crowds. Pt is having some symptoms of stress induced panic attacks but can usually talk herself out of it. Pt takes 1/2-1 tabs TID of Xanax and it helps. Pt likes to go out with others and prefers not to go anywhere alone. She is slow physically.   Irritability is a little worse and she is snapping at family. This time of the year is overwhelming. Pt unable to tolerate noise. Pt has low frustration tolerance and feels sorry for herself due to her illness  Depression is better overall. It comes on randomly. At those times she feels down for a few hours. She has infrequent crying spells. Reports ongoing isolation and spends most of the time in her bedroom.   Denies manic and hypomanic symptoms including periods of decreased need for sleep, increased energy, mood lability, impulsivity, FOI, and excessive spending.  Pt was getting about 7-8 hrs of sleep with meds. Energy is low and she tires easily. Pt does not nap during the day. Seroquel is helping her to sleep.   Pt takes meds as prescribed and denies SE.   She is very concerned about her weight gain. Pt is working on a diet and exercise plan with her daughter.   Suicidal Ideation:  No  wants to live for her kids.  Plan Formed: No Patient has means to carry out plan: No  Homicidal Ideation: No Plan Formed: No Patient has means to carry out plan: No  Review of Systems: Psychiatric: Agitation: Yes Hallucination: No Depressed Mood: Yes Insomnia: No Hypersomnia: No Altered Concentration: Yes Feels Worthless: Yes Grandiose Ideas: No Belief In Special Powers: No New/Increased Substance Abuse: No Compulsions: No    Review of Systems  Gastrointestinal: Positive for heartburn and nausea. Negative for abdominal pain and vomiting.  Musculoskeletal: Positive for falls and myalgias. Negative for back pain, joint pain and neck pain.  Neurological: Negative for dizziness, tremors, sensory change, seizures and headaches.  Psychiatric/Behavioral: Positive for depression and memory loss. Negative for hallucinations, substance abuse and suicidal ideas. The patient is nervous/anxious. The patient does not have insomnia.      Past Medical, Family, Social History:  reviewed information with patient on 09/17/16  and same as previous visits except as noted  Married with kids. Unemployed and on disability.   reports that she has never smoked. She has never used smokeless tobacco. She reports that she does not drink alcohol or use drugs.  Family History  Problem Relation Age of Onset  . Lung cancer Father     smoker  . Diabetes      a lot of relatives  . Anxiety disorder Mother   . ADD / ADHD Mother   .  Depression Mother   . Suicidality Neg Hx   . ADD / ADHD Daughter   . Bipolar disorder Daughter    Past Medical History:  Diagnosis Date  . ADHD (attention deficit hyperactivity disorder)   . Anxiety   . Bipolar disorder ([place])   . Cervical disc disease   . Colon polyps    adenomatous  . Depression   . Hypertension   . Iron deficiency   . Kidney disease    renal angiomyolipomas  . Liver cyst   . Melanosis   . Tuberous sclerosis [facility])     Outpatient Encounter  Prescriptions as of 09/17/2016  Medication Sig  . ALPRAZolam (XANAX) 1 MG tablet Take 1 tablet (1 mg total) by mouth 3 (three) times daily as needed for anxiety.  Marland Kitchen amphetamine-dextroamphetamine (ADDERALL) 30 MG tablet Take 1 tablet by mouth 2 (two) times daily.  Marland Kitchen labetalol (NORMODYNE) 200 MG tablet Take 200 mg by mouth 3 (three) times daily.  Marland Kitchen lamoTRIgine (LAMICTAL) 100 MG tablet TAKE 1 TABLET BY MOUTH TWICE A DAY  . losartan (COZAAR) 100 MG tablet Take 100 mg by mouth daily.  . norethindrone-ethinyl estradiol (JUNEL FE,GILDESS FE,LOESTRIN FE) 1-20 MG-MCG tablet Take 1 tablet by mouth daily.  . QUEtiapine (SEROQUEL) 200 MG tablet Take 1 tablet (200 mg total) by mouth at bedtime.  . sertraline (ZOLOFT) 100 MG tablet Take 2 tablets (200 mg total) by mouth daily.  Marland Kitchen amLODipine (NORVASC) 10 MG tablet Take 10 mg by mouth daily.  Marland Kitchen everolimus (AFINITOR) 5 MG tablet Take 5 mg by mouth daily.  . hydrochlorothiazide (MICROZIDE) 12.5 MG capsule Take 12.5 mg by mouth daily.  . Linaclotide (LINZESS) 290 MCG CAPS capsule Take 1 capsule (290 mcg total) by mouth daily. (Patient not taking: Reported on 09/17/2016)   No facility-administered encounter medications on file as of 09/17/2016.     Past Psychiatric History/Hospitalization(s): Anxiety: Yes Bipolar Disorder: Yes Depression: Yes Mania: Yes Psychosis: No Schizophrenia: No Personality Disorder: No Hospitalization for psychiatric illness: No History of Electroconvulsive Shock Therapy: No Prior Suicide Attempts: No  Physical Exam: Constitutional:  BP 126/74   Pulse 91   Ht 5\' 9"  (1.753 m)   Wt 233 lb (105.7 kg)   BMI 34.41 kg/m   General Appearance: alert, oriented, no acute distress  Musculoskeletal: Strength & Muscle Tone: within normal limits Gait & Station: normal Patient leans: straight  Mental Status Examination/Evaluation: reviewed MSE on 09/17/16  and same as previous visits except as noted  Objective: Attitude: Calm and  cooperative  Appearance: Fairly Groomed, appears to be stated age  Eye Contact::  Good  Speech:  Clear and Coherent and Normal Rate  Volume:  Normal  Mood:  Depressed and anxious  Affect:  Congruent and Depressed- brighter than at previous visits  Thought Process:  Circumstantial  Orientation:  Full (Time, Place, and Person)  Thought Content:  Negative  Suicidal Thoughts:  No  Homicidal Thoughts:  No  Judgement:  Fair  Insight:  Fair  Concentration: poor  Memory: Immediate-fair Recent-fair to poor Remote-fair  Recall: fair to poor  Language: fair  Gait and Station: normal  [employer] Knowledge: average  Psychomotor Activity:  Normal  Akathisia:  No  Handed:  Right  AIMS (if indicated):  n/a   Assets:  Communication Skills Desire for Improvement Financial Resources/Insurance [place] Talents/Skills Transportation      Reviewed A&P below on 09/17/16  and same as previous visits except as  noted  Assessment: AXIS I  Bipolar disorder- currently depressed- moderate and recurrent, GAD and ADD and possibly OCD   AXIS II  Deferred      Treatment Plan/Recommendations:  Plan of Care: Medication management with supportive therapy. Risks/benefits and SE of the medication discussed. Pt verbalized understanding and verbal consent obtained for treatment. Affirm with the patient that the medications are taken as ordered. Patient expressed understanding of how their medications were to be used.      Laboratory:  labs from PCP  Psychotherapy: Therapy: brief supportive therapy provided. Discussed psychosocial stressors in detail.  -discussed health stressors and coping mechanisms in detail  Medications:   Zoloft 200mg  po qD for depression and anxiety  Seroquel 200mg  po qHS for mood lability  Lamtical 100mg  BID for mood lability  Xanax 1mg  po TID prn anxiety   Adderall to 30mg  po BID    Routine PRN Medications: No   Consultations:  recommend pt f/up with her dermatologist and PCP  Safety Concerns: Pt denies SI and is at an acute low risk for suicide.Patient told to call clinic if any problems occur. Patient advised to go to ER if they should develop SI/HI, side effects, or if symptoms worsen. Has crisis numbers to call if needed. Pt verbalized understanding.   Other: F/up in 3 months or sooner if needed    Charlcie Cradle, MD 09/17/2016

## 2016-10-12 ENCOUNTER — Other Ambulatory Visit (HOSPITAL_COMMUNITY): Payer: Self-pay

## 2016-10-12 DIAGNOSIS — F3130 Bipolar disorder, current episode depressed, mild or moderate severity, unspecified: Secondary | ICD-10-CM

## 2016-10-12 MED ORDER — SERTRALINE HCL 100 MG PO TABS: 200.0000 mg | ORAL_TABLET | Freq: Every day | ORAL | 0 refills | Status: DC

## 2016-11-11 ENCOUNTER — Telehealth (HOSPITAL_COMMUNITY): Payer: Self-pay

## 2016-11-11 DIAGNOSIS — F988 Other specified behavioral and emotional disorders with onset usually occurring in childhood and adolescence: Secondary | ICD-10-CM

## 2016-11-11 NOTE — Telephone Encounter (Signed)
Patient is calling for a refill on her Adderall. Patient is due and has a follow up on 3/15. Please advise, thank you

## 2016-11-12 ENCOUNTER — Telehealth (HOSPITAL_COMMUNITY): Payer: Self-pay | Admitting: Psychiatry

## 2016-11-12 MED ORDER — AMPHETAMINE-DEXTROAMPHETAMINE 30 MG PO TABS: 30.0000 mg | ORAL_TABLET | Freq: Two times a day (BID) | ORAL | 0 refills | Status: DC

## 2016-11-12 NOTE — Telephone Encounter (Signed)
Melissa Ochoa, spouse picked up prescription [id number] lic [id number] dlo

## 2016-11-12 NOTE — Telephone Encounter (Signed)
Yes we can refill 

## 2016-12-10 ENCOUNTER — Encounter (HOSPITAL_COMMUNITY): Payer: Self-pay | Admitting: Psychiatry

## 2016-12-10 ENCOUNTER — Ambulatory Visit (INDEPENDENT_AMBULATORY_CARE_PROVIDER_SITE_OTHER): Payer: No Typology Code available for payment source | Admitting: Psychiatry

## 2016-12-10 DIAGNOSIS — F909 Attention-deficit hyperactivity disorder, unspecified type: Secondary | ICD-10-CM

## 2016-12-10 DIAGNOSIS — Z818 Family history of other mental and behavioral disorders: Secondary | ICD-10-CM | POA: Diagnosis not present

## 2016-12-10 DIAGNOSIS — F411 Generalized anxiety disorder: Secondary | ICD-10-CM

## 2016-12-10 DIAGNOSIS — F988 Other specified behavioral and emotional disorders with onset usually occurring in childhood and adolescence: Secondary | ICD-10-CM

## 2016-12-10 DIAGNOSIS — F3130 Bipolar disorder, current episode depressed, mild or moderate severity, unspecified: Secondary | ICD-10-CM

## 2016-12-10 MED ORDER — LAMOTRIGINE 100 MG PO TABS: 100.0000 mg | ORAL_TABLET | Freq: Two times a day (BID) | ORAL | 3 refills | Status: DC

## 2016-12-10 MED ORDER — AMPHETAMINE-DEXTROAMPHETAMINE 30 MG PO TABS: 30.0000 mg | ORAL_TABLET | Freq: Two times a day (BID) | ORAL | 0 refills | Status: DC

## 2016-12-10 MED ORDER — ALPRAZOLAM 1 MG PO TABS: 1.0000 mg | ORAL_TABLET | Freq: Three times a day (TID) | ORAL | 3 refills | Status: DC | PRN

## 2016-12-10 MED ORDER — QUETIAPINE FUMARATE 200 MG PO TABS: 200.0000 mg | ORAL_TABLET | Freq: Every day | ORAL | 3 refills | Status: DC

## 2016-12-10 MED ORDER — SERTRALINE HCL 100 MG PO TABS: 200.0000 mg | ORAL_TABLET | Freq: Every day | ORAL | 0 refills | Status: DC

## 2016-12-10 NOTE — Addendum Note (Signed)
Addended by: Charlcie Cradle on: 12/10/2016 11:25 AM   Modules accepted: Orders

## 2016-12-10 NOTE — Progress Notes (Signed)
Patient ID: Melissa Ochoa, female   DOB: 1966/07/18, 51 y.o.   MRN: [id number]  [facility] [address] Progress Note  Melissa Ochoa [id number] 51 y.o.  12/10/2016 11:14 AM  Chief Complaint: you know I have been doing ok  History of Present Illness: reviewed information below with patient on 12/10/16  and same as previous visits except as noted  Pt reports she is doing well and having her granddaughter has helped a lot.   She still gets anxious and overwhelmed easily. She is no longer having panic attacks.  Pt takes Xanax 1/2-1 tab 3x/day.   Irritability is unchanged.   Depression comes and goes. About twice a week she feels down for a whole day. On those days she isolates in her room and has low motivation.   Denies manic and hypomanic symptoms including periods of decreased need for sleep, increased energy, mood lability, impulsivity, FOI, and excessive spending.  Sleep is good. Energy is low but pt states she is always busy. Appetite is good and she eats sweets late at night. Pt has gained weight.   Pt is taking Adderall as prescribed and states it helps a lot.   Taking meds as prescribed and denies SE.     Suicidal Ideation: No  wants to live for her kids.  Plan Formed: No Patient has means to carry out plan: No  Homicidal Ideation: No Plan Formed: No Patient has means to carry out plan: No  Review of Systems: Psychiatric: Agitation: Yes Hallucination: No Depressed Mood: Yes Insomnia: No Hypersomnia: No Altered Concentration: Yes Feels Worthless: Yes Grandiose Ideas: No Belief In Special Powers: No New/Increased Substance Abuse: No Compulsions: No    Review of Systems  Constitutional: Negative for chills, fever and malaise/fatigue.  HENT: Positive for sinus pain. Negative for congestion, ear pain, nosebleeds and sore throat.   Neurological: Positive for dizziness and headaches. Negative for tremors, sensory change, seizures, loss of  consciousness and weakness.  Psychiatric/Behavioral: Positive for depression. Negative for hallucinations, substance abuse and suicidal ideas. The patient is nervous/anxious. The patient does not have insomnia.      Past Medical, Family, Social History:  reviewed information with patient on 12/10/16  and same as previous visits except as noted  Married with kids. Unemployed and on disability.   reports that she has never smoked. She has never used smokeless tobacco. She reports that she does not drink alcohol or use drugs.  Family History  Problem Relation Age of Onset  . Lung cancer Father     smoker  . Anxiety disorder Mother   . ADD / ADHD Mother   . Depression Mother   . Diabetes      a lot of relatives  . ADD / ADHD Daughter   . Bipolar disorder Daughter   . Suicidality Neg Hx    Past Medical History:  Diagnosis Date  . ADHD (attention deficit hyperactivity disorder)   . Anxiety   . Bipolar disorder ([place])   . Cervical disc disease   . Colon polyps    adenomatous  . Depression   . Hypertension   . Iron deficiency   . Kidney disease    renal angiomyolipomas  . Liver cyst   . Melanosis   . 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 ALPRAZolam (XANAX) 1 MG tablet Take 1 tablet (1 mg total) by mouth 3 (three) times daily as needed for anxiety.  Marland Kitchen  amLODipine (NORVASC) 10 MG tablet Take 10 mg by mouth daily.  Marland Kitchen amphetamine-dextroamphetamine (ADDERALL) 30 MG tablet Take 1 tablet by mouth 2 (two) times daily.  . hydrochlorothiazide (MICROZIDE) 12.5 MG capsule Take 12.5 mg by mouth daily.  Marland Kitchen labetalol (NORMODYNE) 200 MG tablet Take 200 mg by mouth 3 (three) times daily.  Marland Kitchen lamoTRIgine (LAMICTAL) 100 MG tablet Take 1 tablet (100 mg total) by mouth 2 (two) times daily.  . norethindrone-ethinyl estradiol (JUNEL FE,GILDESS FE,LOESTRIN FE) 1-20 MG-MCG tablet Take 1 tablet by mouth daily.  . QUEtiapine (SEROQUEL) 200 MG  tablet Take 1 tablet (200 mg total) by mouth at bedtime.  . sertraline (ZOLOFT) 100 MG tablet Take 2 tablets (200 mg total) by mouth daily.  Marland Kitchen everolimus (AFINITOR) 5 MG tablet Take 5 mg by mouth daily.  . Linaclotide (LINZESS) 290 MCG CAPS capsule Take 1 capsule (290 mcg total) by mouth daily. (Patient not taking: Reported on 09/17/2016)  . losartan (COZAAR) 100 MG tablet Take 100 mg by mouth daily.   No facility-administered encounter medications on file as of 12/10/2016.     Past Psychiatric History/Hospitalization(s): Anxiety: Yes Bipolar Disorder: Yes Depression: Yes Mania: Yes Psychosis: No Schizophrenia: No Personality Disorder: No Hospitalization for psychiatric illness: No History of Electroconvulsive Shock Therapy: No Prior Suicide Attempts: No  Physical Exam: Constitutional:  BP 128/74   Pulse 85   Ht 5\' 9"  (1.753 m)   Wt 236 lb 12.8 oz (107.4 kg)   BMI 34.97 kg/m   General Appearance: alert, oriented, no acute distress  Musculoskeletal: Strength & Muscle Tone: within normal limits Gait & Station: normal Patient leans: straight  Mental Status Examination/Evaluation: reviewed MSE on 12/10/16  and same as previous visits except as noted  Objective: Attitude: Calm and cooperative  Appearance: Fairly Groomed, appears to be stated age  Eye Contact::  Good  Speech:  Clear and Coherent and Normal Rate  Volume:  Normal  Mood:  Depressed and anxious  Affect:  Congruent- brighter than at previous visits  Thought Process:  Circumstantial  Orientation:  Full (Time, Place, and Person)  Thought Content:  Negative  Suicidal Thoughts:  No  Homicidal Thoughts:  No  Judgement:  Fair  Insight:  Fair  Concentration: poor  Memory: Immediate-fair Recent-fair to poor Remote-fair  Recall: fair to poor  Language: fair  Gait and Station: normal  [employer] of Knowledge: average  Psychomotor Activity:  Normal  Akathisia:  No  Handed:  Right  AIMS (if indicated):   n/a   Assets:  Communication Skills Desire for Improvement Financial Resources/Insurance Housing Intimacy Leisure Time Social Support Talents/Skills Transportation      Reviewed A&P below on 12/10/16  and same as previous visits except as noted  Assessment: AXIS I  Bipolar disorder- currently depressed- moderate and recurrent, GAD and ADD and possibly OCD   AXIS II  Deferred      Treatment Plan/Recommendations:  Plan of Care: Medication management with supportive therapy. Risks/benefits and SE of the medication discussed. Pt verbalized understanding and verbal consent obtained for treatment. Affirm with the patient that the medications are taken as ordered. Patient expressed understanding of how their medications were to be used.      Laboratory:  labs from PCP  Psychotherapy: Therapy: brief supportive therapy provided. Discussed psychosocial stressors in detail.  -discussed health stressors and coping mechanisms in detail  Medications:   Zoloft 200mg  po qD for depression and anxiety  Seroquel 200mg  po qHS for mood lability  Lamtical  100mg  BID for mood lability  Xanax 1mg  po TID prn anxiety   Adderall to 30mg  po BID Pt does not want meds changes today   Routine PRN Medications: No   Consultations: recommend pt f/up with her dermatologist and PCP  Safety Concerns: Pt denies SI and is at an acute low risk for suicide.Patient told to call clinic if any problems occur. Patient advised to go to ER if they should develop SI/HI, side effects, or if symptoms worsen. Has crisis numbers to call if needed. Pt verbalized understanding.   Other: F/up in 3 months or sooner if needed    Charlcie Cradle, MD 12/10/2016

## 2017-01-19 DIAGNOSIS — I10 Essential (primary) hypertension: Secondary | ICD-10-CM | POA: Diagnosis not present

## 2017-01-19 DIAGNOSIS — R8299 Other abnormal findings in urine: Secondary | ICD-10-CM | POA: Diagnosis not present

## 2017-01-26 DIAGNOSIS — D3000 Benign neoplasm of unspecified kidney: Secondary | ICD-10-CM | POA: Diagnosis not present

## 2017-01-26 DIAGNOSIS — Z0000 Encounter for general adult medical examination without abnormal findings: Secondary | ICD-10-CM | POA: Diagnosis not present

## 2017-01-26 DIAGNOSIS — I10 Essential (primary) hypertension: Secondary | ICD-10-CM | POA: Diagnosis not present

## 2017-01-26 DIAGNOSIS — Q851 Tuberous sclerosis: Secondary | ICD-10-CM | POA: Diagnosis not present

## 2017-02-02 DIAGNOSIS — Z1212 Encounter for screening for malignant neoplasm of rectum: Secondary | ICD-10-CM | POA: Diagnosis not present

## 2017-03-18 ENCOUNTER — Ambulatory Visit (INDEPENDENT_AMBULATORY_CARE_PROVIDER_SITE_OTHER): Payer: No Typology Code available for payment source | Admitting: Psychiatry

## 2017-03-18 ENCOUNTER — Encounter (HOSPITAL_COMMUNITY): Payer: Self-pay | Admitting: Psychiatry

## 2017-03-18 DIAGNOSIS — F411 Generalized anxiety disorder: Secondary | ICD-10-CM

## 2017-03-18 DIAGNOSIS — F3130 Bipolar disorder, current episode depressed, mild or moderate severity, unspecified: Secondary | ICD-10-CM

## 2017-03-18 DIAGNOSIS — Z818 Family history of other mental and behavioral disorders: Secondary | ICD-10-CM | POA: Diagnosis not present

## 2017-03-18 DIAGNOSIS — F3132 Bipolar disorder, current episode depressed, moderate: Secondary | ICD-10-CM

## 2017-03-18 DIAGNOSIS — Z79899 Other long term (current) drug therapy: Secondary | ICD-10-CM | POA: Diagnosis not present

## 2017-03-18 DIAGNOSIS — F988 Other specified behavioral and emotional disorders with onset usually occurring in childhood and adolescence: Secondary | ICD-10-CM

## 2017-03-18 MED ORDER — QUETIAPINE FUMARATE 300 MG PO TABS: 300.0000 mg | ORAL_TABLET | Freq: Every day | ORAL | 0 refills | Status: DC

## 2017-03-18 MED ORDER — LAMOTRIGINE 100 MG PO TABS: 100.0000 mg | ORAL_TABLET | Freq: Two times a day (BID) | ORAL | 0 refills | Status: DC

## 2017-03-18 MED ORDER — AMPHETAMINE-DEXTROAMPHETAMINE 30 MG PO TABS: 30.0000 mg | ORAL_TABLET | Freq: Two times a day (BID) | ORAL | 0 refills | Status: DC

## 2017-03-18 MED ORDER — ALPRAZOLAM 1 MG PO TABS: 1.0000 mg | ORAL_TABLET | Freq: Three times a day (TID) | ORAL | 3 refills | Status: DC | PRN

## 2017-03-18 MED ORDER — SERTRALINE HCL 100 MG PO TABS: 200.0000 mg | ORAL_TABLET | Freq: Every day | ORAL | 0 refills | Status: DC

## 2017-03-18 NOTE — Progress Notes (Signed)
Patient ID: Melissa Ochoa, female   DOB: 1966/02/06, 51 y.o.   MRN: [id number]  [facility] [address] Progress Note  Melissa Ochoa [id number] 51 y.o.  03/18/2017 3:39 PM  Chief Complaint: I have had a few manic attacks  History of Present Illness:  Pt here with her infant grand daughter.  Pt states she will randomly get overwhelmed. She will get irritable and will yell. Pt responds by isolating herself in her room. It leads to depression and worthlessness. Depression is getting worse but her being with her grand daughter helps so much. Pt feels lonely. She denies SI/HI.  Sleep is mostly fair. Energy is low.   Lately concentration is poor despite treatment with Adderall. She is forgetful and distracted.  Anxiety remains high. States it has always been this way and always will be this way.  Taking meds as prescribed and denies SE.    Review of Systems  Constitutional: Positive for malaise/fatigue. Negative for chills and fever.  Musculoskeletal: Positive for back pain, joint pain, myalgias and neck pain.  Skin: Positive for rash.  Neurological: Positive for headaches.  Endo/Heme/Allergies: Negative for environmental allergies and polydipsia. Bruises/bleeds easily.  Psychiatric/Behavioral: Positive for depression. Negative for hallucinations, substance abuse and suicidal ideas. The patient is nervous/anxious. The patient does not have insomnia.      Past Medical, Family, Social History:  reviewed information with patient on 03/18/17  and same as previous visits except as noted Married with kids. Unemployed and on disability.   reports that she has never smoked. She has never used smokeless tobacco. She reports that she does not drink alcohol or use drugs.  Family History  Problem Relation Age of Onset  . Lung cancer Father        smoker  . Anxiety disorder Mother   . ADD / ADHD Mother   . Depression Mother   . Diabetes Unknown        a lot of relatives  .  ADD / ADHD Daughter   . Bipolar disorder Daughter   . Suicidality Neg Hx    Past Medical History:  Diagnosis Date  . ADHD (attention deficit hyperactivity disorder)   . Anxiety   . Bipolar disorder ([place])   . Cervical disc disease   . Colon polyps    adenomatous  . Depression   . Hypertension   . Iron deficiency   . Kidney disease    renal angiomyolipomas  . Liver cyst   . Melanosis   . 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 ALPRAZolam (XANAX) 1 MG tablet Take 1 tablet (1 mg total) by mouth 3 (three) times daily as needed for anxiety.  Marland Kitchen amLODipine (NORVASC) 10 MG tablet Take 10 mg by mouth daily.  Marland Kitchen amphetamine-dextroamphetamine (ADDERALL) 30 MG tablet Take 1 tablet by mouth 2 (two) times daily.  Marland Kitchen amphetamine-dextroamphetamine (ADDERALL) 30 MG tablet Take 1 tablet by mouth 2 (two) times daily.  Marland Kitchen everolimus (AFINITOR) 5 MG tablet Take 5 mg by mouth daily.  . hydrochlorothiazide (MICROZIDE) 12.5 MG capsule Take 12.5 mg by mouth daily.  Marland Kitchen labetalol (NORMODYNE) 200 MG tablet Take 200 mg by mouth 3 (three) times daily.  Marland Kitchen lamoTRIgine (LAMICTAL) 100 MG tablet Take 1 tablet (100 mg total) by mouth 2 (two) times daily.  . Linaclotide (LINZESS) 290 MCG CAPS capsule Take 1 capsule (290 mcg total) by mouth daily.  Marland Kitchen losartan (COZAAR) 100 MG tablet  Take 100 mg by mouth daily.  . norethindrone-ethinyl estradiol (JUNEL FE,GILDESS FE,LOESTRIN FE) 1-20 MG-MCG tablet Take 1 tablet by mouth daily.  . QUEtiapine (SEROQUEL) 200 MG tablet Take 1 tablet (200 mg total) by mouth at bedtime.  . sertraline (ZOLOFT) 100 MG tablet Take 2 tablets (200 mg total) by mouth daily.   No facility-administered encounter medications on file as of 03/18/2017.     Past Psychiatric History/Hospitalization(s): Anxiety: Yes Bipolar Disorder: Yes Depression: Yes Mania: Yes Psychosis: No Schizophrenia: No Personality Disorder: No Hospitalization for  psychiatric illness: No History of Electroconvulsive Shock Therapy: No Prior Suicide Attempts: No  Physical Exam: Constitutional:  BP (!) 140/100   Pulse 91   Ht 5\' 9"  (1.753 m)   Wt 229 lb (103.9 kg)   BMI 33.82 kg/m   General Appearance: alert, oriented, no acute distress  Musculoskeletal: Strength & Muscle Tone: within normal limits Gait & Station: normal Patient leans: N/A  Mental Status Examination/Evaluation:   Objective: Attitude: Calm and cooperative  Appearance: Fairly Groomed, appears to be stated age  Eye Contact::  Good  Speech:  Clear and Coherent and Normal Rate  Volume:  Normal  Mood:  Depressed and anxious  Affect:  Depressed  Thought Process:  Goal Directed and Descriptions of Associations: Intact  Orientation:  Full (Time, Place, and Person)  Thought Content:  Logical  Suicidal Thoughts:  No  Homicidal Thoughts:  No  Judgement:  Intact  Insight:  Present  Concentration: poor  Memory: Immediate-fair Recent-fair to poor Remote-fair  Recall: fair to poor  Language: fair  Gait and Station: normal  [employer] of Knowledge: normal  Psychomotor Activity:  Normal  Akathisia:  No  Handed:  Right  AIMS (if indicated):  n/a   Assets:  Communication Skills Desire for Improvement Social Support Talents/Skills Transportation      Reviewed A&P below on 03/18/17  and same as previous visits except where noted  Assessment: AXIS I  Bipolar disorder- currently depressed- moderate and recurrent, GAD and ADD and possibly OCD   AXIS II  Deferred      Treatment Plan/Recommendations:  Plan of Care:  Medication management with supportive therapy. Risks/benefits and SE of the medication discussed. Pt verbalized understanding and verbal consent obtained for treatment.  Affirm with the patient that the medications are taken as ordered. Patient expressed understanding of how their medications were to be used.  -worsening of symptoms   Laboratory:  labs  from PCP  Psychotherapy: Therapy: brief supportive therapy provided. Discussed psychosocial stressors in detail.  -discussed health stressors and coping mechanisms in detail  Medications:   Zoloft 200mg  po qD for depression and anxiety Increase Serqouel to 300mg  po qHS for Bipolar disorder Lamictal 100mg  po BID for Bipolar disorder Xanax 1mg  po TID prn anxiety Adderall 30mg  po BID for ADD    Routine PRN Medications: No   Consultations: recommend pt f/up with her dermatologist and PCP  Safety Concerns: Pt denies SI and is at an acute low risk for suicide.Patient told to call clinic if any problems occur. Patient advised to go to ER if they should develop SI/HI, side effects, or if symptoms worsen. Has crisis numbers to call if needed. Pt verbalized understanding.   Other: F/up in 2 months or sooner if needed    Charlcie Cradle, MD 03/18/2017

## 2017-04-20 DIAGNOSIS — L259 Unspecified contact dermatitis, unspecified cause: Secondary | ICD-10-CM | POA: Diagnosis not present

## 2017-04-20 DIAGNOSIS — I10 Essential (primary) hypertension: Secondary | ICD-10-CM | POA: Diagnosis not present

## 2017-04-20 DIAGNOSIS — R51 Headache: Secondary | ICD-10-CM | POA: Diagnosis not present

## 2017-04-20 DIAGNOSIS — R61 Generalized hyperhidrosis: Secondary | ICD-10-CM | POA: Diagnosis not present

## 2017-04-20 DIAGNOSIS — R519 Headache, unspecified: Secondary | ICD-10-CM | POA: Insufficient documentation

## 2017-05-27 ENCOUNTER — Encounter (HOSPITAL_COMMUNITY): Payer: Self-pay | Admitting: Psychiatry

## 2017-05-27 ENCOUNTER — Ambulatory Visit (INDEPENDENT_AMBULATORY_CARE_PROVIDER_SITE_OTHER): Payer: No Typology Code available for payment source | Admitting: Psychiatry

## 2017-05-27 DIAGNOSIS — F3130 Bipolar disorder, current episode depressed, mild or moderate severity, unspecified: Secondary | ICD-10-CM

## 2017-05-27 DIAGNOSIS — M542 Cervicalgia: Secondary | ICD-10-CM | POA: Diagnosis not present

## 2017-05-27 DIAGNOSIS — F988 Other specified behavioral and emotional disorders with onset usually occurring in childhood and adolescence: Secondary | ICD-10-CM

## 2017-05-27 DIAGNOSIS — F411 Generalized anxiety disorder: Secondary | ICD-10-CM | POA: Diagnosis not present

## 2017-05-27 DIAGNOSIS — M791 Myalgia: Secondary | ICD-10-CM

## 2017-05-27 DIAGNOSIS — M549 Dorsalgia, unspecified: Secondary | ICD-10-CM | POA: Diagnosis not present

## 2017-05-27 DIAGNOSIS — Z818 Family history of other mental and behavioral disorders: Secondary | ICD-10-CM

## 2017-05-27 MED ORDER — SERTRALINE HCL 100 MG PO TABS: 200.0000 mg | ORAL_TABLET | Freq: Every day | ORAL | 0 refills | Status: DC

## 2017-05-27 MED ORDER — AMPHETAMINE-DEXTROAMPHETAMINE 30 MG PO TABS: 30.0000 mg | ORAL_TABLET | Freq: Two times a day (BID) | ORAL | 0 refills | Status: DC

## 2017-05-27 MED ORDER — ALPRAZOLAM 1 MG PO TABS: 1.0000 mg | ORAL_TABLET | Freq: Three times a day (TID) | ORAL | 2 refills | Status: DC | PRN

## 2017-05-27 MED ORDER — QUETIAPINE FUMARATE 300 MG PO TABS: 300.0000 mg | ORAL_TABLET | Freq: Every day | ORAL | 0 refills | Status: DC

## 2017-05-27 MED ORDER — LAMOTRIGINE 100 MG PO TABS: 100.0000 mg | ORAL_TABLET | Freq: Two times a day (BID) | ORAL | 0 refills | Status: DC

## 2017-05-27 NOTE — Progress Notes (Signed)
Patient ID: Melissa Ochoa, female   DOB: Nov 24, 1965, 51 y.o.   MRN: [id number]  [facility] [address] Progress Note  Melissa Ochoa [id number] 51 y.o.  05/27/2017 3:25 PM  Chief Complaint: I have had a few manic attacks  History of Present Illness:  Pt here with her infant grand daughter.  Pt feels like she is always rushed.    Pt was in [place] in July and while there she was depressed and irritable. She is not able to handle travel now a days. Depression comes and goes. It is difficult to manage change. She notes it happens more at night. She likes to be in her room and isolate at night. Pt denies anhedonia. Pt denies SI/HI.  Now sleep is good. Energy is generally pretty limited.   Denies manic and hypomanic symptoms including periods of decreased need for sleep, increased energy, mood lability, impulsivity, FOI, and excessive spending.  Pt is easily overwhelmed and anxious. She will get irritable and snap at families.   Pt is taking Adderall and states it helps with concentration.   Taking meds as prescribed and denies SE.     Review of Systems  Musculoskeletal: Positive for back pain, joint pain, myalgias and neck pain.  Neurological: Negative for dizziness, speech change and headaches.  Psychiatric/Behavioral: Positive for depression. Negative for hallucinations, substance abuse and suicidal ideas. The patient is nervous/anxious. The patient does not have insomnia.      Past Medical, Family, Social History:  reviewed information with patient on 05/27/17  and same as previous visits except as noted Married with kids. Unemployed and on disability.   reports that she has never smoked. She has never used smokeless tobacco. She reports that she does not drink alcohol or use drugs.  Family History  Problem Relation Age of Onset  . Lung cancer Father        smoker  . Anxiety disorder Mother   . ADD / ADHD Mother   . Depression Mother   . Diabetes Unknown         a lot of relatives  . ADD / ADHD Daughter   . Bipolar disorder Daughter   . Suicidality Neg Hx    Past Medical History:  Diagnosis Date  . ADHD (attention deficit hyperactivity disorder)   . Anxiety   . Bipolar disorder ([place])   . Cervical disc disease   . Colon polyps    adenomatous  . Depression   . Hypertension   . Iron deficiency   . Kidney disease    renal angiomyolipomas  . Liver cyst   . Melanosis   . 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 ALPRAZolam (XANAX) 1 MG tablet Take 1 tablet (1 mg total) by mouth 3 (three) times daily as needed for anxiety.  Marland Kitchen amLODipine (NORVASC) 10 MG tablet Take 10 mg by mouth daily.  Marland Kitchen amphetamine-dextroamphetamine (ADDERALL) 30 MG tablet Take 1 tablet by mouth 2 (two) times daily.  Marland Kitchen amphetamine-dextroamphetamine (ADDERALL) 30 MG tablet Take 1 tablet by mouth 2 (two) times daily.  Marland Kitchen everolimus (AFINITOR) 5 MG tablet Take 5 mg by mouth daily.  . hydrochlorothiazide (MICROZIDE) 12.5 MG capsule Take 12.5 mg by mouth daily.  Marland Kitchen labetalol (NORMODYNE) 200 MG tablet Take 200 mg by mouth 3 (three) times daily.  Marland Kitchen lamoTRIgine (LAMICTAL) 100 MG tablet Take 1 tablet (100 mg total) by mouth 2 (two) times daily.  . Linaclotide (  LINZESS) 290 MCG CAPS capsule Take 1 capsule (290 mcg total) by mouth daily.  Marland Kitchen losartan (COZAAR) 100 MG tablet Take 100 mg by mouth daily.  . norethindrone-ethinyl estradiol (JUNEL FE,GILDESS FE,LOESTRIN FE) 1-20 MG-MCG tablet Take 1 tablet by mouth daily.  . QUEtiapine (SEROQUEL) 300 MG tablet Take 1 tablet (300 mg total) by mouth at bedtime.  . sertraline (ZOLOFT) 100 MG tablet Take 2 tablets (200 mg total) by mouth daily.   No facility-administered encounter medications on file as of 05/27/2017.     Past Psychiatric History/Hospitalization(s): Anxiety: Yes Bipolar Disorder: Yes Depression: Yes Mania: Yes Psychosis: No Schizophrenia: No Personality Disorder:  No Hospitalization for psychiatric illness: No History of Electroconvulsive Shock Therapy: No Prior Suicide Attempts: No  Physical Exam: Constitutional:  BP 132/80   Pulse 90   Ht 5\' 9"  (1.753 m)   Wt 226 lb (102.5 kg)   BMI 33.37 kg/m    Musculoskeletal: Strength & Muscle Tone: within normal limits Gait & Station: normal Patient leans: N/A  Mental Status Examination/Evaluation: Objective: Attitude: Calm and cooperative  Appearance: Fairly Groomed, appears to be stated age  Eye Contact::  Good  Speech:  Clear and Coherent and Normal Rate  Volume:  Normal  Mood:  depressed  Affect:  Congruent  Thought Process:  Coherent and Descriptions of Associations: Intact  Orientation:  Full (Time, Place, and Person)  Thought Content:  Logical  Suicidal Thoughts:  No  Homicidal Thoughts:  No  Judgement:  Fair  Insight:  Good  Concentration: good  Memory: Immediate-fair Recent-fair Remote- fair  Recall: fair  Language: fair  Gait and Station: normal  [employer] of Knowledge: average  Psychomotor Activity:  Normal  Akathisia:  No  Handed:  Right  AIMS (if indicated):  Facial and Oral Movements  Muscles of Facial Expression: None, normal  Lips and Perioral Area: None, normal  Jaw: None, normal  Tongue: None, normal Extremity Movements: Upper (arms, wrists, hands, fingers): None, normal  Lower (legs, knees, ankles, toes): None, normal,  Trunk Movements:  Neck, shoulders, hips: None, normal,  Overall Severity : Severity of abnormal movements (highest score from questions above): None, normal  Incapacitation due to abnormal movements: None, normal  Patient's awareness of abnormal movements (rate only patient's report): No Awareness, Dental Status  Current problems with teeth and/or dentures?: No  Does patient usually wear dentures?: No    Assets:  Communication Skills Desire for Improvement Housing      Reviewed A&P below on 05/27/17  and same as previous visits  except where noted  Assessment: AXIS I  Bipolar disorder- currently depressed- moderate and recurrent, GAD and ADD and possibly OCD   AXIS II  Deferred      Treatment Plan/Recommendations:  Plan of Care:  Medication management with supportive therapy. Risks/benefits and SE of the medication discussed. Pt verbalized understanding and verbal consent obtained for treatment.  Affirm with the patient that the medications are taken as ordered. Patient expressed understanding of how their medications were to be used.     Laboratory:  labs from PCP  Psychotherapy: Therapy: brief supportive therapy provided. Discussed psychosocial stressors in detail.  -discussed health stressors and coping mechanisms in detail  Medications:   Zoloft 200mg  po qD for depression and anxiety Serqouel 300mg  po qHS for Bipolar disorder Lamictal 100mg  po BID for Bipolar disorder Xanax 1mg  po TID prn anxiety Adderall 30mg  po BID for ADD    Routine PRN Medications: No   Consultations: recommend pt f/up  with her dermatologist and PCP  Safety Concerns: Pt denies SI and is at an acute low risk for suicide.Patient told to call clinic if any problems occur. Patient advised to go to ER if they should develop SI/HI, side effects, or if symptoms worsen. Has crisis numbers to call if needed. Pt verbalized understanding.   Other: F/up in 3 months or sooner if needed    Charlcie Cradle, MD 05/27/2017

## 2017-08-04 DIAGNOSIS — Z905 Acquired absence of kidney: Secondary | ICD-10-CM | POA: Diagnosis not present

## 2017-08-04 DIAGNOSIS — Q851 Tuberous sclerosis: Secondary | ICD-10-CM | POA: Diagnosis not present

## 2017-08-04 DIAGNOSIS — M79605 Pain in left leg: Secondary | ICD-10-CM | POA: Diagnosis not present

## 2017-08-04 DIAGNOSIS — I10 Essential (primary) hypertension: Secondary | ICD-10-CM | POA: Diagnosis not present

## 2017-08-04 DIAGNOSIS — K5900 Constipation, unspecified: Secondary | ICD-10-CM | POA: Diagnosis not present

## 2017-08-04 DIAGNOSIS — F319 Bipolar disorder, unspecified: Secondary | ICD-10-CM | POA: Diagnosis not present

## 2017-08-04 DIAGNOSIS — Z23 Encounter for immunization: Secondary | ICD-10-CM | POA: Diagnosis not present

## 2017-08-04 DIAGNOSIS — E669 Obesity, unspecified: Secondary | ICD-10-CM | POA: Diagnosis not present

## 2017-08-04 DIAGNOSIS — N182 Chronic kidney disease, stage 2 (mild): Secondary | ICD-10-CM | POA: Diagnosis not present

## 2017-08-04 DIAGNOSIS — D179 Benign lipomatous neoplasm, unspecified: Secondary | ICD-10-CM | POA: Diagnosis not present

## 2017-08-04 DIAGNOSIS — Z6834 Body mass index (BMI) 34.0-34.9, adult: Secondary | ICD-10-CM | POA: Diagnosis not present

## 2017-08-04 DIAGNOSIS — M79604 Pain in right leg: Secondary | ICD-10-CM | POA: Diagnosis not present

## 2017-08-06 DIAGNOSIS — I8312 Varicose veins of left lower extremity with inflammation: Secondary | ICD-10-CM | POA: Diagnosis not present

## 2017-08-06 DIAGNOSIS — I8311 Varicose veins of right lower extremity with inflammation: Secondary | ICD-10-CM | POA: Diagnosis not present

## 2017-08-16 ENCOUNTER — Other Ambulatory Visit (HOSPITAL_COMMUNITY): Payer: Self-pay | Admitting: Psychiatry

## 2017-08-16 DIAGNOSIS — F411 Generalized anxiety disorder: Secondary | ICD-10-CM

## 2017-08-16 DIAGNOSIS — F3130 Bipolar disorder, current episode depressed, mild or moderate severity, unspecified: Secondary | ICD-10-CM

## 2017-08-17 ENCOUNTER — Telehealth (HOSPITAL_COMMUNITY): Payer: Self-pay

## 2017-08-17 DIAGNOSIS — F988 Other specified behavioral and emotional disorders with onset usually occurring in childhood and adolescence: Secondary | ICD-10-CM

## 2017-08-17 MED ORDER — AMPHETAMINE-DEXTROAMPHETAMINE 30 MG PO TABS: 30.0000 mg | ORAL_TABLET | Freq: Two times a day (BID) | ORAL | 0 refills | Status: DC

## 2017-08-17 NOTE — Telephone Encounter (Signed)
Medication management - Telephone call with pt to inform her message with request for Adderall was received and that Dr. Lovena Le approved and signed a new one time prescription for this for her to pick up. Patient needed a new prescription as she last got 3 orders on 05/27/17 and was going to run out before appointment with Dr. Doyne Keel on 09/02/17.  Prescription left for patient to pick up.

## 2017-08-18 ENCOUNTER — Other Ambulatory Visit (HOSPITAL_COMMUNITY): Payer: Self-pay

## 2017-08-18 DIAGNOSIS — F3130 Bipolar disorder, current episode depressed, mild or moderate severity, unspecified: Secondary | ICD-10-CM

## 2017-08-18 MED ORDER — SERTRALINE HCL 100 MG PO TABS: 200.0000 mg | ORAL_TABLET | Freq: Every day | ORAL | 0 refills | Status: DC

## 2017-08-18 MED ORDER — LAMOTRIGINE 100 MG PO TABS: 100.0000 mg | ORAL_TABLET | Freq: Two times a day (BID) | ORAL | 0 refills | Status: DC

## 2017-08-18 MED ORDER — QUETIAPINE FUMARATE 300 MG PO TABS: 300.0000 mg | ORAL_TABLET | Freq: Every day | ORAL | 0 refills | Status: DC

## 2017-08-23 ENCOUNTER — Other Ambulatory Visit (HOSPITAL_COMMUNITY): Payer: Self-pay | Admitting: Psychiatry

## 2017-08-23 ENCOUNTER — Telehealth (HOSPITAL_COMMUNITY): Payer: Self-pay | Admitting: Psychiatry

## 2017-08-23 DIAGNOSIS — R300 Dysuria: Secondary | ICD-10-CM | POA: Diagnosis not present

## 2017-08-23 DIAGNOSIS — N390 Urinary tract infection, site not specified: Secondary | ICD-10-CM | POA: Diagnosis not present

## 2017-08-23 DIAGNOSIS — F3130 Bipolar disorder, current episode depressed, mild or moderate severity, unspecified: Secondary | ICD-10-CM

## 2017-08-23 NOTE — Telephone Encounter (Signed)
Patient's husband picked up Rx. ID verified.  DL #: [id number]

## 2017-08-26 ENCOUNTER — Other Ambulatory Visit (HOSPITAL_COMMUNITY): Payer: Self-pay | Admitting: Nephrology

## 2017-08-26 DIAGNOSIS — R319 Hematuria, unspecified: Secondary | ICD-10-CM

## 2017-08-26 NOTE — Telephone Encounter (Signed)
We can one 30 day supply. She has an appointment next week

## 2017-08-27 ENCOUNTER — Other Ambulatory Visit (HOSPITAL_COMMUNITY): Payer: Self-pay | Admitting: Psychiatry

## 2017-08-27 DIAGNOSIS — F3130 Bipolar disorder, current episode depressed, mild or moderate severity, unspecified: Secondary | ICD-10-CM

## 2017-09-01 DIAGNOSIS — M79605 Pain in left leg: Secondary | ICD-10-CM | POA: Diagnosis not present

## 2017-09-01 DIAGNOSIS — M79604 Pain in right leg: Secondary | ICD-10-CM | POA: Diagnosis not present

## 2017-09-02 ENCOUNTER — Other Ambulatory Visit: Payer: Self-pay

## 2017-09-02 ENCOUNTER — Ambulatory Visit (INDEPENDENT_AMBULATORY_CARE_PROVIDER_SITE_OTHER): Payer: No Typology Code available for payment source | Admitting: Psychiatry

## 2017-09-02 ENCOUNTER — Encounter (HOSPITAL_COMMUNITY): Payer: Self-pay | Admitting: Psychiatry

## 2017-09-02 DIAGNOSIS — Z79899 Other long term (current) drug therapy: Secondary | ICD-10-CM | POA: Diagnosis not present

## 2017-09-02 DIAGNOSIS — F3132 Bipolar disorder, current episode depressed, moderate: Secondary | ICD-10-CM

## 2017-09-02 DIAGNOSIS — F900 Attention-deficit hyperactivity disorder, predominantly inattentive type: Secondary | ICD-10-CM

## 2017-09-02 DIAGNOSIS — F988 Other specified behavioral and emotional disorders with onset usually occurring in childhood and adolescence: Secondary | ICD-10-CM

## 2017-09-02 DIAGNOSIS — F411 Generalized anxiety disorder: Secondary | ICD-10-CM

## 2017-09-02 DIAGNOSIS — Z818 Family history of other mental and behavioral disorders: Secondary | ICD-10-CM

## 2017-09-02 DIAGNOSIS — F3130 Bipolar disorder, current episode depressed, mild or moderate severity, unspecified: Secondary | ICD-10-CM

## 2017-09-02 MED ORDER — LAMOTRIGINE 100 MG PO TABS: 100.0000 mg | ORAL_TABLET | Freq: Two times a day (BID) | ORAL | 0 refills | Status: DC

## 2017-09-02 MED ORDER — QUETIAPINE FUMARATE 300 MG PO TABS: 300.0000 mg | ORAL_TABLET | Freq: Every day | ORAL | 0 refills | Status: DC

## 2017-09-02 MED ORDER — ALPRAZOLAM 1 MG PO TABS: 1.0000 mg | ORAL_TABLET | Freq: Four times a day (QID) | ORAL | 2 refills | Status: DC | PRN

## 2017-09-02 MED ORDER — SERTRALINE HCL 100 MG PO TABS: 200.0000 mg | ORAL_TABLET | Freq: Every day | ORAL | 0 refills | Status: DC

## 2017-09-02 MED ORDER — AMPHETAMINE-DEXTROAMPHETAMINE 30 MG PO TABS: 30.0000 mg | ORAL_TABLET | Freq: Two times a day (BID) | ORAL | 0 refills | Status: DC

## 2017-09-02 NOTE — Progress Notes (Signed)
[place] [facility] MD/PA/NP OP Progress Note  09/02/2017 4:01 PM Melissa Ochoa  MRN:  [id number]  Chief Complaint:  Chief Complaint    Follow-up; Anxiety; Depression     HPI: Here with daughter and granddaughter. Pt is having very irritable today. Pt had a panic attack today. She was able to talk herself out of it. Pt feels her whole life is rushed. Anxiety is getting worse especially at night. She has racing thoughts.  Depression is getting worse. She doesn't want to leave her house. She doesn't want to socialize. She hates the holidays. Christmas was her father's favorite holiday and it makes the holidays hard. Pt is not cleaning or cooking anymore. Sleep is ok for the most part. Pt denies SI/HI.  Pt denies manic and hypomanic symptoms including periods of decreased need for sleep, increased energy, mood lability, impulsivity, FOI, and excessive spending.  Pt states-taking meds as prescribed and denies SE.   Visit Diagnosis:    ICD-10-CM   1. GAD (generalized anxiety disorder) F41.1 ALPRAZolam (XANAX) 1 MG tablet  2. ADD (attention deficit disorder) without hyperactivity F98.8 amphetamine-dextroamphetamine (ADDERALL) 30 MG tablet    amphetamine-dextroamphetamine (ADDERALL) 30 MG tablet  3. Bipolar I disorder, most recent episode depressed (HCC) F31.30 lamoTRIgine (LAMICTAL) 100 MG tablet    QUEtiapine (SEROQUEL) 300 MG tablet    sertraline (ZOLOFT) 100 MG tablet       Past Psychiatric History:  Anxiety: Yes Bipolar Disorder: Yes Depression: Yes Mania: Yes Psychosis: No Schizophrenia: No Personality Disorder: No Hospitalization for psychiatric illness: No History of Electroconvulsive Shock Therapy: No Prior Suicide Attempts: No    Past Medical History:  Past Medical History:  Diagnosis Date  . ADHD (attention deficit hyperactivity disorder)   . Anxiety   . Bipolar disorder ([place])   . Cervical disc disease   . Colon polyps    adenomatous  . Depression   . Hypertension   .  Iron deficiency   . Kidney disease    renal angiomyolipomas  . Liver cyst   . Melanosis   . 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 COLONOSCOPY W/ ENDOSCOPIC [place]    . NEPHRECTOMY    . RENAL ARTERY EMBOLIZATION      Family Psychiatric History:  Family History  Problem Relation Age of Onset  . Lung cancer Father        smoker  . Anxiety disorder Mother   . ADD / ADHD Mother   . Depression Mother   . Diabetes Unknown        a lot of relatives  . ADD / ADHD Daughter   . Bipolar disorder Daughter   . Suicidality Neg Hx     Social History:  Social History   Socioeconomic History  . Marital status: Married    Spouse name: None  . Number of children: None  . Years of education: None  . Highest education level: None  Social Needs  . Financial resource strain: None  . Food insecurity - worry: None  . Food insecurity - inability: None  . Transportation needs - medical: None  . Transportation needs - non-medical: None  Occupational History  . None  Tobacco Use  . Smoking status: Never Smoker  . Smokeless tobacco: Never Used  Substance and Sexual Activity  . Alcohol use: No    Alcohol/week: 0.0 oz  . Drug use: No  . Sexual activity: None  Other Topics Concern  . None  Social History Narrative  . None    Allergies: No Known Allergies  Metabolic Disorder Labs: No results found for: HGBA1C, MPG No results found for: PROLACTIN No results found for: CHOL, TRIG, HDL, CHOLHDL, VLDL, LDLCALC No results found for: TSH  Therapeutic Level Labs: No results found for: LITHIUM No results found for: VALPROATE No components found for:  CBMZ  Current Medications: Current Outpatient Medications  Medication Sig Dispense Refill  . ALPRAZolam (XANAX) 1 MG tablet Take 1 tablet (1 mg total) by mouth 3 (three) times daily as needed for anxiety. 90 tablet 2  . amLODipine (NORVASC) 10 MG tablet Take 10 mg by mouth daily.    Marland Kitchen  amphetamine-dextroamphetamine (ADDERALL) 30 MG tablet Take 1 tablet by mouth 2 (two) times daily. 60 tablet 0  . amphetamine-dextroamphetamine (ADDERALL) 30 MG tablet Take 1 tablet by mouth 2 (two) times daily. 60 tablet 0  . amphetamine-dextroamphetamine (ADDERALL) 30 MG tablet Take 1 tablet by mouth 2 (two) times daily. 60 tablet 0  . everolimus (AFINITOR) 5 MG tablet Take 5 mg by mouth daily.    . hydrochlorothiazide (MICROZIDE) 12.5 MG capsule Take 12.5 mg by mouth daily.    Marland Kitchen labetalol (NORMODYNE) 200 MG tablet Take 200 mg by mouth 3 (three) times daily.    Marland Kitchen lamoTRIgine (LAMICTAL) 100 MG tablet Take 1 tablet (100 mg total) by mouth 2 (two) times daily. 60 tablet 0  . Linaclotide (LINZESS) 290 MCG CAPS capsule Take 1 capsule (290 mcg total) by mouth daily. 35 capsule 0  . losartan (COZAAR) 100 MG tablet Take 100 mg by mouth daily.    . norethindrone-ethinyl estradiol (JUNEL FE,GILDESS FE,LOESTRIN FE) 1-20 MG-MCG tablet Take 1 tablet by mouth daily.    . QUEtiapine (SEROQUEL) 300 MG tablet Take 1 tablet (300 mg total) by mouth at bedtime. 30 tablet 0  . sertraline (ZOLOFT) 100 MG tablet Take 2 tablets (200 mg total) by mouth daily. 60 tablet 0   No current facility-administered medications for this visit.      Musculoskeletal: Strength & Muscle Tone: within normal limits Gait & Station: normal Patient leans: N/A  Psychiatric Specialty Exam: Review of Systems  Constitutional: Negative for chills and fever.  HENT: Negative for congestion, ear pain, sinus pain and sore throat.   Neurological: Negative for weakness.    Blood pressure 125/83, pulse 91, temperature (!) 97.3 F (36.3 C), temperature source Oral, resp. rate 17, height 5\' 9"  (1.753 m), weight 222 lb 6.4 oz (100.9 kg), SpO2 96 %.Body mass index is 32.84 kg/m.  General Appearance: Fairly Groomed  Eye Contact:  Good  Speech:  Clear and Coherent and Normal Rate  Volume:  Normal  Mood:  Anxious, Depressed and Irritable   Affect:  Congruent  Thought Process:  Goal Directed and Descriptions of Associations: Intact  Orientation:  Full (Time, Place, and Person)  Thought Content: Logical   Suicidal Thoughts:  No  Homicidal Thoughts:  No  Memory:  Immediate;   Good Recent;   Good Remote;   Good  Judgement:  Good  Insight:  Good  Psychomotor Activity:  Normal  Concentration:  Concentration: Good and Attention Span: Good  Recall:  Good  Fund of Knowledge: Good  Language: Good  Akathisia:  No  Handed:  Right  AIMS (if indicated): not done  Assets:  Communication Skills Desire for Improvement Financial Resources/Insurance Housing Leisure Time Transportation  ADL's:  Intact  Cognition: WNL  Sleep:  Fair   Screenings: PHQ2-9  Office Visit from 04/17/2014 in [facility]  PHQ-2 Total Score  0       Assessment and Plan: Bipolar disorder- currently depressed, moderate and recurrent; GAD; ADHD-inattentive type; rule out OCD    Medication management with supportive therapy. Risks/benefits and SE of the medication discussed. Pt verbalized understanding and verbal consent obtained for treatment.  Affirm with the patient that the medications are taken as ordered. Patient expressed understanding of how their medications were to be used.     Meds:  Zoloft 200mg  po qD for depression and anxiety Serqouel 300mg  po qHS for Bipolar disorder Lamictal 100mg  po BID for Bipolar disorder Increase Xanax 1mg  po QID prn anxiety Adderall 30mg  po BID for ADD May consider Effexor XR in the future  Labs: none  Therapy: brief supportive therapy provided. Discussed psychosocial stressors in detail.     Consultations: Encouraged to follow up with PCP as needed  Pt denies SI and is at an acute low risk for suicide. Patient told to call clinic if any problems occur. Patient advised to go to ER if they should develop SI/HI, side effects, or if symptoms worsen. Has crisis numbers  to call if needed. Pt verbalized understanding.  F/up in 8 weeks or sooner if needed   Charlcie Cradle, MD 09/02/2017, 4:01 PM

## 2017-09-03 ENCOUNTER — Ambulatory Visit (HOSPITAL_COMMUNITY): Payer: Medicare Other | Attending: Nephrology

## 2017-09-03 ENCOUNTER — Encounter (HOSPITAL_COMMUNITY): Payer: Self-pay

## 2017-09-09 ENCOUNTER — Telehealth (HOSPITAL_COMMUNITY): Payer: Self-pay

## 2017-09-09 NOTE — Telephone Encounter (Signed)
Medication refill request - [facility] [address] for 90 day refill orders of patient's prescribed Seroquel and Lamictal. Pt. returns 11/04/16

## 2017-09-15 ENCOUNTER — Telehealth (HOSPITAL_COMMUNITY): Payer: Self-pay

## 2017-09-15 DIAGNOSIS — F3130 Bipolar disorder, current episode depressed, mild or moderate severity, unspecified: Secondary | ICD-10-CM

## 2017-09-15 NOTE — Telephone Encounter (Signed)
Medication refill request - Faxes received [facility] requesting a new 90 day order for her prescribed Quetiapine and Lamictal.  Patient returns 11/04/17.

## 2017-09-23 MED ORDER — QUETIAPINE FUMARATE 300 MG PO TABS: 300.0000 mg | ORAL_TABLET | Freq: Every day | ORAL | 0 refills | Status: DC

## 2017-09-23 MED ORDER — LAMOTRIGINE 100 MG PO TABS: 100.0000 mg | ORAL_TABLET | Freq: Two times a day (BID) | ORAL | 0 refills | Status: DC

## 2017-09-23 NOTE — Telephone Encounter (Signed)
yes

## 2017-09-23 NOTE — Telephone Encounter (Signed)
Yes that is fine

## 2017-09-23 NOTE — Telephone Encounter (Signed)
Dr. Doyne Keel approved a new 90 day order for patient's Seroquel and Lamictal.  Both e[facility] as authorized.

## 2017-10-18 ENCOUNTER — Telehealth (HOSPITAL_COMMUNITY): Payer: Self-pay

## 2017-10-19 ENCOUNTER — Other Ambulatory Visit (HOSPITAL_COMMUNITY): Payer: Self-pay

## 2017-10-19 DIAGNOSIS — F3130 Bipolar disorder, current episode depressed, mild or moderate severity, unspecified: Secondary | ICD-10-CM

## 2017-10-19 MED ORDER — SERTRALINE HCL 100 MG PO TABS: 200.0000 mg | ORAL_TABLET | Freq: Every day | ORAL | 0 refills | Status: DC

## 2017-10-26 ENCOUNTER — Telehealth: Payer: Self-pay | Admitting: Internal Medicine

## 2017-10-26 NOTE — Telephone Encounter (Signed)
Pt states she has been having black diarrhea stools for about 2 weeks. States she is going to the bathroom all the time, afraid she is going to get dehydrated. Pt scheduled to see Melissa Bogus PA tomorrow at 3pm. Pt aware of appt.

## 2017-10-27 ENCOUNTER — Other Ambulatory Visit (INDEPENDENT_AMBULATORY_CARE_PROVIDER_SITE_OTHER): Payer: Medicare Other

## 2017-10-27 ENCOUNTER — Encounter: Payer: Self-pay | Admitting: Gastroenterology

## 2017-10-27 ENCOUNTER — Ambulatory Visit: Payer: Medicare Other | Admitting: Gastroenterology

## 2017-10-27 VITALS — BP 126/90 | HR 92 | Ht 67.32 in | Wt 221.4 lb

## 2017-10-27 DIAGNOSIS — R197 Diarrhea, unspecified: Secondary | ICD-10-CM | POA: Diagnosis not present

## 2017-10-27 LAB — BASIC METABOLIC PANEL
BUN: 31 mg/dL — ABNORMAL HIGH (ref 6–23)
CHLORIDE: 102 meq/L (ref 96–112)
CO2: 27 mEq/L (ref 19–32)
Calcium: 9.6 mg/dL (ref 8.4–10.5)
Creatinine, Ser: 1.08 mg/dL (ref 0.40–1.20)
GFR: 56.36 mL/min — AB (ref >60.00)
Glucose, Bld: 91 mg/dL (ref 70–99)
POTASSIUM: 4.1 meq/L (ref 3.5–5.1)
SODIUM: 138 meq/L (ref 135–145)

## 2017-10-27 LAB — CBC WITH DIFFERENTIAL/PLATELET
BASOS PCT: 1.2 % (ref 0.0–3.0)
Basophils Absolute: 0.1 10*3/uL (ref 0.0–0.1)
EOS PCT: 28.1 % — AB (ref 0.0–5.0)
Eosinophils Absolute: 2 10*3/uL — ABNORMAL HIGH (ref 0.0–0.7)
HCT: 36.8 % (ref 36.0–46.0)
Hemoglobin: 12.1 g/dL (ref 12.0–15.0)
LYMPHS ABS: 1.5 10*3/uL (ref 0.7–4.0)
Lymphocytes Relative: 21.6 % (ref 12.0–46.0)
MCHC: 32.9 g/dL (ref 30.0–36.0)
MCV: 86.6 fl (ref 78.0–100.0)
MONOS PCT: 6.8 % (ref 3.0–12.0)
Monocytes Absolute: 0.5 10*3/uL (ref 0.1–1.0)
NEUTROS PCT: 42.3 % — AB (ref 43.0–77.0)
Neutro Abs: 2.9 10*3/uL (ref 1.4–7.7)
Platelets: 311 10*3/uL (ref 150.0–400.0)
RBC: 4.25 Mil/uL (ref 3.87–5.11)
RDW: 14.5 % (ref 11.5–15.5)
WBC: 6.9 10*3/uL (ref 4.0–10.5)

## 2017-10-27 LAB — TSH: TSH: 3.15 u[IU]/mL (ref 0.35–4.50)

## 2017-10-27 NOTE — Progress Notes (Signed)
10/27/2017 Melissa Ochoa [id number] 05/24/1966   HISTORY OF PRESENT ILLNESS: This is a 52 year old female who is known to Dr. Henrene Pastor.  She presents to our office today with complaints of diarrhea and black stools for the past 2 weeks.  She tells me that she had a sinus infection back in November.  Then in December she had a UTI that turned into a kidney infection for which she was treated with antibiotics.  Now for the past 2 weeks she has had severe diarrhea.  Says she has had no regular bowel movements in 2 weeks.  She is getting up at night several times with bowel movements.  She reports the stools as being dark black.  She has been using Pepto-Bismol but says that stools were black before that.  She says that prior to all of this back at the end of the year that her bowels are moving fine.  She denies any exact abdominal pain, but reports cramping in her lower abdomen at times.   Past Medical History:  Diagnosis Date  . ADHD (attention deficit hyperactivity disorder)   . Anxiety   . Bipolar disorder ([place])   . Cervical disc disease   . Colon polyps    adenomatous  . Depression   . Hypertension   . Iron deficiency   . Kidney disease    renal angiomyolipomas  . Liver cyst   . Melanosis   . 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 COLONOSCOPY W/ ENDOSCOPIC [place]    . NEPHRECTOMY    . RENAL ARTERY EMBOLIZATION      reports that  has never smoked. she has never used smokeless tobacco. She reports that she does not drink alcohol or use drugs. family history includes ADD / ADHD in her daughter and mother; Anxiety disorder in her mother; Bipolar disorder in her daughter; Depression in her mother; Diabetes in her unknown relative; Lung cancer in her father. No Known Allergies    Outpatient Encounter Medications as of 10/27/2017  Medication Sig  . ALPRAZolam (XANAX) 1 MG tablet Take 1 tablet (1 mg total) by mouth 4 (four) times daily as  needed for anxiety.  Marland Kitchen amLODipine (NORVASC) 10 MG tablet Take 10 mg by mouth daily.  Marland Kitchen amphetamine-dextroamphetamine (ADDERALL) 30 MG tablet Take 1 tablet by mouth 2 (two) times daily.  Marland Kitchen everolimus (AFINITOR) 5 MG tablet Take 5 mg by mouth daily.  . hydrochlorothiazide (MICROZIDE) 12.5 MG capsule Take 12.5 mg by mouth daily.  Marland Kitchen labetalol (NORMODYNE) 200 MG tablet Take 200 mg by mouth 3 (three) times daily.  Marland Kitchen lamoTRIgine (LAMICTAL) 100 MG tablet Take 1 tablet (100 mg total) by mouth 2 (two) times daily.  Marland Kitchen losartan (COZAAR) 100 MG tablet Take 100 mg by mouth daily.  . norethindrone-ethinyl estradiol (JUNEL FE,GILDESS FE,LOESTRIN FE) 1-20 MG-MCG tablet Take 1 tablet by mouth daily.  . QUEtiapine (SEROQUEL) 300 MG tablet Take 1 tablet (300 mg total) by mouth at bedtime.  . sertraline (ZOLOFT) 100 MG tablet Take 2 tablets (200 mg total) by mouth daily.  . [DISCONTINUED] amphetamine-dextroamphetamine (ADDERALL) 30 MG tablet Take 1 tablet by mouth 2 (two) times daily.  . [DISCONTINUED] amphetamine-dextroamphetamine (ADDERALL) 30 MG tablet Take 1 tablet by mouth 2 (two) times daily.  . [DISCONTINUED] Linaclotide (LINZESS) 290 MCG CAPS capsule Take 1 capsule (290 mcg total) by mouth daily. (Patient not taking: Reported on 09/02/2017)   No facility-administered encounter medications on  file as of 10/27/2017.      REVIEW OF SYSTEMS  : All other systems reviewed and negative except where noted in the History of Present Illness.   PHYSICAL EXAM: BP 126/90 (BP Location: Left Arm, Patient Position: Sitting, Cuff Size: Normal)   Pulse 92   Ht 5' 7.32" (1.71 m) Comment: height measured without shoes  Wt 221 lb 6 oz (100.4 kg)   BMI 34.34 kg/m  General: Well developed white female in no acute distress Head: Normocephalic and atraumatic Eyes:  Sclerae anicteric, conjunctiva pink. Ears: Normal auditory acuity Lungs: Clear throughout to auscultation; no increased WOB. Heart: Regular rate and rhythm; no  M/R/G. Abdomen: Soft, non-distended.  BS present.  Mild lower abdominal TTP. Rectal:  No external abnormalities noted.  DRE revealed soft stool in rectal vault.  Dark brown/greenish stool on exam glove that was hemoccult negative. Musculoskeletal: Symmetrical with no gross deformities  Skin: No lesions on visible extremities Extremities: No edema  Neurological: Alert oriented x 4, grossly non-focal Psychological:  Alert and cooperative. Normal mood and affect  ASSESSMENT AND PLAN: *52 year old female with 2 weeks of diarrhea:  Sudden onset.  Had been on some antibiotics at the end of last year.  Reports black stools, but dark green and hemoccult negative on exam today.  Will check labs including CBC and BMP.  Will check stool studies, GI pathogen panel and Cdiff PCR.  Drink plenty of fluids to stay hydrated.   CC:  Marton Redwood, MD

## 2017-10-27 NOTE — Patient Instructions (Signed)
Your physician has requested that you go to the basement for  lab work before leaving today.   

## 2017-10-28 NOTE — Progress Notes (Signed)
Assessment and plans reviewed  

## 2017-10-29 ENCOUNTER — Telehealth: Payer: Self-pay | Admitting: Gastroenterology

## 2017-10-29 NOTE — Telephone Encounter (Signed)
See result note.  

## 2017-11-01 ENCOUNTER — Other Ambulatory Visit: Payer: No Typology Code available for payment source

## 2017-11-01 DIAGNOSIS — R197 Diarrhea, unspecified: Secondary | ICD-10-CM

## 2017-11-02 LAB — CLOSTRIDIUM DIFFICILE BY PCR

## 2017-11-04 ENCOUNTER — Ambulatory Visit (HOSPITAL_COMMUNITY): Payer: Self-pay | Admitting: Psychiatry

## 2017-11-04 LAB — GASTROINTESTINAL PATHOGEN PANEL PCR
C. difficile Tox A/B, PCR: NOT DETECTED
Campylobacter, PCR: NOT DETECTED
Cryptosporidium, PCR: NOT DETECTED
E COLI (ETEC) LT/ST, PCR: NOT DETECTED
E COLI 0157, PCR: NOT DETECTED
E coli (STEC) stx1/stx2, PCR: NOT DETECTED
Giardia lamblia, PCR: NOT DETECTED
NOROVIRUS, PCR: NOT DETECTED
Rotavirus A, PCR: NOT DETECTED
SALMONELLA, PCR: NOT DETECTED
SHIGELLA, PCR: NOT DETECTED

## 2017-11-09 ENCOUNTER — Ambulatory Visit: Payer: Self-pay | Admitting: Physician Assistant

## 2017-11-10 ENCOUNTER — Telehealth (HOSPITAL_COMMUNITY): Payer: Self-pay

## 2017-11-10 NOTE — Telephone Encounter (Signed)
Patient has a follow up in April and needs a refill on Adderall. Please review and advise, thank you

## 2017-11-11 ENCOUNTER — Other Ambulatory Visit (HOSPITAL_COMMUNITY): Payer: Self-pay | Admitting: Psychiatry

## 2017-11-11 DIAGNOSIS — F988 Other specified behavioral and emotional disorders with onset usually occurring in childhood and adolescence: Secondary | ICD-10-CM

## 2017-11-11 MED ORDER — AMPHETAMINE-DEXTROAMPHETAMINE 30 MG PO TABS: 30.0000 mg | ORAL_TABLET | Freq: Two times a day (BID) | ORAL | 0 refills | Status: DC

## 2017-11-11 NOTE — Telephone Encounter (Signed)
Yes we can refill 

## 2017-11-11 NOTE — Telephone Encounter (Signed)
Do you want me to print it, or can you send from your computer?

## 2017-11-24 ENCOUNTER — Encounter: Payer: Self-pay | Admitting: Internal Medicine

## 2017-11-24 MED ORDER — AMPHETAMINE-DEXTROAMPHETAMINE 30 MG PO TABS: 30.0000 mg | ORAL_TABLET | Freq: Two times a day (BID) | ORAL | 0 refills | Status: DC

## 2017-11-24 NOTE — Telephone Encounter (Signed)
Medication Management - Telephone call with patient to follow up on complaint she has been trying to reach someone at our office because reports [facility] 30 mg Adderall and cannot transfer to another pharmacy so she needs a new Rx for 20 mg TID.  Informed patient this nurse would call her pharmacy to clarify the issue and would contact her back once discussed as patient agreed with plan. Telephone call then with Alyse Low, pharmacist at [facility] on [address] [facility] as she verified they were out of 30 mg Adderall, that they had given patient 3 days of the 15 mg on 11/20/17 but could not get in any more 30 mg at this time and could not transfer a C2 controlled prescription to another pharmacy.  Pharmacist reported they could fill a new order for 15 mg pills but that this would cost patient another $50 than for the 30 mg.  Pharmacist suggested having provider send a new Adderall 30 mg, once twice a day [facility] [address] as stated they still have 30 mg pills and that patient would be willing to go there for the order.  Will request a covering provider for Dr. Doyne Keel, out until tomorrow 11/25/17, send in a new prescription to [facility] and then will call the one back on [address] to discontinue.

## 2017-11-24 NOTE — Telephone Encounter (Signed)
All done, [facility] [address] today

## 2017-11-24 NOTE — Telephone Encounter (Signed)
Medication management - message left for patient that a new Adderall 30 mg, one twice a day order was [facility] [address] as they have this medication there and patient could pick up. Informed on message left if he had send in an order to [facility] [address] for 15 mg, 2 twice a day this would have cost her at least an additional $50 a month.  Requested patient call back if any questions or problems picking up new medication order.  [facility] [address] to cancel out order there from Dr. Doyne Keel since Dr. Daron Offer since in a new prescription to the [facility] on [address] per his approval and spoke with [place].

## 2017-11-29 DIAGNOSIS — M2550 Pain in unspecified joint: Secondary | ICD-10-CM | POA: Diagnosis not present

## 2017-11-29 DIAGNOSIS — M79643 Pain in unspecified hand: Secondary | ICD-10-CM | POA: Diagnosis not present

## 2017-12-12 ENCOUNTER — Other Ambulatory Visit (HOSPITAL_COMMUNITY): Payer: Self-pay | Admitting: Psychiatry

## 2017-12-12 DIAGNOSIS — F3130 Bipolar disorder, current episode depressed, mild or moderate severity, unspecified: Secondary | ICD-10-CM

## 2017-12-12 DIAGNOSIS — F411 Generalized anxiety disorder: Secondary | ICD-10-CM

## 2017-12-17 ENCOUNTER — Telehealth (HOSPITAL_COMMUNITY): Payer: Self-pay

## 2017-12-17 NOTE — Telephone Encounter (Signed)
Patient needs a refill on Adderall 30 mg - she has a follow up next month, please send to [place], thank you

## 2017-12-17 NOTE — Telephone Encounter (Signed)
She is an Interior and spatial designer patient. Have a nice weekend. See you Monday!

## 2017-12-21 MED ORDER — AMPHETAMINE-DEXTROAMPHETAMINE 30 MG PO TABS: 30.0000 mg | ORAL_TABLET | Freq: Two times a day (BID) | ORAL | 0 refills | Status: DC

## 2017-12-21 NOTE — Telephone Encounter (Signed)
Per Dr. Doyne Keel it is okay to fill, would you mind sending it in to [facility] [place].  Thank you.

## 2017-12-21 NOTE — Telephone Encounter (Signed)
Melissa Ochoa all set

## 2018-01-06 ENCOUNTER — Ambulatory Visit (INDEPENDENT_AMBULATORY_CARE_PROVIDER_SITE_OTHER): Payer: No Typology Code available for payment source | Admitting: Psychiatry

## 2018-01-06 ENCOUNTER — Encounter (HOSPITAL_COMMUNITY): Payer: Self-pay | Admitting: Psychiatry

## 2018-01-06 VITALS — BP 126/84 | HR 94 | Ht 67.25 in | Wt 221.0 lb

## 2018-01-06 DIAGNOSIS — F902 Attention-deficit hyperactivity disorder, combined type: Secondary | ICD-10-CM | POA: Diagnosis not present

## 2018-01-06 DIAGNOSIS — F900 Attention-deficit hyperactivity disorder, predominantly inattentive type: Secondary | ICD-10-CM | POA: Diagnosis not present

## 2018-01-06 DIAGNOSIS — F3130 Bipolar disorder, current episode depressed, mild or moderate severity, unspecified: Secondary | ICD-10-CM

## 2018-01-06 DIAGNOSIS — Z818 Family history of other mental and behavioral disorders: Secondary | ICD-10-CM | POA: Diagnosis not present

## 2018-01-06 DIAGNOSIS — M2550 Pain in unspecified joint: Secondary | ICD-10-CM

## 2018-01-06 DIAGNOSIS — M542 Cervicalgia: Secondary | ICD-10-CM | POA: Diagnosis not present

## 2018-01-06 DIAGNOSIS — F411 Generalized anxiety disorder: Secondary | ICD-10-CM | POA: Diagnosis not present

## 2018-01-06 MED ORDER — ALPRAZOLAM 1 MG PO TABS: ORAL_TABLET | ORAL | 0 refills | Status: DC

## 2018-01-06 MED ORDER — AMPHETAMINE-DEXTROAMPHETAMINE 30 MG PO TABS: 30.0000 mg | ORAL_TABLET | Freq: Two times a day (BID) | ORAL | 0 refills | Status: DC

## 2018-01-06 MED ORDER — LAMOTRIGINE 100 MG PO TABS: 100.0000 mg | ORAL_TABLET | Freq: Two times a day (BID) | ORAL | 0 refills | Status: DC

## 2018-01-06 MED ORDER — QUETIAPINE FUMARATE 300 MG PO TABS: 300.0000 mg | ORAL_TABLET | Freq: Every day | ORAL | 0 refills | Status: DC

## 2018-01-06 MED ORDER — SERTRALINE HCL 100 MG PO TABS: 200.0000 mg | ORAL_TABLET | Freq: Every day | ORAL | 0 refills | Status: DC

## 2018-01-06 NOTE — Progress Notes (Signed)
[facility] MD/PA/NP OP Progress Note  01/06/2018 4:48 PM Melissa Ochoa  MRN:  [id number]  Chief Complaint:  Chief Complaint    Follow-up     HPI: pt states she is frustrated by her ongoing health challenges. She now has swollen joints.  She states that last week was very difficult for her.  For a period of a few days she felt very down and could not describe it well.  She believes it was related to having to deal with the new norm.  Patient admits that she had a lot of self pity going on at the time.  She does find it overwhelming to deal with every new medical problem that comes up.  She states her depression is overall unchanged.  She denies any SI/HI.  She finds great comfort and joy in being with her granddaughter.  Her anxiety continues but is manageable.  ADHD is decently controlled with her Adderall.  Patient is taking medication as prescribed and denies side effects.  She does not want her meds changed today  Visit Diagnosis:    ICD-10-CM   1. Attention deficit hyperactivity disorder (ADHD), predominantly inattentive type F90.0 amphetamine-dextroamphetamine (ADDERALL) 30 MG tablet    amphetamine-dextroamphetamine (ADDERALL) 30 MG tablet    amphetamine-dextroamphetamine (ADDERALL) 30 MG tablet  2. GAD (generalized anxiety disorder) F41.1 ALPRAZolam (XANAX) 1 MG tablet  3. Bipolar I disorder, most recent episode depressed (HCC) F31.30 lamoTRIgine (LAMICTAL) 100 MG tablet    QUEtiapine (SEROQUEL) 300 MG tablet    sertraline (ZOLOFT) 100 MG tablet  4. Attention deficit hyperactivity disorder (ADHD), combined type F90.2     Past Psychiatric History:  Anxiety: Yes Bipolar Disorder: Yes Depression: Yes Mania: Yes Psychosis: No Schizophrenia: No Personality Disorder: No Hospitalization for psychiatric illness: No History of Electroconvulsive Shock Therapy: No Prior Suicide Attempts: No   Past Medical History:  Past Medical History:  Diagnosis Date  . ADHD (attention deficit  hyperactivity disorder)   . Anxiety   . Bipolar disorder ([place])   . Cervical disc disease   . Colon polyps    adenomatous  . Depression   . Hypertension   . Iron deficiency   . Kidney disease    renal angiomyolipomas  . Liver cyst   . Melanosis   . 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 COLONOSCOPY W/ ENDOSCOPIC [place]    . NEPHRECTOMY    . RENAL ARTERY EMBOLIZATION      Family Psychiatric History: Family History  Problem Relation Age of Onset  . Lung cancer Father        smoker  . Anxiety disorder Mother   . ADD / ADHD Mother   . Depression Mother   . Diabetes Unknown        a lot of relatives  . ADD / ADHD Daughter   . Bipolar disorder Daughter   . Suicidality Neg Hx     Social History:  Social History   Socioeconomic History  . Marital status: Married    Spouse name: Not on file  . Number of children: Not on file  . Years of education: Not on file  . Highest education level: Not on file  Occupational History  . Not on file  Social Needs  . Financial resource strain: Not on file  . Food insecurity:    Worry: Not on file    Inability: Not on file  . Transportation needs:    Medical: Not  on file    Non-medical: Not on file  Tobacco Use  . Smoking status: Never Smoker  . Smokeless tobacco: Never Used  Substance and Sexual Activity  . Alcohol use: No    Alcohol/week: 0.0 oz  . Drug use: No  . Sexual activity: Yes    Partners: Male    Birth control/protection: None  Lifestyle  . Physical activity:    Days per week: Not on file    Minutes per session: Not on file  . Stress: Not on file  Relationships  . Social connections:    Talks on phone: Not on file    Gets together: Not on file    Attends religious service: Not on file    Active member of club or organization: Not on file    Attends meetings of clubs or organizations: Not on file    Relationship status: Not on file  Other Topics Concern  . Not on  file  Social History Narrative  . Not on file    Allergies: No Known Allergies  Metabolic Disorder Labs: No results found for: HGBA1C, MPG No results found for: PROLACTIN No results found for: CHOL, TRIG, HDL, CHOLHDL, VLDL, LDLCALC Lab Results  Component Value Date   TSH 3.15 10/27/2017    Therapeutic Level Labs: No results found for: LITHIUM No results found for: VALPROATE No components found for:  CBMZ  Current Medications: Current Outpatient Medications  Medication Sig Dispense Refill  . ALPRAZolam (XANAX) 1 MG tablet TAKE 1 TABLET BY MOUTH FOUR TIMES A DAY AS NEEDED FOR ANXIETY 120 tablet 0  . amLODipine (NORVASC) 10 MG tablet Take 10 mg by mouth daily.    Marland Kitchen amphetamine-dextroamphetamine (ADDERALL) 30 MG tablet Take 1 tablet by mouth 2 (two) times daily. 60 tablet 0  . labetalol (NORMODYNE) 200 MG tablet Take 200 mg by mouth 3 (three) times daily.    Marland Kitchen lamoTRIgine (LAMICTAL) 100 MG tablet TAKE 1 TABLET BY MOUTH TWICE A DAY 180 tablet 0  . losartan (COZAAR) 100 MG tablet Take 100 mg by mouth daily.    . QUEtiapine (SEROQUEL) 300 MG tablet Take 1 tablet (300 mg total) by mouth at bedtime. 90 tablet 0   No current facility-administered medications for this visit.      Musculoskeletal: Strength & Muscle Tone: within normal limits Gait & Station: normal Patient leans: N/A  Psychiatric Specialty Exam: Review of Systems  Constitutional: Negative for chills, diaphoresis and fever.  Musculoskeletal: Positive for joint pain and neck pain. Negative for back pain.    Blood pressure 126/84, pulse 94, height 5' 7.25" (1.708 m), weight 221 lb (100.2 kg), SpO2 95 %.Body mass index is 34.36 kg/m.  General Appearance: Fairly Groomed  Eye Contact:  Good  Speech:  Clear and Coherent and Normal Rate  Volume:  Normal  Mood:  Anxious and Depressed  Affect:  Congruent  Thought Process:  Goal Directed and Descriptions of Associations: Intact  Orientation:  Full (Time, Place, and  Person)  Thought Content: Logical   Suicidal Thoughts:  No  Homicidal Thoughts:  No  Memory:  Immediate;   Good Recent;   Good Remote;   Good  Judgement:  Good  Insight:  Good  Psychomotor Activity:  Normal  Concentration:  Concentration: Fair and Attention Span: Fair  Recall:  [place] of Knowledge: Good  Language: Good  Akathisia:  No  Handed:  Right  AIMS (if indicated): not done  Assets:  Communication Skills Desire for Improvement  Housing  ADL's:  Intact  Cognition: WNL  Sleep:  Poor   Screenings: PHQ2-9     [facility] Visit from 04/17/2014 in [facility] for Infectious Disease  PHQ-2 Total Score  0       Assessment and Plan: Bipolar disorder-currently depressed, moderate recurrent; GAD; ADHD-inattentive type; rule out OCD    Medication management with supportive therapy. Risks and benefits, side effects and alternative treatment options discussed with patient. Pt was given an opportunity to ask questions about medication, illness, and treatment. All current psychiatric medications have been reviewed and discussed with the patient and adjusted as clinically appropriate. The patient has been provided an accurate and updated list of the medications being now prescribed. Patient expressed understanding of how their medications were to be used.  Pt verbalized understanding and verbal consent obtained for treatment.   Status of current problems: ongoing  Meds: Zoloft 200 mg p.o. daily for depression and GAD Seroquel 300 mg p.o. nightly for bipolar disorder Lamictal 100 mg p.o. twice daily for bipolar disorder Adderall 30 milligrams p.o. twice daily for ADHD Xanax 1 mg p.o. 4 times daily as needed anxiety May consider Effexor XR in the future Pt does not want meds changed today  Labs: none  Therapy: brief supportive therapy provided. Discussed psychosocial stressors in detail.    Consultations: Encouraged to follow up with PCP as needed  Pt denies SI  and is at an acute low risk for suicide. Patient told to call clinic if any problems occur. Patient advised to go to ER if they should develop SI/HI, side effects, or if symptoms worsen. Has crisis numbers to call if needed. Pt verbalized understanding.  F/up in 2 months or sooner if needed    Charlcie Cradle, MD 01/06/2018, 4:48 PM

## 2018-01-07 DIAGNOSIS — I788 Other diseases of capillaries: Secondary | ICD-10-CM | POA: Diagnosis not present

## 2018-01-12 ENCOUNTER — Other Ambulatory Visit (HOSPITAL_COMMUNITY): Payer: Self-pay | Admitting: Psychiatry

## 2018-01-12 DIAGNOSIS — F3130 Bipolar disorder, current episode depressed, mild or moderate severity, unspecified: Secondary | ICD-10-CM

## 2018-01-24 DIAGNOSIS — I788 Other diseases of capillaries: Secondary | ICD-10-CM | POA: Diagnosis not present

## 2018-01-25 DIAGNOSIS — N182 Chronic kidney disease, stage 2 (mild): Secondary | ICD-10-CM | POA: Diagnosis not present

## 2018-01-25 DIAGNOSIS — D631 Anemia in chronic kidney disease: Secondary | ICD-10-CM | POA: Diagnosis not present

## 2018-01-26 DIAGNOSIS — M7989 Other specified soft tissue disorders: Secondary | ICD-10-CM | POA: Diagnosis not present

## 2018-01-26 DIAGNOSIS — Z6832 Body mass index (BMI) 32.0-32.9, adult: Secondary | ICD-10-CM | POA: Diagnosis not present

## 2018-01-26 DIAGNOSIS — E669 Obesity, unspecified: Secondary | ICD-10-CM | POA: Diagnosis not present

## 2018-01-26 DIAGNOSIS — M2550 Pain in unspecified joint: Secondary | ICD-10-CM | POA: Diagnosis not present

## 2018-01-26 DIAGNOSIS — R5382 Chronic fatigue, unspecified: Secondary | ICD-10-CM | POA: Diagnosis not present

## 2018-02-10 ENCOUNTER — Telehealth (HOSPITAL_COMMUNITY): Payer: Self-pay

## 2018-02-10 NOTE — Telephone Encounter (Signed)
Medication management - prior authorization completed online for continued Adderall 30 mg, one twice a day, #60 under [employer] [place].  Pending decision.

## 2018-02-12 ENCOUNTER — Other Ambulatory Visit (HOSPITAL_COMMUNITY): Payer: Self-pay | Admitting: Psychiatry

## 2018-02-12 DIAGNOSIS — F411 Generalized anxiety disorder: Secondary | ICD-10-CM

## 2018-02-15 ENCOUNTER — Other Ambulatory Visit (HOSPITAL_COMMUNITY): Payer: Self-pay | Admitting: Psychiatry

## 2018-02-15 DIAGNOSIS — F411 Generalized anxiety disorder: Secondary | ICD-10-CM

## 2018-02-17 ENCOUNTER — Other Ambulatory Visit (HOSPITAL_COMMUNITY): Payer: Self-pay

## 2018-02-17 DIAGNOSIS — F411 Generalized anxiety disorder: Secondary | ICD-10-CM

## 2018-02-17 MED ORDER — ALPRAZOLAM 1 MG PO TABS: ORAL_TABLET | ORAL | 0 refills | Status: DC

## 2018-02-28 DIAGNOSIS — I129 Hypertensive chronic kidney disease with stage 1 through stage 4 chronic kidney disease, or unspecified chronic kidney disease: Secondary | ICD-10-CM | POA: Diagnosis not present

## 2018-02-28 DIAGNOSIS — N182 Chronic kidney disease, stage 2 (mild): Secondary | ICD-10-CM | POA: Diagnosis not present

## 2018-02-28 DIAGNOSIS — Z905 Acquired absence of kidney: Secondary | ICD-10-CM | POA: Diagnosis not present

## 2018-02-28 DIAGNOSIS — D179 Benign lipomatous neoplasm, unspecified: Secondary | ICD-10-CM | POA: Diagnosis not present

## 2018-03-01 DIAGNOSIS — I788 Other diseases of capillaries: Secondary | ICD-10-CM | POA: Diagnosis not present

## 2018-03-16 ENCOUNTER — Other Ambulatory Visit (HOSPITAL_COMMUNITY): Payer: Self-pay | Admitting: Psychiatry

## 2018-03-16 ENCOUNTER — Other Ambulatory Visit (HOSPITAL_COMMUNITY): Payer: Self-pay

## 2018-03-16 DIAGNOSIS — F411 Generalized anxiety disorder: Secondary | ICD-10-CM

## 2018-03-16 MED ORDER — ALPRAZOLAM 1 MG PO TABS: ORAL_TABLET | ORAL | 0 refills | Status: DC

## 2018-03-21 DIAGNOSIS — I788 Other diseases of capillaries: Secondary | ICD-10-CM | POA: Diagnosis not present

## 2018-04-01 ENCOUNTER — Other Ambulatory Visit (HOSPITAL_COMMUNITY): Payer: Self-pay | Admitting: Psychiatry

## 2018-04-01 DIAGNOSIS — F3130 Bipolar disorder, current episode depressed, mild or moderate severity, unspecified: Secondary | ICD-10-CM

## 2018-04-07 ENCOUNTER — Encounter (HOSPITAL_COMMUNITY): Payer: Self-pay | Admitting: Psychiatry

## 2018-04-07 ENCOUNTER — Ambulatory Visit (INDEPENDENT_AMBULATORY_CARE_PROVIDER_SITE_OTHER): Payer: No Typology Code available for payment source | Admitting: Psychiatry

## 2018-04-07 VITALS — BP 146/92 | HR 60 | Ht 67.25 in | Wt 214.0 lb

## 2018-04-07 DIAGNOSIS — F900 Attention-deficit hyperactivity disorder, predominantly inattentive type: Secondary | ICD-10-CM | POA: Diagnosis not present

## 2018-04-07 DIAGNOSIS — F3130 Bipolar disorder, current episode depressed, mild or moderate severity, unspecified: Secondary | ICD-10-CM | POA: Diagnosis not present

## 2018-04-07 DIAGNOSIS — F411 Generalized anxiety disorder: Secondary | ICD-10-CM | POA: Diagnosis not present

## 2018-04-07 MED ORDER — AMPHETAMINE-DEXTROAMPHETAMINE 30 MG PO TABS: 30.0000 mg | ORAL_TABLET | Freq: Two times a day (BID) | ORAL | 0 refills | Status: DC

## 2018-04-07 MED ORDER — ALPRAZOLAM 1 MG PO TABS: ORAL_TABLET | ORAL | 0 refills | Status: DC

## 2018-04-07 MED ORDER — LAMOTRIGINE 100 MG PO TABS: 100.0000 mg | ORAL_TABLET | Freq: Two times a day (BID) | ORAL | 0 refills | Status: DC

## 2018-04-07 MED ORDER — SERTRALINE HCL 100 MG PO TABS: 200.0000 mg | ORAL_TABLET | Freq: Every day | ORAL | 0 refills | Status: DC

## 2018-04-07 MED ORDER — QUETIAPINE FUMARATE 300 MG PO TABS: 300.0000 mg | ORAL_TABLET | Freq: Every day | ORAL | 0 refills | Status: DC

## 2018-04-07 NOTE — Progress Notes (Signed)
[facility] MD/PA/NP OP Progress Note  04/07/2018 3:53 PM SHARMA LAWRANCE  MRN:  [id number]  Chief Complaint:  Chief Complaint    Follow-up     HPI: Here with her granddaughter. Pt states "I just have been so tired lately". Pt is having a lot of pain lately. She ran out of Seroquel 4 days early and doesn't understand why.  Sleep is not great and she does toss and turn due to pain. Pt is having trouble with [facility] filling her meds and insurance. Her stress tolerance lately is low and she is feeling irritable. She thinks she will be ok once she restarts Seroquel. [place]'s anxiety comes on when stressed. Pt denies SI/HI. [place] is feeling very distracted today. She has a number of concerns about her family moving into her home.  Her depression is unchanged. She states she was manic about 10 days ago for 4 days. During that time she was irritable, restless with more energy than usual and not sleeping much. She denies any impulsive behaviors during that time. Pt states-taking meds as prescribed and denies SE.   Visit Diagnosis:    ICD-10-CM   1. Bipolar I disorder, most recent episode depressed ([place]) F31.30   2. GAD (generalized anxiety disorder) F41.1   3. Attention deficit hyperactivity disorder (ADHD), predominantly inattentive type F90.0      Past Psychiatric History:  Anxiety:Yes Bipolar Disorder:Yes Depression:Yes Mania:Yes Psychosis:No Schizophrenia:No Personality Disorder:No Hospitalization for psychiatric illness:No History of Electroconvulsive Shock Therapy:No Prior Suicide Attempts:No    Past Medical History:  Past Medical History:  Diagnosis Date  . ADHD (attention deficit hyperactivity disorder)   . Anxiety   . Bipolar disorder ([place])   . Cervical disc disease   . Colon polyps    adenomatous  . Depression   . Hypertension   . Iron deficiency   . Kidney disease    renal angiomyolipomas  . Liver cyst   . Melanosis   . 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 COLONOSCOPY W/ ENDOSCOPIC [place]    . NEPHRECTOMY    . RENAL ARTERY EMBOLIZATION      Family Psychiatric History:  Family History  Problem Relation Age of Onset  . Lung cancer Father        smoker  . Anxiety disorder Mother   . ADD / ADHD Mother   . Depression Mother   . Diabetes Unknown        a lot of relatives  . ADD / ADHD Daughter   . Bipolar disorder Daughter   . Suicidality Neg Hx     Social History:  Social History   Socioeconomic History  . Marital status: Married    Spouse name: Not on file  . Number of children: Not on file  . Years of education: Not on file  . Highest education level: Not on file  Occupational History  . Not on file  Social Needs  . Financial resource strain: Not on file  . Food insecurity:    Worry: Not on file    Inability: Not on file  . Transportation needs:    Medical: Not on file    Non-medical: Not on file  Tobacco Use  . Smoking status: Never Smoker  . Smokeless tobacco: Never Used  Substance and Sexual Activity  . Alcohol use: No    Alcohol/week: 0.0 oz  . Drug use: No  . Sexual activity: Yes    Partners: Male  Birth control/protection: None  Lifestyle  . Physical activity:    Days per week: Not on file    Minutes per session: Not on file  . Stress: Not on file  Relationships  . Social connections:    Talks on phone: Not on file    Gets together: Not on file    Attends religious service: Not on file    Active member of club or organization: Not on file    Attends meetings of clubs or organizations: Not on file    Relationship status: Not on file  Other Topics Concern  . Not on file  Social History Narrative  . Not on file    Allergies: No Known Allergies  Metabolic Disorder Labs: No results found for: HGBA1C, MPG No results found for: PROLACTIN No results found for: CHOL, TRIG, HDL, CHOLHDL, VLDL, LDLCALC Lab Results  Component Value Date   TSH 3.15  10/27/2017    Therapeutic Level Labs: No results found for: LITHIUM No results found for: VALPROATE No components found for:  CBMZ  Current Medications: Current Outpatient Medications  Medication Sig Dispense Refill  . ALPRAZolam (XANAX) 1 MG tablet TAKE 1 TABLET BY MOUTH FOUR TIMES A DAY AS NEEDED FOR ANXIETY 120 tablet 0  . amLODipine (NORVASC) 10 MG tablet Take 10 mg by mouth daily.    Marland Kitchen amphetamine-dextroamphetamine (ADDERALL) 30 MG tablet Take 1 tablet by mouth 2 (two) times daily. 60 tablet 0  . amphetamine-dextroamphetamine (ADDERALL) 30 MG tablet Take 1 tablet by mouth 2 (two) times daily. 60 tablet 0  . amphetamine-dextroamphetamine (ADDERALL) 30 MG tablet Take 1 tablet by mouth 2 (two) times daily. 60 tablet 0  . labetalol (NORMODYNE) 200 MG tablet Take 200 mg by mouth 3 (three) times daily.    Marland Kitchen lamoTRIgine (LAMICTAL) 100 MG tablet Take 1 tablet (100 mg total) by mouth 2 (two) times daily. 180 tablet 0  . losartan (COZAAR) 100 MG tablet Take 100 mg by mouth daily.    . QUEtiapine (SEROQUEL) 300 MG tablet Take 1 tablet (300 mg total) by mouth at bedtime. 90 tablet 0  . sertraline (ZOLOFT) 100 MG tablet Take 2 tablets (200 mg total) by mouth daily. 180 tablet 0   No current facility-administered medications for this visit.      Musculoskeletal: Strength & Muscle Tone: within normal limits Gait & Station: normal Patient leans: N/A  Psychiatric Specialty Exam: Review of Systems  Constitutional: Negative for chills, diaphoresis and fever.  Musculoskeletal: Positive for back pain, joint pain, myalgias and neck pain.    Blood pressure (!) 146/92, pulse 60, height 5' 7.25" (1.708 m), weight 214 lb (97.1 kg), SpO2 99 %.Body mass index is 33.27 kg/m.  Pt is very distracted and difficult to engage in the beginning of the session but improved to her usual presentation after several minutes  General Appearance: Fairly Groomed  Eye Contact:  Fair  Speech:  Clear and Coherent and  Normal Rate  Volume:  Normal  Mood:  Irritable  Affect:  Full Range  Thought Process:  Goal Directed and Descriptions of Associations: Intact  Orientation:  Full (Time, Place, and Person)  Thought Content: Logical   Suicidal Thoughts:  No  Homicidal Thoughts:  No  Memory:  Immediate;   Fair Recent;   Fair Remote;   Fair  Judgement:  Intact  Insight:  Present  Psychomotor Activity:  Normal  Concentration:  Concentration: Fair and Attention Span: Fair  Recall:  [employer] of Knowledge:  Good  Language: Good  Akathisia:  No  Handed:  Right  AIMS (if indicated): not done  Assets:  Communication Skills Desire for Improvement Housing Social Support Transportation  ADL's:  Intact  Cognition: WNL  Sleep:  Poor   Screenings: PHQ2-9     [facility] Visit [facility] 04/17/2014 in [facility] for [facility]ious Disease  PHQ-2 Total Score  0      I reviewed the information below on 04/07/2018 and agree except where noted/changed Assessment and Plan: Bipolar disorder; GAD; ADHD-inattentive type; rule out OCD    Medication management with supportive therapy. Risks and benefits, side effects and alternative treatment options discussed with patient. Pt was given an opportunity to ask questions about medication, illness, and treatment. All current psychiatric medications have been reviewed and discussed with the patient and adjusted as clinically appropriate. The patient has been provided an accurate and updated list of the medications being now prescribed. Patient expressed understanding of how their medications were to be used.  Pt verbalized understanding and verbal consent obtained for treatment.   Status of current problems: ongoing  Meds: Zoloft 200 mg p.o. daily for depression and GAD Seroquel 300 mg p.o. nightly for bipolar disorder Lamictal 100 mg p.o. twice daily for bipolar disorder Adderall 30 mg p.o. twice daily for ADHD Xanax 1 mg p.o. 4 times daily as needed  anxiety May consider Effexor XR in the future   Labs: none  Therapy: brief supportive therapy provided. Discussed psychosocial stressors in detail.    Consultations: Encouraged to follow up with PCP as needed  Pt denies SI and is at an acute low risk for suicide. Patient told to call clinic if any problems occur. Patient advised to go to ER if they should develop SI/HI, side effects, or if symptoms worsen. Has crisis numbers to call if needed. Pt verbalized understanding.  F/up in 2 months or sooner if needed     Charlcie Cradle, MD 04/07/2018, 3:53 PM

## 2018-05-11 ENCOUNTER — Other Ambulatory Visit (HOSPITAL_COMMUNITY): Payer: Self-pay | Admitting: Psychiatry

## 2018-05-11 DIAGNOSIS — F411 Generalized anxiety disorder: Secondary | ICD-10-CM

## 2018-06-09 ENCOUNTER — Encounter (HOSPITAL_COMMUNITY): Payer: Self-pay | Admitting: Psychiatry

## 2018-06-09 ENCOUNTER — Ambulatory Visit (HOSPITAL_COMMUNITY): Payer: No Typology Code available for payment source | Admitting: Psychiatry

## 2018-06-09 VITALS — BP 126/74 | HR 74 | Ht 69.0 in | Wt 219.0 lb

## 2018-06-09 DIAGNOSIS — F900 Attention-deficit hyperactivity disorder, predominantly inattentive type: Secondary | ICD-10-CM

## 2018-06-09 DIAGNOSIS — Z818 Family history of other mental and behavioral disorders: Secondary | ICD-10-CM | POA: Diagnosis not present

## 2018-06-09 DIAGNOSIS — F411 Generalized anxiety disorder: Secondary | ICD-10-CM | POA: Diagnosis not present

## 2018-06-09 DIAGNOSIS — F3130 Bipolar disorder, current episode depressed, mild or moderate severity, unspecified: Secondary | ICD-10-CM | POA: Diagnosis not present

## 2018-06-09 MED ORDER — LAMOTRIGINE 100 MG PO TABS: 100.0000 mg | ORAL_TABLET | Freq: Two times a day (BID) | ORAL | 0 refills | Status: DC

## 2018-06-09 MED ORDER — AMPHETAMINE-DEXTROAMPHETAMINE 30 MG PO TABS: 30.0000 mg | ORAL_TABLET | Freq: Two times a day (BID) | ORAL | 0 refills | Status: DC

## 2018-06-09 MED ORDER — QUETIAPINE FUMARATE 300 MG PO TABS: 300.0000 mg | ORAL_TABLET | Freq: Every day | ORAL | 0 refills | Status: DC

## 2018-06-09 MED ORDER — ALPRAZOLAM 1 MG PO TABS: ORAL_TABLET | ORAL | 0 refills | Status: DC

## 2018-06-09 MED ORDER — SERTRALINE HCL 100 MG PO TABS: 200.0000 mg | ORAL_TABLET | Freq: Every day | ORAL | 0 refills | Status: DC

## 2018-06-09 NOTE — Progress Notes (Signed)
[facility] MD/PA/NP OP Progress Note  06/09/2018 4:50 PM JENNILYN ESTEVE  MRN:  [id number]  Chief Complaint:  Chief Complaint    Follow-up     HPI: Patient arrived 10 minutes late for her 15-minute appointment.  Patient is here with her granddaughter today.  She states that somehow she forgot to refill her Zoloft and was off of it for at least a month may be longer.  Her family noticed that she was acting more irritable and depressed.  Patient then realized that she was not taking her sertraline and restarted it about 4 days ago.  She states that she has been crying a lot, feeling sad and as though "everyone is leaving me".  Her youngest daughter is moving out.  Her mother is getting older and will not be around forever.  Her father is already passed.  She states that of her childhood there will be no one left.  She denies SI/HI.  She continues to have pain and does not understand why she suffers from this illness while no one else in the family is afflicted.  Her sleep is good with a new pillow.  She continues to have ongoing anxiety.  She denies symptoms of mania and hypomania.   Visit Diagnosis:    ICD-10-CM   1. Bipolar I disorder, most recent episode depressed (HCC) F31.30 lamoTRIgine (LAMICTAL) 100 MG tablet    QUEtiapine (SEROQUEL) 300 MG tablet    sertraline (ZOLOFT) 100 MG tablet  2. GAD (generalized anxiety disorder) F41.1 ALPRAZolam (XANAX) 1 MG tablet  3. Attention deficit hyperactivity disorder (ADHD), predominantly inattentive type F90.0 amphetamine-dextroamphetamine (ADDERALL) 30 MG tablet    amphetamine-dextroamphetamine (ADDERALL) 30 MG tablet    amphetamine-dextroamphetamine (ADDERALL) 30 MG tablet     Past Psychiatric History:  Anxiety:Yes Bipolar Disorder:Yes Depression:Yes Mania:Yes Psychosis:No Schizophrenia:No Personality Disorder:No Hospitalization for psychiatric illness:No History of Electroconvulsive Shock Therapy:No Prior Suicide  Attempts:No    Past Medical History:  Past Medical History:  Diagnosis Date  . ADHD (attention deficit hyperactivity disorder)   . Anxiety   . Bipolar disorder ([place])   . Cervical disc disease   . Colon polyps    adenomatous  . Depression   . Hypertension   . Iron deficiency   . Kidney disease    renal angiomyolipomas  . Liver cyst   . Melanosis   . 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 COLONOSCOPY W/ ENDOSCOPIC [place]    . NEPHRECTOMY    . RENAL ARTERY EMBOLIZATION      Family Psychiatric History:  Family History  Problem Relation Age of Onset  . Lung cancer Father        smoker  . Anxiety disorder Mother   . ADD / ADHD Mother   . Depression Mother   . Diabetes Unknown        a lot of relatives  . ADD / ADHD Daughter   . Bipolar disorder Daughter   . Suicidality Neg Hx     Social History:  Social History   Socioeconomic History  . Marital status: Married    Spouse name: Not on file  . Number of children: Not on file  . Years of education: Not on file  . Highest education level: Not on file  Occupational History  . Not on file  Social Needs  . Financial resource strain: Not on file  . Food insecurity:    Worry: Not on file  Inability: Not on file  . Transportation needs:    Medical: Not on file    Non-medical: Not on file  Tobacco Use  . Smoking status: Never Smoker  . Smokeless tobacco: Never Used  Substance and Sexual Activity  . Alcohol use: No    Alcohol/week: 0.0 standard drinks  . Drug use: No  . Sexual activity: Yes    Partners: Male    Birth control/protection: None  Lifestyle  . Physical activity:    Days per week: Not on file    Minutes per session: Not on file  . Stress: Not on file  Relationships  . Social connections:    Talks on phone: Not on file    Gets together: Not on file    Attends religious service: Not on file    Active member of club or organization: Not on file     Attends meetings of clubs or organizations: Not on file    Relationship status: Not on file  Other Topics Concern  . Not on file  Social History Narrative  . Not on file    Allergies: No Known Allergies  Metabolic Disorder Labs: No results found for: HGBA1C, MPG No results found for: PROLACTIN No results found for: CHOL, TRIG, HDL, CHOLHDL, VLDL, LDLCALC Lab Results  Component Value Date   TSH 3.15 10/27/2017    Therapeutic Level Labs: No results found for: LITHIUM No results found for: VALPROATE No components found for:  CBMZ  Current Medications: Current Outpatient Medications  Medication Sig Dispense Refill  . ALPRAZolam (XANAX) 1 MG tablet TAKE 1 TABLET BY MOUTH 4 TIMES A DAY AS NEEDED FOR ANXIETY 120 tablet 0  . amLODipine (NORVASC) 10 MG tablet Take 10 mg by mouth daily.    Marland Kitchen amphetamine-dextroamphetamine (ADDERALL) 30 MG tablet Take 1 tablet by mouth 2 (two) times daily. 60 tablet 0  . amphetamine-dextroamphetamine (ADDERALL) 30 MG tablet Take 1 tablet by mouth 2 (two) times daily. 60 tablet 0  . amphetamine-dextroamphetamine (ADDERALL) 30 MG tablet Take 1 tablet by mouth 2 (two) times daily. 60 tablet 0  . labetalol (NORMODYNE) 200 MG tablet Take 200 mg by mouth 3 (three) times daily.    Marland Kitchen lamoTRIgine (LAMICTAL) 100 MG tablet Take 1 tablet (100 mg total) by mouth 2 (two) times daily. 180 tablet 0  . losartan (COZAAR) 100 MG tablet Take 100 mg by mouth daily.    . QUEtiapine (SEROQUEL) 300 MG tablet Take 1 tablet (300 mg total) by mouth at bedtime. 90 tablet 0  . sertraline (ZOLOFT) 100 MG tablet Take 2 tablets (200 mg total) by mouth daily. 180 tablet 0   No current facility-administered medications for this visit.      Musculoskeletal: Strength & Muscle Tone: within normal limits Gait & Station: normal Patient leans: N/A  Psychiatric Specialty Exam: ROS  Blood pressure 126/74, pulse 74, height 5\' 9"  (1.753 m), weight 219 lb (99.3 kg).Body mass index is 32.34  kg/m.  General Appearance: Fairly Groomed  Eye Contact:  Good  Speech:  Clear and Coherent and Normal Rate  Volume:  Normal  Mood:  Anxious and Depressed  Affect:  Congruent  Thought Process:  Coherent and Descriptions of Associations: Intact  Orientation:  Full (Time, Place, and Person)  Thought Content:  Rumination  Suicidal Thoughts:  No  Homicidal Thoughts:  No  Memory:  Immediate;   Fair  Judgement:  Fair  Insight:  Fair  Psychomotor Activity:  Normal  Concentration:  Concentration: Poor  Recall:  Smiley Houseman of Knowledge:  Good  Language:  Good  Akathisia:  No  Handed:  Right  AIMS (if indicated):     Assets:  Communication Skills Desire for [place] Talents/Skills Transportation  ADL's:  Intact  Cognition:  WNL  Sleep:   good     Screenings: PHQ2-9     Office Visit from 04/17/2014 in [facility] for Infectious Disease  PHQ-2 Total Score  0      I reviewed the information below on 06/09/2018 and have updated it Assessment and Plan: Bipolar disorder; GAD; ADHD-inattentive type; rule out OCD    Medication management with supportive therapy. Risks and benefits, side effects and alternative treatment options discussed with patient. Pt was given an opportunity to ask questions about medication, illness, and treatment. All current psychiatric medications have been reviewed and discussed with the patient and adjusted as clinically appropriate. The patient has been provided an accurate and updated list of the medications being now prescribed. Patient expressed understanding of how their medications were to be used.  Pt verbalized understanding and verbal consent obtained for treatment.   Status of current problems: ongoing  Meds: Zoloft 200 mg p.o. daily for depression and GAD Seroquel 300 mg p.o. nightly for bipolar disorder Lamictal 100 mg p.o. twice daily for bipolar disorder Adderall 30 mg p.o. twice daily for  ADHD Xanax 1 mg p.o. 4 times daily as needed anxiety May consider Effexor XR in the future   Labs: none  Therapy: brief supportive therapy provided. Discussed psychosocial stressors in detail.    Consultations: Encouraged to follow up with PCP as needed  Pt denies SI and is at an acute low risk for suicide. Patient told to call clinic if any problems occur. Patient advised to go to ER if they should develop SI/HI, side effects, or if symptoms worsen. Has crisis numbers to call if needed. Pt verbalized understanding.  F/up in 2 months or sooner if needed     Charlcie Cradle, MD 06/09/2018, 4:50 PM

## 2018-07-08 ENCOUNTER — Other Ambulatory Visit (HOSPITAL_COMMUNITY): Payer: Self-pay | Admitting: Psychiatry

## 2018-07-08 DIAGNOSIS — F411 Generalized anxiety disorder: Secondary | ICD-10-CM

## 2018-07-11 DIAGNOSIS — M25561 Pain in right knee: Secondary | ICD-10-CM | POA: Diagnosis not present

## 2018-07-11 DIAGNOSIS — Z6832 Body mass index (BMI) 32.0-32.9, adult: Secondary | ICD-10-CM | POA: Diagnosis not present

## 2018-07-11 DIAGNOSIS — E669 Obesity, unspecified: Secondary | ICD-10-CM | POA: Diagnosis not present

## 2018-07-11 DIAGNOSIS — M7989 Other specified soft tissue disorders: Secondary | ICD-10-CM | POA: Diagnosis not present

## 2018-07-11 DIAGNOSIS — M2550 Pain in unspecified joint: Secondary | ICD-10-CM | POA: Diagnosis not present

## 2018-07-25 DIAGNOSIS — M2550 Pain in unspecified joint: Secondary | ICD-10-CM | POA: Diagnosis not present

## 2018-07-25 DIAGNOSIS — M7989 Other specified soft tissue disorders: Secondary | ICD-10-CM | POA: Diagnosis not present

## 2018-07-25 DIAGNOSIS — Z23 Encounter for immunization: Secondary | ICD-10-CM | POA: Diagnosis not present

## 2018-07-25 DIAGNOSIS — E669 Obesity, unspecified: Secondary | ICD-10-CM | POA: Diagnosis not present

## 2018-08-03 DIAGNOSIS — M25561 Pain in right knee: Secondary | ICD-10-CM | POA: Diagnosis not present

## 2018-08-08 DIAGNOSIS — M25561 Pain in right knee: Secondary | ICD-10-CM | POA: Diagnosis not present

## 2018-08-11 ENCOUNTER — Ambulatory Visit (HOSPITAL_COMMUNITY): Payer: Self-pay | Admitting: Psychiatry

## 2018-08-11 DIAGNOSIS — M25561 Pain in right knee: Secondary | ICD-10-CM | POA: Diagnosis not present

## 2018-08-30 DIAGNOSIS — M25561 Pain in right knee: Secondary | ICD-10-CM | POA: Diagnosis not present

## 2018-08-30 DIAGNOSIS — S83281D Other tear of lateral meniscus, current injury, right knee, subsequent encounter: Secondary | ICD-10-CM | POA: Diagnosis not present

## 2018-09-01 ENCOUNTER — Telehealth (HOSPITAL_COMMUNITY): Payer: Self-pay

## 2018-09-01 ENCOUNTER — Encounter (HOSPITAL_COMMUNITY): Payer: Self-pay | Admitting: *Deleted

## 2018-09-01 ENCOUNTER — Other Ambulatory Visit (HOSPITAL_COMMUNITY): Payer: Self-pay | Admitting: Psychiatry

## 2018-09-01 DIAGNOSIS — F411 Generalized anxiety disorder: Secondary | ICD-10-CM

## 2018-09-01 DIAGNOSIS — F900 Attention-deficit hyperactivity disorder, predominantly inattentive type: Secondary | ICD-10-CM

## 2018-09-01 MED ORDER — AMPHETAMINE-DEXTROAMPHETAMINE 30 MG PO TABS: 30.0000 mg | ORAL_TABLET | Freq: Two times a day (BID) | ORAL | 0 refills | Status: DC

## 2018-09-01 MED ORDER — ALPRAZOLAM 1 MG PO TABS: 1.0000 mg | ORAL_TABLET | Freq: Four times a day (QID) | ORAL | 0 refills | Status: DC | PRN

## 2018-09-01 NOTE — Telephone Encounter (Signed)
Patient needs refills of her Klonopin and her Adderall sent to [facility] on [address]. Patient is a little early, she is leaving the country and needs them a couple of days early. Please review and advise, thank you

## 2018-09-22 ENCOUNTER — Other Ambulatory Visit (HOSPITAL_COMMUNITY): Payer: Self-pay | Admitting: Psychiatry

## 2018-09-22 DIAGNOSIS — F3130 Bipolar disorder, current episode depressed, mild or moderate severity, unspecified: Secondary | ICD-10-CM

## 2018-09-22 NOTE — Patient Instructions (Signed)
Melissa Ochoa  09/22/2018   Your procedure is scheduled on: 09-27-18   Report to [facility] Main  Entrance      Report to admitting at 5:30AM    Call this number if you have problems the morning of surgery [phone]     Remember: Do not eat food or drink liquids :After Midnight. BRUSH YOUR TEETH MORNING OF SURGERY AND RINSE YOUR MOUTH OUT, NO CHEWING GUM CANDY OR MINTS.     Take these medicines the morning of surgery with A SIP OF WATER: labetalol, lamotrigine, sertraline, xanax if needed                                You may not have any metal on your body including hair pins and              piercings  Do not wear jewelry, make-up, lotions, powders or perfumes, deodorant             Do not wear nail polish.  Do not shave  48 hours prior to surgery.     Do not bring valuables to the hospital. [place].  Contacts, dentures or bridgework may not be worn into surgery.      Patients discharged the day of surgery will not be allowed to drive home.  Name and phone number of your driver:  Special Instructions: N/A              Please read over the following fact sheets you were given: _____________________________________________________________________             [facility] - Preparing for Surgery Before surgery, you can play an important role.  Because skin is not sterile, your skin needs to be as free of germs as possible.  You can reduce the number of germs on your skin by washing with CHG (chlorahexidine gluconate) soap before surgery.  CHG is an antiseptic cleaner which kills germs and bonds with the skin to continue killing germs even after washing. Please DO NOT use if you have an allergy to CHG or antibacterial soaps.  If your skin becomes reddened/irritated stop using the CHG and inform your nurse when you arrive at Short Stay. Do not shave (including legs and underarms) for at least  48 hours prior to the first CHG shower.  You may shave your face/neck. Please follow these instructions carefully:  1.  Shower with CHG Soap the night before surgery and the  morning of Surgery.  2.  If you choose to wash your hair, wash your hair first as usual with your  normal  shampoo.  3.  After you shampoo, rinse your hair and body thoroughly to remove the  shampoo.                           4.  Use CHG as you would any other liquid soap.  You can apply chg directly  to the skin and wash                       Gently with a scrungie or clean washcloth.  5.  Apply the CHG Soap to your body  ONLY FROM THE NECK DOWN.   Do not use on face/ open                           Wound or open sores. Avoid contact with eyes, ears mouth and genitals (private parts).                       Wash face,  Genitals (private parts) with your normal soap.             6.  Wash thoroughly, paying special attention to the area where your surgery  will be performed.  7.  Thoroughly rinse your body with warm water from the neck down.  8.  DO NOT shower/wash with your normal soap after using and rinsing off  the CHG Soap.                9.  Pat yourself dry with a clean towel.            10.  Wear clean pajamas.            11.  Place clean sheets on your bed the night of your first shower and do not  sleep with pets. Day of Surgery : Do not apply any lotions/deodorants the morning of surgery.  Please wear clean clothes to the hospital/surgery center.  FAILURE TO FOLLOW THESE INSTRUCTIONS MAY RESULT IN THE CANCELLATION OF YOUR SURGERY PATIENT SIGNATURE_________________________________  NURSE SIGNATURE__________________________________  ________________________________________________________________________   [name]  An incentive spirometer is a tool that can help keep your lungs clear and active. This tool measures how well you are filling your lungs with each breath. Taking long deep breaths may  help reverse or decrease the chance of developing breathing (pulmonary) problems (especially infection) following:  A long period of time when you are unable to move or be active. BEFORE THE PROCEDURE   If the spirometer includes an indicator to show your best effort, your nurse or respiratory therapist will set it to a desired goal.  If possible, sit up straight or lean slightly forward. Try not to slouch.  Hold the incentive spirometer in an upright position. INSTRUCTIONS FOR USE  1. Sit on the edge of your bed if possible, or sit up as far as you can in bed or on a chair. 2. Hold the incentive spirometer in an upright position. 3. Breathe out normally. 4. Place the mouthpiece in your mouth and seal your lips tightly around it. 5. Breathe in slowly and as deeply as possible, raising the piston or the ball toward the top of the column. 6. Hold your breath for 3-5 seconds or for as long as possible. Allow the piston or ball to fall to the bottom of the column. 7. Remove the mouthpiece from your mouth and breathe out normally. 8. Rest for a few seconds and repeat Steps 1 through 7 at least 10 times every 1-2 hours when you are awake. Take your time and take a few normal breaths between deep breaths. 9. The spirometer may include an indicator to show your best effort. Use the indicator as a goal to work toward during each repetition. 10. After each set of 10 deep breaths, practice coughing to be sure your lungs are clear. If you have an incision (the cut made at the time of surgery), support your incision when coughing by placing a pillow or rolled up towels firmly against it. Once  you are able to get out of bed, walk around indoors and cough well. You may stop using the incentive spirometer when instructed by your caregiver.  RISKS AND COMPLICATIONS  Take your time so you do not get dizzy or light-headed.  If you are in pain, you may need to take or ask for pain medication before doing  incentive spirometry. It is harder to take a deep breath if you are having pain. AFTER USE  Rest and breathe slowly and easily.  It can be helpful to keep track of a log of your progress. Your caregiver can provide you with a simple table to help with this. If you are using the spirometer at home, follow these instructions: [place] IF:   You are having difficultly using the spirometer.  You have trouble using the spirometer as often as instructed.  Your pain medication is not giving enough relief while using the spirometer.  You develop fever of 100.5 F (38.1 C) or higher. SEEK IMMEDIATE MEDICAL CARE IF:   You cough up bloody sputum that had not been present before.  You develop fever of 102 F (38.9 C) or greater.  You develop worsening pain at or near the incision site. MAKE SURE YOU:   Understand these instructions.  Will watch your condition.  Will get help right away if you are not doing well or get worse. Document Released: 01/25/2007 Document Revised: 12/07/2011 Document Reviewed: 03/28/2007 [facility] Patient Information 2014 [place], [place].   ________________________________________________________________________

## 2018-09-22 NOTE — Progress Notes (Signed)
Surgical clearance 08-31-18 on chart

## 2018-09-23 ENCOUNTER — Other Ambulatory Visit: Payer: Self-pay

## 2018-09-23 ENCOUNTER — Encounter (HOSPITAL_COMMUNITY): Payer: Self-pay

## 2018-09-23 ENCOUNTER — Encounter (HOSPITAL_COMMUNITY)
Admission: RE | Admit: 2018-09-23 | Discharge: 2018-09-23 | Disposition: A | Discharge status: Home / Self Care | Payer: Medicare Other | Source: Ambulatory Visit | Attending: Orthopedic Surgery | Admitting: Orthopedic Surgery

## 2018-09-23 DIAGNOSIS — I10 Essential (primary) hypertension: Secondary | ICD-10-CM | POA: Insufficient documentation

## 2018-09-23 DIAGNOSIS — X58XXXA Exposure to other specified factors, initial encounter: Secondary | ICD-10-CM | POA: Diagnosis not present

## 2018-09-23 DIAGNOSIS — Z01818 Encounter for other preprocedural examination: Secondary | ICD-10-CM | POA: Diagnosis not present

## 2018-09-23 DIAGNOSIS — S83281A Other tear of lateral meniscus, current injury, right knee, initial encounter: Secondary | ICD-10-CM | POA: Diagnosis not present

## 2018-09-23 DIAGNOSIS — R9431 Abnormal electrocardiogram [ECG] [EKG]: Secondary | ICD-10-CM | POA: Diagnosis not present

## 2018-09-23 LAB — CBC WITH DIFFERENTIAL/PLATELET
Abs Immature Granulocytes: 0.41 10*3/uL — ABNORMAL HIGH (ref 0.00–0.07)
Basophils Absolute: 0.1 10*3/uL (ref 0.0–0.1)
Basophils Relative: 1 %
Eosinophils Absolute: 0.5 10*3/uL (ref 0.0–0.5)
Eosinophils Relative: 6 %
HCT: 38.3 % (ref 36.0–46.0)
Hemoglobin: 12 g/dL (ref 12.0–15.0)
Immature Granulocytes: 5 %
Lymphocytes Relative: 19 %
Lymphs Abs: 1.6 10*3/uL (ref 0.7–4.0)
MCH: 29.8 pg (ref 26.0–34.0)
MCHC: 31.3 g/dL (ref 30.0–36.0)
MCV: 95 fL (ref 80.0–100.0)
Monocytes Absolute: 0.7 10*3/uL (ref 0.1–1.0)
Monocytes Relative: 8 %
Neutro Abs: 4.9 10*3/uL (ref 1.7–7.7)
Neutrophils Relative %: 61 %
Platelets: 292 10*3/uL (ref 150–400)
RBC: 4.03 MIL/uL (ref 3.87–5.11)
RDW: 15.1 % (ref 11.5–15.5)
WBC: 8.2 10*3/uL (ref 4.0–10.5)
nRBC: 0 % (ref 0.0–0.2)

## 2018-09-23 LAB — COMPREHENSIVE METABOLIC PANEL
ALT: 22 U/L (ref 0–44)
AST: 21 U/L (ref 15–41)
Albumin: 4.4 g/dL (ref 3.5–5.0)
Alkaline Phosphatase: 88 U/L (ref 38–126)
Anion gap: 9 (ref 5–15)
BUN: 21 mg/dL — ABNORMAL HIGH (ref 6–20)
CO2: 26 mmol/L (ref 22–32)
Calcium: 9.3 mg/dL (ref 8.9–10.3)
Chloride: 106 mmol/L (ref 98–111)
Creatinine, Ser: 0.82 mg/dL (ref 0.44–1.00)
GFR calc Af Amer: >60 mL/min (ref >60)
GFR calc non Af Amer: >60 mL/min (ref >60)
Glucose, Bld: 91 mg/dL (ref 70–99)
Potassium: 4.3 mmol/L (ref 3.5–5.1)
Sodium: 141 mmol/L (ref 135–145)
Total Bilirubin: 0.4 mg/dL (ref 0.3–1.2)
Total Protein: 6.9 g/dL (ref 6.5–8.1)

## 2018-09-23 LAB — APTT: aPTT: 32 seconds (ref 24–36)

## 2018-09-23 LAB — PROTIME-INR
INR: 0.94
Prothrombin Time: 12.5 seconds (ref 11.4–15.2)

## 2018-09-26 NOTE — Anesthesia Preprocedure Evaluation (Addendum)
Anesthesia Evaluation  Patient identified by MRN, date of birth, ID band Patient awake    Reviewed: Allergy & Precautions, NPO status , Patient's Chart, lab work & pertinent test results, reviewed documented beta blocker date and time   History of Anesthesia Complications Negative for: history of anesthetic complications  Airway Mallampati: III  TM Distance: >3 FB Neck ROM: Full    Dental  (+) Dental Advisory Given, Teeth Intact   Pulmonary neg pulmonary ROS,    breath sounds clear to auscultation       Cardiovascular hypertension, Pt. on medications and Pt. on home beta blockers (-) angina+CHF   Rhythm:Regular Rate:Normal     Neuro/Psych PSYCHIATRIC DISORDERS Anxiety Depression Bipolar Disorder negative neurological ROS     GI/Hepatic negative GI ROS, Neg liver ROS,   Endo/Other   Obesity   Renal/GU Renal disease Renal angiomyolipomas      Musculoskeletal negative musculoskeletal ROS (+)   Abdominal   Peds  (+) ADHD Hematology negative hematology ROS (+)   Anesthesia Other Findings Tuberous sclerosis  MRI Abd 2015 - IMPRESSION: Stable benign hepatic cysts, and focal nodular hyperplasia in the posterior right hepatic lobe. Stable multiple left renal enhancing masses, most of which are indeterminate by imaging criteria. Although these presumably reflect renal angiomyolipomas in a patient with tuberous sclerosis, renal cell carcinoma cannot definitely be excluded. Continued yearly followup by MRI is recommended.  Reproductive/Obstetrics                            Anesthesia Physical Anesthesia Plan  ASA: II  Anesthesia Plan: General   Post-op Pain Management:    Induction: Intravenous  PONV Risk Score and Plan: 3 and Treatment may vary due to age or medical condition, Ondansetron, Dexamethasone and Midazolam  Airway Management Planned: LMA  Additional Equipment:  None  Intra-op Plan:   Post-operative Plan: Extubation in OR  Informed Consent: I have reviewed the patients History and Physical, chart, labs and discussed the procedure including the risks, benefits and alternatives for the proposed anesthesia with the patient or authorized representative who has indicated his/her understanding and acceptance.   Dental advisory given  Plan Discussed with: CRNA and Anesthesiologist  Anesthesia Plan Comments:        Anesthesia Quick Evaluation

## 2018-09-26 NOTE — Progress Notes (Signed)
abnornal ekg at pre-op . Chart given to APP for review

## 2018-09-27 ENCOUNTER — Encounter (HOSPITAL_COMMUNITY): Payer: Self-pay

## 2018-09-27 ENCOUNTER — Encounter (HOSPITAL_COMMUNITY)
Admission: RE | Disposition: A | Discharge status: Home / Self Care | Payer: Self-pay | Source: Home / Self Care | Attending: Orthopedic Surgery

## 2018-09-27 ENCOUNTER — Ambulatory Visit (HOSPITAL_COMMUNITY): Payer: Medicare Other | Admitting: Anesthesiology

## 2018-09-27 ENCOUNTER — Ambulatory Visit (HOSPITAL_COMMUNITY)
Admission: RE | Admit: 2018-09-27 | Discharge: 2018-09-27 | Disposition: A | Discharge status: Home / Self Care | Payer: Medicare Other | Attending: Orthopedic Surgery | Admitting: Orthopedic Surgery

## 2018-09-27 ENCOUNTER — Ambulatory Visit (HOSPITAL_COMMUNITY): Payer: Medicare Other | Admitting: Physician Assistant

## 2018-09-27 DIAGNOSIS — S83281A Other tear of lateral meniscus, current injury, right knee, initial encounter: Secondary | ICD-10-CM | POA: Diagnosis not present

## 2018-09-27 DIAGNOSIS — X58XXXA Exposure to other specified factors, initial encounter: Secondary | ICD-10-CM | POA: Diagnosis not present

## 2018-09-27 DIAGNOSIS — Z79899 Other long term (current) drug therapy: Secondary | ICD-10-CM | POA: Diagnosis not present

## 2018-09-27 DIAGNOSIS — S83272A Complex tear of lateral meniscus, current injury, left knee, initial encounter: Secondary | ICD-10-CM | POA: Insufficient documentation

## 2018-09-27 DIAGNOSIS — I509 Heart failure, unspecified: Secondary | ICD-10-CM | POA: Insufficient documentation

## 2018-09-27 DIAGNOSIS — I110 Hypertensive heart disease with heart failure: Secondary | ICD-10-CM | POA: Diagnosis not present

## 2018-09-27 DIAGNOSIS — F419 Anxiety disorder, unspecified: Secondary | ICD-10-CM | POA: Diagnosis not present

## 2018-09-27 DIAGNOSIS — F909 Attention-deficit hyperactivity disorder, unspecified type: Secondary | ICD-10-CM | POA: Insufficient documentation

## 2018-09-27 DIAGNOSIS — M1712 Unilateral primary osteoarthritis, left knee: Secondary | ICD-10-CM | POA: Diagnosis not present

## 2018-09-27 DIAGNOSIS — F319 Bipolar disorder, unspecified: Secondary | ICD-10-CM | POA: Diagnosis not present

## 2018-09-27 HISTORY — PX: KNEE ARTHROSCOPY WITH DRILLING/MICROFRACTURE: SHX6425

## 2018-09-27 LAB — HCG, SERUM, QUALITATIVE: Preg, Serum: NEGATIVE

## 2018-09-27 SURGERY — ARTHROSCOPY, KNEE, WITH ABRASION ARTHROPLASTY OR MICROFRACTURE
Anesthesia: General | Site: Knee | Laterality: Right

## 2018-09-27 MED ORDER — LIDOCAINE 2% (20 MG/ML) 5 ML SYRINGE
INTRAMUSCULAR | Status: AC
  Filled 2018-09-27: qty 5

## 2018-09-27 MED ORDER — BACITRACIN ZINC 500 UNIT/GM EX OINT
TOPICAL_OINTMENT | CUTANEOUS | Status: AC
  Filled 2018-09-27: qty 28.35

## 2018-09-27 MED ORDER — ONDANSETRON HCL 4 MG/2ML IJ SOLN
INTRAMUSCULAR | Status: DC | PRN
  Administered 2018-09-27: 4 mg via INTRAVENOUS

## 2018-09-27 MED ORDER — LACTATED RINGERS IV SOLN
INTRAVENOUS | Status: DC
  Administered 2018-09-27: 07:00:00 via INTRAVENOUS

## 2018-09-27 MED ORDER — PROPOFOL 10 MG/ML IV BOLUS
INTRAVENOUS | Status: DC | PRN
  Administered 2018-09-27: 150 mg via INTRAVENOUS

## 2018-09-27 MED ORDER — ROCURONIUM BROMIDE 10 MG/ML (PF) SYRINGE
PREFILLED_SYRINGE | INTRAVENOUS | Status: AC
  Filled 2018-09-27: qty 10

## 2018-09-27 MED ORDER — MIDAZOLAM HCL 2 MG/2ML IJ SOLN
INTRAMUSCULAR | Status: AC
  Filled 2018-09-27: qty 2

## 2018-09-27 MED ORDER — EPHEDRINE 5 MG/ML INJ
INTRAVENOUS | Status: AC
  Filled 2018-09-27: qty 10

## 2018-09-27 MED ORDER — POVIDONE-IODINE 10 % EX SWAB
2.0000 "application " | Freq: Once | CUTANEOUS | Status: AC
  Administered 2018-09-27: 2 via TOPICAL

## 2018-09-27 MED ORDER — FENTANYL CITRATE (PF) 250 MCG/5ML IJ SOLN
INTRAMUSCULAR | Status: AC
  Filled 2018-09-27: qty 5

## 2018-09-27 MED ORDER — OXYCODONE HCL 5 MG PO TABS
ORAL_TABLET | ORAL | Status: AC
  Filled 2018-09-27: qty 1

## 2018-09-27 MED ORDER — EPHEDRINE SULFATE-NACL 50-0.9 MG/10ML-% IV SOSY
PREFILLED_SYRINGE | INTRAVENOUS | Status: DC | PRN
  Administered 2018-09-27: 10 mg via INTRAVENOUS

## 2018-09-27 MED ORDER — OXYCODONE HCL 5 MG PO TABS
ORAL_TABLET | ORAL | Status: AC
  Administered 2018-09-27: 5 mg
  Filled 2018-09-27: qty 1

## 2018-09-27 MED ORDER — ONDANSETRON HCL 4 MG/2ML IJ SOLN
INTRAMUSCULAR | Status: AC
  Filled 2018-09-27: qty 2

## 2018-09-27 MED ORDER — PROPOFOL 10 MG/ML IV BOLUS
INTRAVENOUS | Status: AC
  Filled 2018-09-27: qty 20

## 2018-09-27 MED ORDER — LIDOCAINE 2% (20 MG/ML) 5 ML SYRINGE
INTRAMUSCULAR | Status: DC | PRN
  Administered 2018-09-27: 60 mg via INTRAVENOUS

## 2018-09-27 MED ORDER — OXYCODONE HCL 5 MG/5ML PO SOLN
5.0000 mg | Freq: Once | ORAL | Status: AC | PRN
  Filled 2018-09-27: qty 5

## 2018-09-27 MED ORDER — DEXAMETHASONE SODIUM PHOSPHATE 10 MG/ML IJ SOLN
INTRAMUSCULAR | Status: AC
  Filled 2018-09-27: qty 1

## 2018-09-27 MED ORDER — OXYCODONE HCL 5 MG PO TABS
5.0000 mg | ORAL_TABLET | Freq: Once | ORAL | Status: AC | PRN
  Administered 2018-09-27: 5 mg via ORAL

## 2018-09-27 MED ORDER — BUPIVACAINE-EPINEPHRINE 0.25% -1:200000 IJ SOLN
INTRAMUSCULAR | Status: DC | PRN
  Administered 2018-09-27: 30 mL

## 2018-09-27 MED ORDER — PHENYLEPHRINE 40 MCG/ML (10ML) SYRINGE FOR IV PUSH (FOR BLOOD PRESSURE SUPPORT)
PREFILLED_SYRINGE | INTRAVENOUS | Status: AC
  Filled 2018-09-27: qty 10

## 2018-09-27 MED ORDER — CHLORHEXIDINE GLUCONATE 4 % EX LIQD: 60.0000 mL | Freq: Once | CUTANEOUS | Status: DC

## 2018-09-27 MED ORDER — LACTATED RINGERS IR SOLN
Status: DC | PRN
  Administered 2018-09-27: 6000 mL

## 2018-09-27 MED ORDER — FENTANYL CITRATE (PF) 100 MCG/2ML IJ SOLN
INTRAMUSCULAR | Status: DC | PRN
  Administered 2018-09-27 (×5): 50 ug via INTRAVENOUS

## 2018-09-27 MED ORDER — CEFAZOLIN SODIUM-DEXTROSE 2-4 GM/100ML-% IV SOLN
2.0000 g | INTRAVENOUS | Status: AC
  Administered 2018-09-27: 2 g via INTRAVENOUS
  Filled 2018-09-27: qty 100

## 2018-09-27 MED ORDER — BUPIVACAINE-EPINEPHRINE (PF) 0.25% -1:200000 IJ SOLN
INTRAMUSCULAR | Status: AC
  Filled 2018-09-27: qty 30

## 2018-09-27 MED ORDER — ONDANSETRON HCL 4 MG/2ML IJ SOLN: 4.0000 mg | Freq: Once | INTRAMUSCULAR | Status: DC | PRN

## 2018-09-27 MED ORDER — FENTANYL CITRATE (PF) 100 MCG/2ML IJ SOLN: 25.0000 ug | INTRAMUSCULAR | Status: DC | PRN

## 2018-09-27 MED ORDER — BACITRACIN ZINC 500 UNIT/GM EX OINT
TOPICAL_OINTMENT | CUTANEOUS | Status: DC | PRN
  Administered 2018-09-27: 1 via TOPICAL

## 2018-09-27 MED ORDER — MIDAZOLAM HCL 5 MG/5ML IJ SOLN
INTRAMUSCULAR | Status: DC | PRN
  Administered 2018-09-27: 2 mg via INTRAVENOUS

## 2018-09-27 MED ORDER — DEXAMETHASONE SODIUM PHOSPHATE 10 MG/ML IJ SOLN
INTRAMUSCULAR | Status: DC | PRN
  Administered 2018-09-27: 10 mg via INTRAVENOUS

## 2018-09-27 SURGICAL SUPPLY — 31 items
BANDAGE ACE 4X5 VEL STRL LF (GAUZE/BANDAGES/DRESSINGS) ×2 IMPLANT
BANDAGE ACE 6X5 VEL STRL LF (GAUZE/BANDAGES/DRESSINGS) ×1 IMPLANT
BLADE GREAT WHITE 4.2 (BLADE) ×2 IMPLANT
BLADE SURG SZ11 CARB STEEL (BLADE) IMPLANT
BNDG CMPR 82X61 PLY HI ABS (GAUZE/BANDAGES/DRESSINGS) ×1
BNDG COHESIVE 6X5 TAN STRL LF (GAUZE/BANDAGES/DRESSINGS) ×2 IMPLANT
BNDG CONFORM 6X.82 1P STRL (GAUZE/BANDAGES/DRESSINGS) ×1 IMPLANT
COVER SURGICAL LIGHT HANDLE (MISCELLANEOUS) ×2 IMPLANT
COVER WAND RF STERILE (DRAPES) IMPLANT
DRAPE SHEET LG 3/4 BI-LAMINATE (DRAPES) ×2 IMPLANT
DRSG EMULSION OIL 3X3 NADH (GAUZE/BANDAGES/DRESSINGS) ×1 IMPLANT
DRSG PAD ABDOMINAL 8X10 ST (GAUZE/BANDAGES/DRESSINGS) ×4 IMPLANT
DURAPREP 26ML APPLICATOR (WOUND CARE) ×2 IMPLANT
GAUZE SPONGE 4X4 12PLY STRL (GAUZE/BANDAGES/DRESSINGS) ×1 IMPLANT
GLOVE BIOGEL PI IND STRL 8.5 (GLOVE) ×1 IMPLANT
GLOVE BIOGEL PI INDICATOR 8.5 (GLOVE) ×1
GLOVE ECLIPSE 8.0 STRL XLNG CF (GLOVE) ×2 IMPLANT
GOWN STRL REUS W/TWL LRG LVL3 (GOWN DISPOSABLE) ×2 IMPLANT
GOWN STRL REUS W/TWL XL LVL3 (GOWN DISPOSABLE) ×4 IMPLANT
KIT BASIN OR (CUSTOM PROCEDURE TRAY) IMPLANT
MANIFOLD NEPTUNE II (INSTRUMENTS) ×2 IMPLANT
PACK ARTHROSCOPY WL (CUSTOM PROCEDURE TRAY) ×2 IMPLANT
PACK ICE MAXI GEL EZY WRAP (MISCELLANEOUS) ×2 IMPLANT
PAD ABD 8X10 STRL (GAUZE/BANDAGES/DRESSINGS) ×1 IMPLANT
PAD MASON LEG HOLDER (PIN) ×2 IMPLANT
SUT ETHILON 3 0 PS 1 (SUTURE) ×2 IMPLANT
TOWEL OR 17X26 10 PK STRL BLUE (TOWEL DISPOSABLE) ×6 IMPLANT
TUBING ARTHRO INFLOW-ONLY STRL (TUBING) ×2 IMPLANT
TUBING CONNECTING 10 (TUBING) ×2 IMPLANT
WAND HAND CNTRL MULTIVAC 90 (MISCELLANEOUS) ×1 IMPLANT
WRAP KNEE MAXI GEL POST OP (GAUZE/BANDAGES/DRESSINGS) ×6 IMPLANT

## 2018-09-27 NOTE — Anesthesia Procedure Notes (Signed)
Procedure Name: LMA Insertion Date/Time: 09/27/2018 7:59 AM Performed by: Mitzie Na, CRNA Pre-anesthesia Checklist: Patient identified, Emergency Drugs available, Suction available, Patient being monitored and Timeout performed Patient Re-evaluated:Patient Re-evaluated prior to induction Oxygen Delivery Method: Circle system utilized Preoxygenation: Pre-oxygenation with 100% oxygen Induction Type: IV induction LMA: LMA inserted LMA Size: 3.0 Placement Confirmation: positive ETCO2 and breath sounds checked- equal and bilateral Tube secured with: Tape Dental Injury: Teeth and Oropharynx as per pre-operative assessment

## 2018-09-27 NOTE — Progress Notes (Signed)
PACU NURSING NOTE; DC instructions reviewed with pt and husband, walker instruction/ pt teaching done as well, pt able to return demonstration very well. Copy of instructions provided to husband along with additional Rx by MD. Opportunity for questions provided.

## 2018-09-27 NOTE — Anesthesia Postprocedure Evaluation (Signed)
Anesthesia Post Note  Patient: Fareeha V Dambach  Procedure(s) Performed: Right knee arthroscopy with partial lateral menisectomy (Right Knee)     Patient location during evaluation: PACU Anesthesia Type: General Level of consciousness: awake and alert Pain management: pain level controlled Vital Signs Assessment: post-procedure vital signs reviewed and stable Respiratory status: spontaneous breathing, nonlabored ventilation and respiratory function stable Cardiovascular status: blood pressure returned to baseline and stable Postop Assessment: no apparent nausea or vomiting Anesthetic complications: no    Last Vitals:  Vitals:   09/27/18 0930 09/27/18 0951  BP: (!) 126/114 (!) 150/85  Pulse: 71 80  Resp: 16 16  Temp:  36.7 C  SpO2: 94% 99%    Last Pain:  Vitals:   09/27/18 0951  TempSrc:   PainSc: 0-No pain    LLE Motor Response: Purposeful movement (09/27/18 0951) LLE Sensation: Full sensation (09/27/18 0951)   RLE Sensation: Full sensation;No numbness;No pain;No tingling (09/27/18 0951)      Audry Pili

## 2018-09-27 NOTE — Care Management Note (Signed)
Case Management Note  Patient Details  Name: Melissa Ochoa MRN: [id number] Date of Birth: 10/09/1965  Subjective/Objective:                  Patient needs walker for home/patient is in pacu  Action/Plan: Text sent [facility] karen byrd with [facility] for d[facility] needs.  Expected Discharge Date:  09/27/18               Expected Discharge Plan:     In-House Referral:     Discharge planning Services     Post Acute Care Choice:    Choice offered to:     DME Arranged:    DME Agency:     HH Arranged:    HH Agency:     Status of Service:     If discussed at [employer], dates discussed:    Additional Comments:  Leeroy Cha, RN 09/27/2018, 10:36 AM

## 2018-09-27 NOTE — H&P (Signed)
Melissa Ochoa is an 52 y.o. female.   Chief Complaint: Pain and swelling of her rigfht Knee. HPI: Painful,limited motion of her right Knee.  Past Medical History:  Diagnosis Date  . ADHD (attention deficit hyperactivity disorder)   . Anxiety   . Bipolar disorder ([place])   . Cervical disc disease   . Colon polyps    adenomatous  . Depression   . Hypertension   . Iron deficiency   . Kidney disease    renal angiomyolipomas  . Liver cyst   . Melanosis   . 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 COLONOSCOPY W/ ENDOSCOPIC [place]    . NEPHRECTOMY Right   . RENAL ARTERY EMBOLIZATION     2 procdures in the left , 4 in the right      Family History  Problem Relation Age of Onset  . Lung cancer Father        smoker  . Anxiety disorder Mother   . ADD / ADHD Mother   . Depression Mother   . Diabetes Other        a lot of relatives  . ADD / ADHD Daughter   . Bipolar disorder Daughter   . Suicidality Neg Hx    Social History:  reports that she has never smoked. She has never used smokeless tobacco. She reports that she does not drink alcohol or use drugs.  Allergies: No Known Allergies  Medications Prior to Admission  Medication Sig Dispense Refill  . ALPRAZolam (XANAX) 1 MG tablet Take 1 tablet (1 mg total) by mouth 4 (four) times daily as needed for anxiety. (Patient taking differently: Take 1 mg by mouth 3 (three) times daily. ) 120 tablet 0  . labetalol (NORMODYNE) 200 MG tablet Take 200 mg by mouth 3 (three) times daily.    Marland Kitchen lamoTRIgine (LAMICTAL) 100 MG tablet Take 1 tablet (100 mg total) by mouth 2 (two) times daily. 180 tablet 0  . losartan (COZAAR) 50 MG tablet Take 50 mg by mouth daily.     . QUEtiapine (SEROQUEL) 300 MG tablet Take 1 tablet (300 mg total) by mouth at bedtime. 90 tablet 0  . sertraline (ZOLOFT) 100 MG tablet Take 2 tablets (200 mg total) by mouth daily. (Patient taking differently: Take 200 mg by mouth 2 (two)  times daily. ) 180 tablet 0  . amphetamine-dextroamphetamine (ADDERALL) 30 MG tablet Take 1 tablet by mouth 2 (two) times daily. (Patient not taking: Reported on 09/13/2018) 60 tablet 0  . amphetamine-dextroamphetamine (ADDERALL) 30 MG tablet Take 1 tablet by mouth 2 (two) times daily. (Patient not taking: Reported on 09/13/2018) 60 tablet 0  . amphetamine-dextroamphetamine (ADDERALL) 30 MG tablet Take 1 tablet by mouth 2 (two) times daily. (Patient not taking: Reported on 09/13/2018) 60 tablet 0    Results for orders placed or performed during the hospital encounter of 09/27/18 (from the past 48 hour(s))  hCG, serum, qualitative     Status: None   Collection Time: 09/27/18  6:40 AM  Result Value Ref Range   Preg, Serum NEGATIVE NEGATIVE    Comment:        THE SENSITIVITY OF THIS METHODOLOGY IS >10 mIU/mL. Performed at [facility], [place] [address] [place], [place] [address]    No results found.  Review of Systems  Constitutional: Negative.   HENT: Negative.   Eyes: Negative.   Respiratory: Negative.   Cardiovascular: Negative.  Gastrointestinal: Negative.   Genitourinary: Negative.   Musculoskeletal: Positive for joint pain.  Skin: Negative.   Neurological: Negative.   Endo/Heme/Allergies: Negative.   Psychiatric/Behavioral: Negative.     Blood pressure 135/86, pulse 71, temperature 97.8 F (36.6 C), temperature source Oral, resp. rate 16, height 5\' 9"  (1.753 m), weight 97.1 kg, SpO2 99 %. Physical Exam  Constitutional: She appears well-developed.  HENT:  Head: Normocephalic.  Eyes: Pupils are equal, round, and reactive to light.  Neck: Normal range of motion.  Cardiovascular: Normal rate.  Respiratory: Effort normal.  GI: Soft.  Musculoskeletal:        General: Tenderness present.     Comments: Painful ,restricted motion of her right Knee.  Neurological: She is alert.  Skin: Skin is warm.  Psychiatric: She has a normal mood and affect.      Assessment/Plan Diagnostic ArthroscopicLateral Meniscectomy and Possible Microfracture and what ever else needs to be done to correct her problem as we discussed.  Latanya Maudlin, MD 09/27/2018, 7:15 AM

## 2018-09-27 NOTE — Transfer of Care (Signed)
Immediate Anesthesia Transfer of Care Note  Patient: Melissa Ochoa  Procedure(s) Performed: Right knee arthroscopy with partial lateral menisectomy (Right Knee)  Patient Location: PACU  Anesthesia Type:General  Level of Consciousness: awake, alert , oriented and patient cooperative  Airway & Oxygen Therapy: Patient Spontanous Breathing and Patient connected to face mask oxygen  Post-op Assessment: Report given to RN, Post -op Vital signs reviewed and stable and Patient moving all extremities  Post vital signs: Reviewed and stable  Last Vitals:  Vitals Value Taken Time  BP    Temp    Pulse    Resp    SpO2      Last Pain:  Vitals:   09/27/18 0629  TempSrc:   PainSc: 5       Patients Stated Pain Goal: 3 (77/03/40 3524)  Complications: No apparent anesthesia complications

## 2018-09-29 ENCOUNTER — Other Ambulatory Visit (HOSPITAL_COMMUNITY): Payer: Self-pay

## 2018-09-29 ENCOUNTER — Other Ambulatory Visit (HOSPITAL_COMMUNITY): Payer: Self-pay | Admitting: Psychiatry

## 2018-09-29 DIAGNOSIS — F3130 Bipolar disorder, current episode depressed, mild or moderate severity, unspecified: Secondary | ICD-10-CM

## 2018-09-29 DIAGNOSIS — F411 Generalized anxiety disorder: Secondary | ICD-10-CM

## 2018-09-29 DIAGNOSIS — Z5189 Encounter for other specified aftercare: Secondary | ICD-10-CM | POA: Insufficient documentation

## 2018-09-29 MED ORDER — QUETIAPINE FUMARATE 300 MG PO TABS: 300.0000 mg | ORAL_TABLET | Freq: Every day | ORAL | 0 refills | Status: DC

## 2018-09-29 MED ORDER — SERTRALINE HCL 100 MG PO TABS: 200.0000 mg | ORAL_TABLET | Freq: Every day | ORAL | 0 refills | Status: DC

## 2018-09-29 MED ORDER — LAMOTRIGINE 100 MG PO TABS: 100.0000 mg | ORAL_TABLET | Freq: Two times a day (BID) | ORAL | 0 refills | Status: DC

## 2018-09-29 MED ORDER — ALPRAZOLAM 1 MG PO TABS: 1.0000 mg | ORAL_TABLET | Freq: Four times a day (QID) | ORAL | 0 refills | Status: DC

## 2018-09-30 ENCOUNTER — Encounter (HOSPITAL_COMMUNITY): Payer: Self-pay | Admitting: Orthopedic Surgery

## 2018-10-05 NOTE — Op Note (Signed)
Dictated

## 2018-10-05 NOTE — Op Note (Signed)
NAME: Melissa, Ochoa MEDICAL RECORD [id number] ACCOUNT [id number] DATE OF BIRTH:03-Jul-1966 FACILITY: [facility] LOCATION: [facility] PHYSICIAN:Ryle Buscemi A. Boen Sterbenz, MD  OPERATIVE REPORT  DATE OF PROCEDURE:  09/27/2018  PREOPERATIVE DIAGNOSES: 1.  Degenerative arthritis of her knee. 2.  Torn lateral meniscus.    DESCRIPTION OF PROCEDURE:  The patient was taken to surgery at ____ time on 09/27/2018.  At this particular time, the sterile prep and draping was carried out of her lower extremity.  Following that, the appropriate timeout was carried out.  I also  marked the appropriate leg in the holding area.  The knee was placed in the knee holder in the usual fashion.  A small punctate incision was made in the suprapatellar pouch.  Inflow cannula was inserted.  The knee was distended with fluid.  Another small  punctate incision was made in the anterolateral joint.  At this particular time, the arthroscope was entered from the lateral approach, and a complete diagnostic arthroscopy was carried out.  The main findings were that she had early arthritic changes  in the medial compartment.  The lateral compartment was the main issue.  She had a severe complex tear of the lateral meniscus.  A lateral meniscectomy, as I mentioned, was carried out with a cleanout procedure at this time.  The knee was thoroughly  irrigated again.  The remaining part of the knee was explored in the usual fashion.  Several irrigations in the knee were carried out because of the arthritic condition.  After the knee was irrigated, I then closed all 3 punctate incisions with 3-0 nylon  suture.  Sterile Neosporin dressings were applied.  The assistant was the nurse.  Also, at this particular time, she will be instructed to start on the appropriate pain medicines and aspirin 325 mg b.i.d. for 2 weeks as a blood thinner.  She will  ambulate with crutches.  The appropriate instructions were given.  At the end of the procedure prior to  applying the dressing, we injected 20 mL of 0.25% Marcaine with epinephrine into the knee joint.  We also will supplement her with the appropriate  pain medicines I mentioned by mouth.  The patient will be observed in the recovery room prior to her discharge.  After the case, I did discuss the surgery with her husband in regard to future care.  LN/NUANCE  D:10/05/2018 T:10/05/2018 JOB:[id number]

## 2018-10-08 ENCOUNTER — Ambulatory Visit (HOSPITAL_COMMUNITY): Payer: No Typology Code available for payment source | Admitting: Psychiatry

## 2018-10-08 ENCOUNTER — Encounter (HOSPITAL_COMMUNITY): Payer: Self-pay | Admitting: Psychiatry

## 2018-10-08 ENCOUNTER — Ambulatory Visit (INDEPENDENT_AMBULATORY_CARE_PROVIDER_SITE_OTHER): Payer: Medicare Other | Admitting: Psychiatry

## 2018-10-08 DIAGNOSIS — F900 Attention-deficit hyperactivity disorder, predominantly inattentive type: Secondary | ICD-10-CM

## 2018-10-08 DIAGNOSIS — F3130 Bipolar disorder, current episode depressed, mild or moderate severity, unspecified: Secondary | ICD-10-CM | POA: Diagnosis not present

## 2018-10-08 DIAGNOSIS — F411 Generalized anxiety disorder: Secondary | ICD-10-CM | POA: Diagnosis not present

## 2018-10-08 MED ORDER — ALPRAZOLAM 1 MG PO TABS: 1.0000 mg | ORAL_TABLET | Freq: Four times a day (QID) | ORAL | 3 refills | Status: DC

## 2018-10-08 MED ORDER — SERTRALINE HCL 100 MG PO TABS: 200.0000 mg | ORAL_TABLET | Freq: Every day | ORAL | 0 refills | Status: DC

## 2018-10-08 MED ORDER — AMPHETAMINE-DEXTROAMPHETAMINE 30 MG PO TABS: 30.0000 mg | ORAL_TABLET | Freq: Two times a day (BID) | ORAL | 0 refills | Status: DC

## 2018-10-08 MED ORDER — LAMOTRIGINE 100 MG PO TABS: 100.0000 mg | ORAL_TABLET | Freq: Two times a day (BID) | ORAL | 0 refills | Status: DC

## 2018-10-08 MED ORDER — QUETIAPINE FUMARATE 300 MG PO TABS: 300.0000 mg | ORAL_TABLET | Freq: Every day | ORAL | 0 refills | Status: DC

## 2018-10-08 NOTE — Progress Notes (Signed)
[facility] MD/PA/NP OP Progress Note  10/08/2018 2:40 PM Melissa Ochoa  MRN:  [id number]  Chief Complaint:  Chief Complaint    Anxiety; Depression     HPI: Patient reports that nothing is really changed.  She remains frustrated by the numerous physical symptoms and illnesses that she faces.  She reports some frustrations with her husband as well.  She is denying any SI/HI but does occasionally think that it would be easier if she was not around so that she did not have to deal with any of this.  Sleep is on and off.  She is denying any manic or hypomanic-like symptoms.   Visit Diagnosis:    ICD-10-CM   1. GAD (generalized anxiety disorder) F41.1 ALPRAZolam (XANAX) 1 MG tablet  2. Attention deficit hyperactivity disorder (ADHD), predominantly inattentive type F90.0 amphetamine-dextroamphetamine (ADDERALL) 30 MG tablet    amphetamine-dextroamphetamine (ADDERALL) 30 MG tablet    amphetamine-dextroamphetamine (ADDERALL) 30 MG tablet  3. Bipolar I disorder, most recent episode depressed (HCC) F31.30 lamoTRIgine (LAMICTAL) 100 MG tablet    QUEtiapine (SEROQUEL) 300 MG tablet    sertraline (ZOLOFT) 100 MG tablet     Past Psychiatric History:  Anxiety:Yes Bipolar Disorder:Yes Depression:Yes Mania:Yes Psychosis:No Schizophrenia:No Personality Disorder:No Hospitalization for psychiatric illness:No History of Electroconvulsive Shock Therapy:No Prior Suicide Attempts:No    Past Medical History:  Past Medical History:  Diagnosis Date  . ADHD (attention deficit hyperactivity disorder)   . Anxiety   . Bipolar disorder ([place])   . Cervical disc disease   . Colon polyps    adenomatous  . Depression   . Hypertension   . Iron deficiency   . Kidney disease    renal angiomyolipomas  . Liver cyst   . Melanosis   . 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 COLONOSCOPY W/ ENDOSCOPIC [place]    . KNEE ARTHROSCOPY WITH DRILLING/MICROFRACTURE  Right 09/27/2018   Procedure: Right knee arthroscopy with partial lateral menisectomy;  Surgeon: Latanya Maudlin, MD;  Location: [facility];  Service: Orthopedics;  Laterality: Right;  . NEPHRECTOMY Right   . RENAL ARTERY EMBOLIZATION     2 procdures in the left , 4 in the right      Family Psychiatric History:  Family History  Problem Relation Age of Onset  . Lung cancer Father        smoker  . Anxiety disorder Mother   . ADD / ADHD Mother   . Depression Mother   . Diabetes Other        a lot of relatives  . ADD / ADHD Daughter   . Bipolar disorder Daughter   . Suicidality Neg Hx     Social History:  Social History   Socioeconomic History  . Marital status: Married    Spouse name: Not on file  . Number of children: Not on file  . Years of education: Not on file  . Highest education level: Not on file  Occupational History  . Not on file  Social Needs  . Financial resource strain: Not on file  . Food insecurity:    Worry: Not on file    Inability: Not on file  . Transportation needs:    Medical: Not on file    Non-medical: Not on file  Tobacco Use  . Smoking status: Never Smoker  . Smokeless tobacco: Never Used  Substance and Sexual Activity  . Alcohol use: No    Alcohol/week: 0.0 standard drinks  .  Drug use: No  . Sexual activity: Yes    Partners: Male    Birth control/protection: None  Lifestyle  . Physical activity:    Days per week: Not on file    Minutes per session: Not on file  . Stress: Not on file  Relationships  . Social connections:    Talks on phone: Not on file    Gets together: Not on file    Attends religious service: Not on file    Active member of club or organization: Not on file    Attends meetings of clubs or organizations: Not on file    Relationship status: Not on file  Other Topics Concern  . Not on file  Social History Narrative  . Not on file    Allergies: No Known Allergies  Metabolic Disorder Labs: No results found for:  HGBA1C, MPG No results found for: PROLACTIN No results found for: CHOL, TRIG, HDL, CHOLHDL, VLDL, LDLCALC Lab Results  Component Value Date   TSH 3.15 10/27/2017    Therapeutic Level Labs: No results found for: LITHIUM No results found for: VALPROATE No components found for:  CBMZ  Current Medications: Current Outpatient Medications  Medication Sig Dispense Refill  . ALPRAZolam (XANAX) 1 MG tablet Take 1 tablet (1 mg total) by mouth 4 (four) times daily. 120 tablet 3  . amphetamine-dextroamphetamine (ADDERALL) 30 MG tablet Take 1 tablet by mouth 2 (two) times daily. 60 tablet 0  . amphetamine-dextroamphetamine (ADDERALL) 30 MG tablet Take 1 tablet by mouth 2 (two) times daily. 60 tablet 0  . amphetamine-dextroamphetamine (ADDERALL) 30 MG tablet Take 1 tablet by mouth 2 (two) times daily. 60 tablet 0  . labetalol (NORMODYNE) 200 MG tablet Take 200 mg by mouth 3 (three) times daily.    Marland Kitchen lamoTRIgine (LAMICTAL) 100 MG tablet Take 1 tablet (100 mg total) by mouth 2 (two) times daily. 180 tablet 0  . losartan (COZAAR) 50 MG tablet Take 50 mg by mouth daily.     . QUEtiapine (SEROQUEL) 300 MG tablet Take 1 tablet (300 mg total) by mouth at bedtime. 90 tablet 0  . sertraline (ZOLOFT) 100 MG tablet Take 2 tablets (200 mg total) by mouth daily. 180 tablet 0   No current facility-administered medications for this visit.      Musculoskeletal: Strength & Muscle Tone: within normal limits Gait & Station: normal Patient leans: N/A  Psychiatric Specialty Exam: Review of Systems  Constitutional: Negative for chills, diaphoresis and fever.  Musculoskeletal: Positive for back pain, joint pain, myalgias and neck pain.       Right knee pain    Blood pressure 126/80, height 5\' 9"  (1.753 m), weight 210 lb (95.3 kg).Body mass index is 31.01 kg/m.  General Appearance: Casual  Eye Contact:  Good  Speech:  Clear and Coherent and Normal Rate  Volume:  Normal  Mood:  Depressed  Affect:  Congruent   Thought Process:  Goal Directed and Descriptions of Associations: Intact  Orientation:  Full (Time, Place, and Person)  Thought Content:  Logical  Suicidal Thoughts:  No  Homicidal Thoughts:  No  Memory:  Immediate;   Fair  Judgement:  Fair  Insight:  Fair  Psychomotor Activity:  Normal  Concentration:  Concentration: Good  Recall:  Good  Fund of Knowledge:  Good  Language:  Good  Akathisia:  No  Handed:  Right  AIMS (if indicated):     Assets:  Communication Skills Desire for [place] [facility]  ADL's:  Intact  Cognition:  WNL  Sleep:          Screenings: PHQ2-9     Office Visit from 04/17/2014 in [facility]  PHQ-2 Total Score  0      I reviewed the information below on 10/08/2018 and have updated it Assessment and Plan: Bipolar disorder; GAD; ADHD-inattentive type; rule out OCD    Medication management with supportive therapy. Risks and benefits, side effects and alternative treatment options discussed with patient. Pt was given an opportunity to ask questions about medication, illness, and treatment. All current psychiatric medications have been reviewed and discussed with the patient and adjusted as clinically appropriate. The patient has been provided an accurate and updated list of the medications being now prescribed. Patient expressed understanding of how their medications were to be used.  Pt verbalized understanding and verbal consent obtained for treatment.   Status of current problems: worsening depression  Meds: Zoloft 200 mg p.o. daily for depression and GAD Seroquel 300 mg p.o. nightly for bipolar disorder Lamictal 100 mg p.o. twice daily for bipolar disorder Adderall 30 mg p.o. twice daily for ADHD Xanax 1 mg p.o. 4 times daily as needed anxiety May consider Effexor XR in the future Pt does not want meds changed today  Labs: none  Therapy: brief  supportive therapy provided. Discussed psychosocial stressors in detail.    Consultations: Encouraged to follow up with PCP as needed  Pt denies SI and is at an acute low risk for suicide. Patient told to call clinic if any problems occur. Patient advised to go to ER if they should develop SI/HI, side effects, or if symptoms worsen. Has crisis numbers to call if needed. Pt verbalized understanding.  F/up in 2 months or sooner if needed     Charlcie Cradle, MD 10/08/2018, 2:40 PM

## 2018-10-27 ENCOUNTER — Other Ambulatory Visit (HOSPITAL_COMMUNITY): Payer: Self-pay | Admitting: Orthopedic Surgery

## 2018-10-27 ENCOUNTER — Encounter (HOSPITAL_COMMUNITY): Payer: Self-pay

## 2018-10-27 DIAGNOSIS — M25561 Pain in right knee: Secondary | ICD-10-CM | POA: Diagnosis not present

## 2018-10-27 DIAGNOSIS — M79604 Pain in right leg: Secondary | ICD-10-CM

## 2018-10-27 DIAGNOSIS — M7989 Other specified soft tissue disorders: Principal | ICD-10-CM

## 2018-10-27 DIAGNOSIS — Z5189 Encounter for other specified aftercare: Secondary | ICD-10-CM | POA: Diagnosis not present

## 2018-10-28 ENCOUNTER — Encounter (HOSPITAL_COMMUNITY): Payer: Self-pay

## 2018-10-28 ENCOUNTER — Ambulatory Visit (HOSPITAL_COMMUNITY)
Admission: RE | Admit: 2018-10-28 | Discharge: 2018-10-28 | Disposition: A | Discharge status: Home / Self Care | Payer: Medicare Other | Source: Ambulatory Visit | Attending: Cardiology | Admitting: Cardiology

## 2018-10-28 DIAGNOSIS — M7989 Other specified soft tissue disorders: Secondary | ICD-10-CM | POA: Diagnosis not present

## 2018-10-28 DIAGNOSIS — M79604 Pain in right leg: Secondary | ICD-10-CM | POA: Diagnosis not present

## 2018-10-28 NOTE — Progress Notes (Signed)
Right lower venous has been completed and is negative for DVT. Preliminary results can be found under CV proc through chart review.  Nawal Burling RVT [facility]  

## 2018-10-28 NOTE — Progress Notes (Signed)
Right lower venous has been completed and is negative for DVT. Preliminary results can be found under CV proc through chart review.  Artemis Koller [facility]T [facility]  

## 2018-11-02 ENCOUNTER — Telehealth (HOSPITAL_COMMUNITY): Payer: Self-pay

## 2018-11-02 NOTE — Telephone Encounter (Signed)
Patient is asking for a letter for medical necessity for her Adderall. This will lower the cost for her - right now it is 40 dollars for a 30 day suply

## 2018-11-03 DIAGNOSIS — M25561 Pain in right knee: Secondary | ICD-10-CM | POA: Diagnosis not present

## 2018-11-03 DIAGNOSIS — Z5189 Encounter for other specified aftercare: Secondary | ICD-10-CM | POA: Diagnosis not present

## 2018-11-11 NOTE — Telephone Encounter (Signed)
Ok. Did we do one last year? If so let's update it and send

## 2018-11-15 DIAGNOSIS — M25562 Pain in left knee: Secondary | ICD-10-CM | POA: Diagnosis not present

## 2018-11-15 DIAGNOSIS — Z5189 Encounter for other specified aftercare: Secondary | ICD-10-CM | POA: Diagnosis not present

## 2018-11-15 DIAGNOSIS — M25561 Pain in right knee: Secondary | ICD-10-CM | POA: Diagnosis not present

## 2018-12-01 DIAGNOSIS — M1711 Unilateral primary osteoarthritis, right knee: Secondary | ICD-10-CM | POA: Diagnosis not present

## 2018-12-01 DIAGNOSIS — M25561 Pain in right knee: Secondary | ICD-10-CM | POA: Diagnosis not present

## 2018-12-10 ENCOUNTER — Encounter (HOSPITAL_COMMUNITY): Payer: Self-pay | Admitting: Psychiatry

## 2018-12-10 ENCOUNTER — Other Ambulatory Visit: Payer: Self-pay

## 2018-12-10 ENCOUNTER — Ambulatory Visit (INDEPENDENT_AMBULATORY_CARE_PROVIDER_SITE_OTHER): Payer: Medicare Other | Admitting: Psychiatry

## 2018-12-10 VITALS — BP 118/81 | HR 80 | Temp 97.3°F

## 2018-12-10 DIAGNOSIS — F900 Attention-deficit hyperactivity disorder, predominantly inattentive type: Secondary | ICD-10-CM

## 2018-12-10 DIAGNOSIS — F411 Generalized anxiety disorder: Secondary | ICD-10-CM | POA: Diagnosis not present

## 2018-12-10 DIAGNOSIS — F3130 Bipolar disorder, current episode depressed, mild or moderate severity, unspecified: Secondary | ICD-10-CM

## 2018-12-10 MED ORDER — AMPHETAMINE-DEXTROAMPHETAMINE 30 MG PO TABS: 30.0000 mg | ORAL_TABLET | Freq: Two times a day (BID) | ORAL | 0 refills | Status: DC

## 2018-12-10 MED ORDER — QUETIAPINE FUMARATE 300 MG PO TABS: 300.0000 mg | ORAL_TABLET | Freq: Every day | ORAL | 0 refills | Status: DC

## 2018-12-10 MED ORDER — LAMOTRIGINE 100 MG PO TABS: 100.0000 mg | ORAL_TABLET | Freq: Two times a day (BID) | ORAL | 0 refills | Status: DC

## 2018-12-10 MED ORDER — SERTRALINE HCL 100 MG PO TABS: 200.0000 mg | ORAL_TABLET | Freq: Every day | ORAL | 0 refills | Status: DC

## 2018-12-10 MED ORDER — ALPRAZOLAM 1 MG PO TABS: 1.0000 mg | ORAL_TABLET | Freq: Four times a day (QID) | ORAL | 1 refills | Status: DC

## 2018-12-10 NOTE — Progress Notes (Signed)
[facility] MD/PA/NP OP Progress Note  12/10/2018 10:33 AM Melissa Ochoa  MRN:  [id number]  Chief Complaint:  Chief Complaint    Follow-up; Medication Refill     HPI: Patient is here with her husband.  She had knee surgery in December.  She reports that since then her knee pain has drastically worsened and she is having swelling.  She is unable to walk for long periods of time and the pain is almost constant.  The only time that she does not experience pain is when she is sleeping.  She has been told that she will need a total knee replacement and that will require a lot of physical therapy.  Patient is reporting worsening depression due to several family stressors and her ongoing and worsening health problems.  She is feeling somewhat hopeless.  Patient has set up appointments with 2 other orthopedic surgeons to explore options.  In the meantime the only means that she has for treating the pain is ibuprofen.  She spends a lot of time at home alone.  She finds that she is often crying and having negative thoughts related to how much she is suffering and having passive thoughts of death.  She denies any HI.  She is denying any manic or hypomanic-like symptoms.  She admits that she is very irritable and easily annoyed by others.  She does not like having to depend on her husband to go out and is annoyed by his jokes.    Visit Diagnosis:    ICD-10-CM   1. Bipolar I disorder, most recent episode depressed (HCC) F31.30 sertraline (ZOLOFT) 100 MG tablet    QUEtiapine (SEROQUEL) 300 MG tablet    lamoTRIgine (LAMICTAL) 100 MG tablet  2. Attention deficit hyperactivity disorder (ADHD), predominantly inattentive type F90.0 amphetamine-dextroamphetamine (ADDERALL) 30 MG tablet    amphetamine-dextroamphetamine (ADDERALL) 30 MG tablet    amphetamine-dextroamphetamine (ADDERALL) 30 MG tablet  3. GAD (generalized anxiety disorder) F41.1 ALPRAZolam (XANAX) 1 MG tablet     Past Psychiatric History:   Anxiety:Yes Bipolar Disorder:Yes Depression:Yes Mania:Yes Psychosis:No Schizophrenia:No Personality Disorder:No Hospitalization for psychiatric illness:No History of Electroconvulsive Shock Therapy:No Prior Suicide Attempts:No    Past Medical History:  Past Medical History:  Diagnosis Date  . ADHD (attention deficit hyperactivity disorder)   . Anxiety   . Bipolar disorder ([place])   . Cervical disc disease   . Colon polyps    adenomatous  . Depression   . Hypertension   . Iron deficiency   . Kidney disease    renal angiomyolipomas  . Liver cyst   . Melanosis   . 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 COLONOSCOPY W/ ENDOSCOPIC [place]    . KNEE ARTHROSCOPY WITH DRILLING/MICROFRACTURE Right 09/27/2018   Procedure: Right knee arthroscopy with partial lateral menisectomy;  Surgeon: Latanya Maudlin, MD;  Location: [facility];  Service: Orthopedics;  Laterality: Right;  . NEPHRECTOMY Right   . RENAL ARTERY EMBOLIZATION     2 procdures in the left , 4 in the right      Family Psychiatric History:  Family History  Problem Relation Age of Onset  . Lung cancer Father        smoker  . Anxiety disorder Mother   . ADD / ADHD Mother   . Depression Mother   . Diabetes Other        a lot of relatives  . ADD / ADHD Daughter   . Bipolar disorder  Daughter   . Suicidality Neg Hx     Social History:  Social History   Socioeconomic History  . Marital status: Married    Spouse name: Not on file  . Number of children: Not on file  . Years of education: Not on file  . Highest education level: Not on file  Occupational History  . Not on file  Social Needs  . Financial resource strain: Not on file  . Food insecurity:    Worry: Not on file    Inability: Not on file  . Transportation needs:    Medical: Not on file    Non-medical: Not on file  Tobacco Use  . Smoking status: Never Smoker  . Smokeless tobacco: Never Used   Substance and Sexual Activity  . Alcohol use: No    Alcohol/week: 0.0 standard drinks  . Drug use: No  . Sexual activity: Yes    Partners: Male    Birth control/protection: None  Lifestyle  . Physical activity:    Days per week: Not on file    Minutes per session: Not on file  . Stress: Not on file  Relationships  . Social connections:    Talks on phone: Not on file    Gets together: Not on file    Attends religious service: Not on file    Active member of club or organization: Not on file    Attends meetings of clubs or organizations: Not on file    Relationship status: Not on file  Other Topics Concern  . Not on file  Social History Narrative  . Not on file    Allergies: No Known Allergies  Metabolic Disorder Labs: No results found for: HGBA1C, MPG No results found for: PROLACTIN No results found for: CHOL, TRIG, HDL, CHOLHDL, VLDL, LDLCALC Lab Results  Component Value Date   TSH 3.15 10/27/2017    Therapeutic Level Labs: No results found for: LITHIUM No results found for: VALPROATE No components found for:  CBMZ  Current Medications: Current Outpatient Medications  Medication Sig Dispense Refill  . ALPRAZolam (XANAX) 1 MG tablet Take 1 tablet (1 mg total) by mouth 4 (four) times daily. 120 tablet 1  . amphetamine-dextroamphetamine (ADDERALL) 30 MG tablet Take 1 tablet by mouth 2 (two) times daily. 60 tablet 0  . amphetamine-dextroamphetamine (ADDERALL) 30 MG tablet Take 1 tablet by mouth 2 (two) times daily. 60 tablet 0  . amphetamine-dextroamphetamine (ADDERALL) 30 MG tablet Take 1 tablet by mouth 2 (two) times daily. 60 tablet 0  . labetalol (NORMODYNE) 200 MG tablet Take 200 mg by mouth 3 (three) times daily.    Marland Kitchen lamoTRIgine (LAMICTAL) 100 MG tablet Take 1 tablet (100 mg total) by mouth 2 (two) times daily. 180 tablet 0  . losartan (COZAAR) 50 MG tablet Take 50 mg by mouth daily.     . QUEtiapine (SEROQUEL) 300 MG tablet Take 1 tablet (300 mg total) by  mouth at bedtime. 90 tablet 0  . sertraline (ZOLOFT) 100 MG tablet Take 2 tablets (200 mg total) by mouth daily. 180 tablet 0   No current facility-administered medications for this visit.      Musculoskeletal: Strength & Muscle Tone: decreased Gait & Station: In a wheelchair.  Unable to walk for long periods of time Patient leans: N/A   Psychiatric Specialty Exam: Review of Systems  Constitutional: Negative for chills, fever and malaise/fatigue.  Musculoskeletal: Positive for back pain, joint pain, myalgias and neck pain.    Blood pressure  118/81, pulse 80, temperature (!) 97.3 F (36.3 C), temperature source Oral.There is no height or weight on file to calculate BMI.  General Appearance: Fairly Groomed  Eye Contact:  Good  Speech:  Clear and Coherent and Normal Rate  Volume:  Normal  Mood:  Depressed  Affect:  Congruent and Depressed  Thought Process:  Goal Directed and Descriptions of Associations: Intact  Orientation:  Full (Time, Place, and Person)  Thought Content:  Rumination  Suicidal Thoughts:  denies SI but does report worsening passive thoughts of death  Homicidal Thoughts:  No  Memory:  Immediate;   Fair  Judgement:  Fair  Insight:  Present  Psychomotor Activity:  Normal  Concentration:  Concentration: Good  Recall:  Good  Fund of Knowledge:  Good  Language:  Good  Akathisia:  No  Handed:  Right  AIMS (if indicated):     Assets:  Communication Skills Desire for Improvement Financial Resources/Insurance Housing Intimacy Leisure Time Resilience Social Support Talents/Skills  ADL's:  Intact  Cognition:  WNL  Sleep:   good        Screenings: PHQ2-9     Office Visit from 04/17/2014 in [facility]  PHQ-2 Total Score  0      I reviewed the information below on 12/10/2018 and have updated it Assessment and Plan: Bipolar disorder; GAD; ADHD-inattentive type; rule out OCD    Medication management with  supportive therapy. Risks and benefits, side effects and alternative treatment options discussed with patient. Pt was given an opportunity to ask questions about medication, illness, and treatment. All current psychiatric medications have been reviewed and discussed with the patient and adjusted as clinically appropriate. The patient has been provided an accurate and updated list of the medications being now prescribed. Patient expressed understanding of how their medications were to be used.  Pt verbalized understanding and verbal consent obtained for treatment.   Status of current problems: Depression continues to worsen  Meds: Zoloft 200 mg p.o. daily for depression and GAD Seroquel 300 mg p.o. nightly for bipolar disorder Lamictal 100 mg p.o. twice daily for bipolar disorder Adderall 30 mg p.o. twice daily for ADHD Xanax 1 mg p.o. 4 times daily as needed anxiety May consider Effexor XR in the future Patient does not want meds changed today.  "I will be all right"  Labs: none  Therapy: brief supportive therapy provided. Discussed psychosocial stressors in detail.    Consultations: Encouraged to follow up with PCP as needed Due to her current physical limitations patient is unable to travel on her own.  I recommended she engage in therapy and gave her resources for telehealth.org as well as a [email or web address] to research online therapy  Pt denies SI but does report passive thoughts of death that are occurring more frequently.  She is at an acute low risk for suicide as she wants to live for her children and granddaughter.. Patient told to call clinic if any problems occur. Patient advised to go to ER if they should develop SI/HI, side effects, or if symptoms worsen. Has crisis numbers to call if needed. Pt verbalized understanding.  F/up in 2 months or sooner if needed     Charlcie Cradle, MD 12/10/2018, 10:33 AM

## 2018-12-15 DIAGNOSIS — M1711 Unilateral primary osteoarthritis, right knee: Secondary | ICD-10-CM | POA: Diagnosis not present

## 2019-01-06 ENCOUNTER — Other Ambulatory Visit (HOSPITAL_COMMUNITY): Payer: Self-pay | Admitting: Psychiatry

## 2019-01-06 DIAGNOSIS — F3130 Bipolar disorder, current episode depressed, mild or moderate severity, unspecified: Secondary | ICD-10-CM

## 2019-01-12 DIAGNOSIS — M1711 Unilateral primary osteoarthritis, right knee: Secondary | ICD-10-CM | POA: Diagnosis not present

## 2019-01-19 DIAGNOSIS — M1711 Unilateral primary osteoarthritis, right knee: Secondary | ICD-10-CM | POA: Diagnosis not present

## 2019-01-26 DIAGNOSIS — M1711 Unilateral primary osteoarthritis, right knee: Secondary | ICD-10-CM | POA: Diagnosis not present

## 2019-02-17 ENCOUNTER — Other Ambulatory Visit (HOSPITAL_COMMUNITY): Payer: Self-pay | Admitting: Psychiatry

## 2019-02-17 DIAGNOSIS — F3130 Bipolar disorder, current episode depressed, mild or moderate severity, unspecified: Secondary | ICD-10-CM

## 2019-02-23 DIAGNOSIS — M1711 Unilateral primary osteoarthritis, right knee: Secondary | ICD-10-CM | POA: Diagnosis not present

## 2019-03-02 DIAGNOSIS — M1711 Unilateral primary osteoarthritis, right knee: Secondary | ICD-10-CM | POA: Diagnosis not present

## 2019-03-08 ENCOUNTER — Other Ambulatory Visit (HOSPITAL_COMMUNITY): Payer: Self-pay | Admitting: Psychiatry

## 2019-03-08 DIAGNOSIS — F3130 Bipolar disorder, current episode depressed, mild or moderate severity, unspecified: Secondary | ICD-10-CM

## 2019-03-08 NOTE — H&P (Signed)
TOTAL KNEE ADMISSION H&P  Patient is being admitted for right total knee arthroplasty.  Subjective:  Chief Complaint:right knee pain.  HPI: Melissa Ochoa, 53 y.o. female, has a history of pain and functional disability in the right knee due to arthritis and has failed non-surgical conservative treatments for greater than 12 weeks to includecorticosteriod injections, viscosupplementation injections and activity modification.  Onset of symptoms was gradual, starting 1 years ago with gradually worsening course since that time. The patient noted prior procedures on the knee to include  arthroscopy and menisectomy on the right knee(s).  Patient currently rates pain in the right knee(s) at 10 out of 10 with activity. Patient has night pain, worsening of pain with activity and weight bearing and joint swelling.  Patient has evidence of bone-on-bone osteoarthritis within the lateral and patellofemoral compartments by imaging studies. There is no active infection.  Patient Active Problem List   Diagnosis Date Noted   Aftercare 09/29/2018   Pain in right knee 08/03/2018   Diarrhea 10/27/2017   Bipolar affective disorder, currently depressed, moderate ([place]) 05/17/2014   GAD (generalized anxiety disorder) 05/17/2014   ADD (attention deficit disorder) 05/17/2014   Aphthous Ulcer 04/19/2014   Past Medical History:  Diagnosis Date   ADHD (attention deficit hyperactivity disorder)    Anxiety    Bipolar disorder (HCC)    Cervical disc disease    Colon polyps    adenomatous   Depression    Hypertension    Iron deficiency    Kidney disease    renal angiomyolipomas   Liver cyst    Melanosis    Tuberous sclerosis ([place])     Past Surgical History:  Procedure Laterality Date   COLONOSCOPY W/ ENDOSCOPIC [place]     KNEE ARTHROSCOPY WITH DRILLING/MICROFRACTURE Right 09/27/2018   Procedure: Right knee arthroscopy with partial lateral menisectomy;  Surgeon: Latanya Maudlin, MD;   Location: [facility];  Service: Orthopedics;  Laterality: Right;   NEPHRECTOMY Right    RENAL ARTERY EMBOLIZATION     2 procdures in the left , 4 in the right      No current facility-administered medications for this encounter.    Current Outpatient Medications  Medication Sig Dispense Refill Last Dose   ALPRAZolam (XANAX) 1 MG tablet Take 1 tablet (1 mg total) by mouth 4 (four) times daily. 120 tablet 1    amphetamine-dextroamphetamine (ADDERALL) 30 MG tablet Take 1 tablet by mouth 2 (two) times daily. 60 tablet 0    amphetamine-dextroamphetamine (ADDERALL) 30 MG tablet Take 1 tablet by mouth 2 (two) times daily. 60 tablet 0    amphetamine-dextroamphetamine (ADDERALL) 30 MG tablet Take 1 tablet by mouth 2 (two) times daily. 60 tablet 0    labetalol (NORMODYNE) 200 MG tablet Take 200 mg by mouth 3 (three) times daily.   Taking   lamoTRIgine (LAMICTAL) 100 MG tablet Take 1 tablet (100 mg total) by mouth 2 (two) times daily. 180 tablet 0    losartan (COZAAR) 50 MG tablet Take 50 mg by mouth daily.    Taking   QUEtiapine (SEROQUEL) 300 MG tablet Take 1 tablet (300 mg total) by mouth at bedtime. 90 tablet 0    sertraline (ZOLOFT) 100 MG tablet TAKE 2 TABLETS(200 MG) BY MOUTH DAILY 180 tablet 0    No Known Allergies  Social History   Tobacco Use   Smoking status: Never Smoker   Smokeless tobacco: Never Used  Substance Use Topics   Alcohol use: No    Alcohol/week:  0.0 standard drinks    Family History  Problem Relation Age of Onset   Lung cancer Father        smoker   Anxiety disorder Mother    ADD / ADHD Mother    Depression Mother    Diabetes Other        a lot of relatives   ADD / ADHD Daughter    Bipolar disorder Daughter    Suicidality Neg Hx      Review of Systems  Constitutional: Negative for chills and fever.  HENT: Negative for congestion, sore throat and tinnitus.   Eyes: Negative for double vision, photophobia and pain.  Respiratory: Negative for  cough, shortness of breath and wheezing.   Cardiovascular: Negative for chest pain, palpitations and orthopnea.  Gastrointestinal: Negative for heartburn, nausea and vomiting.  Genitourinary: Negative for dysuria, frequency and urgency.  Musculoskeletal: Positive for joint pain.  Neurological: Negative for dizziness, weakness and headaches.    Objective:  Physical Exam  Well nourished and well developed.  General: Alert and oriented x3, cooperative and pleasant, no acute distress.  Head: normocephalic, atraumatic, neck supple.  Eyes: EOMI.  Respiratory: breath sounds clear in all fields, no wheezing, rales, or rhonchi. Cardiovascular: Regular rate and rhythm. Systolic murmur noted. Abdomen: non-tender to palpation and soft, normoactive bowel sounds. Musculoskeletal:  Right Knee Exam:  Small intra-articular effusion present. Moderate, extra-articular swelling present. The range of motion is 20 - 80 degrees, limited by severe pain.  No crepitus on range of motion of the knee.  Positive medial joint line tenderness Positive lateral joint line tenderness.  The knee is stable.  Calves soft and nontender. Motor function intact in LE. Strength 5/5 LE bilaterally. Neuro: Distal pulses 2+. Sensation to light touch intact in LE.  Vital signs in last 24 hours: Blood pressure: 132/70 mmHg  Labs:   Estimated body mass index is 31.01 kg/m as calculated from the following:   Height as of 10/08/18: 5\' 9"  (1.753 m).   Weight as of 10/08/18: 95.3 kg.   Imaging Review Plain radiographs demonstrate severe degenerative joint disease of the right knee(s). The overall alignment isneutral. The bone quality appears to be adequate for age and reported activity level.   Assessment/Plan:  End stage arthritis, right knee   The patient history, physical examination, clinical judgment of the provider and imaging studies are consistent with end stage degenerative joint disease of the right knee(s)  and total knee arthroplasty is deemed medically necessary. The treatment options including medical management, injection therapy arthroscopy and arthroplasty were discussed at length. The risks and benefits of total knee arthroplasty were presented and reviewed. The risks due to aseptic loosening, infection, stiffness, patella tracking problems, thromboembolic complications and other imponderables were discussed. The patient acknowledged the explanation, agreed to proceed with the plan and consent was signed. Patient is being admitted for inpatient treatment for surgery, pain control, PT, OT, prophylactic antibiotics, VTE prophylaxis, progressive ambulation and ADL's and discharge planning. The patient is planning to be discharged home.  Anticipated LOS equal to or greater than 2 midnights due to - Age 23 and older with one or more of the following:  - Obesity  - Expected need for hospital services (PT, OT, Nursing) required for safe  discharge  - Anticipated need for postoperative skilled nursing care or inpatient rehab  - Active co-morbidities: None OR   - Unanticipated findings during/Post Surgery: None  - Patient is a high risk of re-admission due to: None  Therapy Plans: outpatient therapy at [facility] Disposition: Home with husband Planned DVT Prophylaxis: Aspirin 325 mg BID DME needed: None PCP: Marton Redwood, MD Nephrologist: Donato Heinz, MD TXA: IV Allergies: NKDA Anesthesia Concerns: None BMI: 30.9  - Patient was instructed on what medications to stop prior to surgery. - Follow-up visit in 2 weeks with Dr. Wynelle Link - Begin physical therapy following surgery - Pre-operative lab work as pre-surgical testing - Prescriptions will be provided in hospital at time of discharge  Theresa Duty, PA-C Orthopedic Surgery [facility] [place]

## 2019-03-15 DIAGNOSIS — Z905 Acquired absence of kidney: Secondary | ICD-10-CM | POA: Diagnosis not present

## 2019-03-15 DIAGNOSIS — I129 Hypertensive chronic kidney disease with stage 1 through stage 4 chronic kidney disease, or unspecified chronic kidney disease: Secondary | ICD-10-CM | POA: Diagnosis not present

## 2019-03-15 DIAGNOSIS — D631 Anemia in chronic kidney disease: Secondary | ICD-10-CM | POA: Diagnosis not present

## 2019-03-15 DIAGNOSIS — N182 Chronic kidney disease, stage 2 (mild): Secondary | ICD-10-CM | POA: Diagnosis not present

## 2019-03-18 ENCOUNTER — Ambulatory Visit (HOSPITAL_COMMUNITY): Payer: Medicare Other | Admitting: Psychiatry

## 2019-03-22 NOTE — Progress Notes (Signed)
09/23/2018- noted in Epic- EKG

## 2019-03-22 NOTE — Patient Instructions (Addendum)
Melissa Ochoa   Your procedure is scheduled on: Monday 03/27/2019    Report to [facility] Main  Entrance              Report to admitting at  2:00 PM                [place] [facility] TEST ON__6/25_____ @_______ , THIS TEST MUST BE DONE BEFORE SURGERY, COME TO [place].              ONCE YOUR COVID TEST IS COMPLETED, PLEASE BEGIN THE QUARANTINE INSTRUCTIONS AS OUTLINED IN YOUR HANDOUT.   Call this number if you have problems the morning of surgery [phone]                    NO SOLID FOOD AFTER MIDNIGHT THE NIGHT PRIOR TO SURGERY . NOTHING BY MOUTH EXCEPT CLEAR LIQUIDS UNTIL 1:30pm PRIOR TO [place].             PLEASE FINISH ENSURE DRINK PER SURGEON ORDER 3 HOURS PRIOR TO SCHEDULED SURGERY TIME WHICH NEEDS TO BE COMPLETED AT _1:30 pm.   CLEAR LIQUID DIET   Foods Allowed                                                                     Foods Excluded  Coffee and tea, regular and decaf                             liquids that you cannot  Plain Jell-O in any flavor                                             see through such as: Fruit ices (not with fruit pulp)                                     milk, soups, orange juice  Iced Popsicles                                    All solid food Carbonated beverages, regular and diet                                    Cranberry, grape and apple juices Sports drinks like Gatorade Lightly seasoned clear broth or consume(fat free) Sugar, honey syrup  Sample Menu Breakfast                                Lunch  Cranberry juice                    Beef broth                      Jell-O                                     Grape juice                            Coffee or tea                        Jell-O                                                                                      Coffee or tea                           _____________________________________________________________________                BRUSH YOUR TEETH MORNING OF SURGERY AND RINSE YOUR MOUTH OUT, NO CHEWING GUM CANDY OR MINTS.     Take these medicines the morning of surgery with A SIP OF WATER                                                                                                                                     : Labetalol (Normodyne), Lamotrigine (Lamictal), Sertraline (Zoloft)                                 You may not have any metal on your body including hair pins and              piercings             Do not wear jewelry, make-up, lotions, powders or perfumes, deodorant             Do not wear nail polish.  Do not shave  48 hours prior to surgery.                 Do not bring valuables to the hospital. [place].  Contacts, dentures or bridgework may not be worn  into surgery.                  Please read over the following fact sheets you were given: _____________________________________________________________________             [facility] - Preparing for Surgery  Before surgery, you can play an important role.   Because skin is not sterile, your skin needs to be as free of germs as possible.   You can reduce the number of germs on your skin by washing with CHG (chlorahexidine gluconate) soap before surgery.   CHG is an antiseptic cleaner which kills germs and bonds with the skin to continue killing germs even after washing. Please DO NOT use if you have an allergy to CHG or antibacterial soaps.   If your skin becomes reddened/irritated stop using the CHG and inform your nurse when you arrive at Short Stay. Do not shave (including legs and underarms) for at least 48 hours prior to the first CHG shower.  Please follow these instructions carefully:  1.  Shower with CHG Soap the night before surgery and the  morning of Surgery.  2.  If you choose to  wash your hair, wash your hair first as usual with your  normal  shampoo.  3.  After you shampoo, rinse your hair and body thoroughly to remove the  shampoo.                                        4.  Use CHG as you would any other liquid soap.  You can apply chg directly  to the skin and wash                       Gently with a scrungie or clean washcloth.  5.  Apply the CHG Soap to your body ONLY FROM THE NECK DOWN.   Do not use on face/ open                           Wound or open sores. Avoid contact with eyes, ears mouth and genitals (private parts).                       Wash face,  Genitals (private parts) with your normal soap.             6.  Wash thoroughly, paying special attention to the area where your surgery  will be performed.  7.  Thoroughly rinse your body with warm water from the neck down.  8.  DO NOT shower/wash with your normal soap after using and rinsing off  the CHG Soap.             9.  Pat yourself dry with a clean towel.            10.  Wear clean pajamas.            11.  Place clean sheets on your bed the night of your first shower and do not  sleep with pets.  Day of Surgery : Do not apply any lotions/deodorants the morning of surgery.  Please wear clean clothes to the hospital/surgery center.   FAILURE TO FOLLOW THESE INSTRUCTIONS MAY RESULT IN THE CANCELLATION OF YOUR SURGERY PATIENT SIGNATURE_________________________________  NURSE SIGNATURE__________________________________  ________________________________________________________________________   Melissa Ochoa  An incentive spirometer is a tool that can help keep your lungs clear and active. This tool measures how well you are filling your lungs with each breath. Taking long deep breaths may help reverse or decrease the chance of developing breathing (pulmonary) problems (especially infection) following:  A long period of time when you are unable to move or be active. BEFORE THE PROCEDURE    If the spirometer includes an indicator to show your best effort, your nurse or respiratory therapist will set it to a desired goal.  If possible, sit up straight or lean slightly forward. Try not to slouch.  Hold the incentive spirometer in an upright position. INSTRUCTIONS FOR USE  1. Sit on the edge of your bed if possible, or sit up as far as you can in bed or on a chair. 2. Hold the incentive spirometer in an upright position. 3. Breathe out normally. 4. Place the mouthpiece in your mouth and seal your lips tightly around it. 5. Breathe in slowly and as deeply as possible, raising the piston or the ball toward the top of the column. 6. Hold your breath for 3-5 seconds or for as long as possible. Allow the piston or ball to fall to the bottom of the column. 7. Remove the mouthpiece from your mouth and breathe out normally. 8. Rest for a few seconds and repeat Steps 1 through 7 at least 10 times every 1-2 hours when you are awake. Take your time and take a few normal breaths between deep breaths. 9. The spirometer may include an indicator to show your best effort. Use the indicator as a goal to work toward during each repetition. 10. After each set of 10 deep breaths, practice coughing to be sure your lungs are clear. If you have an incision (the cut made at the time of surgery), support your incision when coughing by placing a pillow or rolled up towels firmly against it. Once you are able to get out of bed, walk around indoors and cough well. You may stop using the incentive spirometer when instructed by your caregiver.  RISKS AND COMPLICATIONS  Take your time so you do not get dizzy or light-headed.  If you are in pain, you may need to take or ask for pain medication before doing incentive spirometry. It is harder to take a deep breath if you are having pain. AFTER USE  Rest and breathe slowly and easily.  It can be helpful to keep track of a log of your progress. Your caregiver  can provide you with a simple table to help with this. If you are using the spirometer at home, follow these instructions: [place] IF:   You are having difficultly using the spirometer.  You have trouble using the spirometer as often as instructed.  Your pain medication is not giving enough relief while using the spirometer.  You develop fever of 100.5 F (38.1 C) or higher. SEEK IMMEDIATE MEDICAL CARE IF:   You cough up bloody sputum that had not been present before.  You develop fever of 102 F (38.9 C) or greater.  You develop worsening pain at or near the incision site. MAKE SURE YOU:   Understand these instructions.  Will watch your condition.  Will get help right away if you are not doing well or get worse. Document Released: 01/25/2007 Document Revised: 12/07/2011 Document Reviewed: 03/28/2007 Exit[employer] Patient Information 2014 [employer], [place].   ________________________________________________________________________  WHAT IS A BLOOD TRANSFUSION? Blood Transfusion Information  A transfusion is the replacement of blood or some of its parts. Blood is made up of multiple cells which provide different functions.  Red blood cells carry oxygen and are used for blood loss replacement.  White blood cells fight against infection.  Platelets control bleeding.  Plasma helps clot blood.  Other blood products are available for specialized needs, such as hemophilia or other clotting disorders. BEFORE THE TRANSFUSION  Who gives blood for transfusions?   Healthy volunteers who are fully evaluated to make sure their blood is safe. This is blood bank blood. Transfusion therapy is the safest it has ever been in the practice of medicine. Before blood is taken from a donor, a complete history is taken to make sure that person has no history of diseases nor engages in risky social behavior (examples are intravenous drug use or sexual activity with multiple partners). The  donor's travel history is screened to minimize risk of transmitting infections, such as malaria. The donated blood is tested for signs of infectious diseases, such as HIV and hepatitis. The blood is then tested to be sure it is compatible with you in order to minimize the chance of a transfusion reaction. If you or a relative donates blood, this is often done in anticipation of surgery and is not appropriate for emergency situations. It takes many days to process the donated blood. RISKS AND COMPLICATIONS Although transfusion therapy is very safe and saves many lives, the main dangers of transfusion include:   Getting an infectious disease.  Developing a transfusion reaction. This is an allergic reaction to something in the blood you were given. Every precaution is taken to prevent this. The decision to have a blood transfusion has been considered carefully by your caregiver before blood is given. Blood is not given unless the benefits outweigh the risks. AFTER THE TRANSFUSION  Right after receiving a blood transfusion, you will usually feel much better and more energetic. This is especially true if your red blood cells have gotten low (anemic). The transfusion raises the level of the red blood cells which carry oxygen, and this usually causes an energy increase.  The nurse administering the transfusion will monitor you carefully for complications. HOME CARE INSTRUCTIONS  No special instructions are needed after a transfusion. You may find your energy is better. Speak with your caregiver about any limitations on activity for underlying diseases you may have. SEEK MEDICAL CARE IF:   Your condition is not improving after your transfusion.  You develop redness or irritation at the intravenous (IV) site. SEEK IMMEDIATE MEDICAL CARE IF:  Any of the following symptoms occur over the next 12 hours:  Shaking chills.  You have a temperature by mouth above 102 F (38.9 C), not controlled by  medicine.  Chest, back, or muscle pain.  People around you feel you are not acting correctly or are confused.  Shortness of breath or difficulty breathing.  Dizziness and fainting.  You get a rash or develop hives.  You have a decrease in urine output.  Your urine turns a dark color or changes to pink, red, or brown. Any of the following symptoms occur over the next 10 days:  You have a temperature by mouth above 102 F (38.9 C), not controlled by medicine.  Shortness of breath.  Weakness after normal activity.  The white part of the eye turns yellow (jaundice).  You have a decrease in the amount of urine or are urinating less often.  Your urine turns a dark  color or changes to pink, red, or brown. Document Released: 09/11/2000 Document Revised: 12/07/2011 Document Reviewed: 04/30/2008 [facility] Patient Information 2014 [place], [place].  _______________________________________________________________________

## 2019-03-23 ENCOUNTER — Encounter (HOSPITAL_COMMUNITY)
Admission: RE | Admit: 2019-03-23 | Discharge: 2019-03-23 | Disposition: A | Discharge status: Home / Self Care | Payer: Medicare Other | Source: Ambulatory Visit | Attending: Orthopedic Surgery | Admitting: Orthopedic Surgery

## 2019-03-23 ENCOUNTER — Encounter (HOSPITAL_COMMUNITY): Payer: Self-pay

## 2019-03-23 ENCOUNTER — Other Ambulatory Visit: Payer: Self-pay

## 2019-03-23 ENCOUNTER — Other Ambulatory Visit (HOSPITAL_COMMUNITY)
Admission: RE | Admit: 2019-03-23 | Discharge: 2019-03-23 | Disposition: A | Discharge status: Home / Self Care | Payer: Medicare Other | Source: Ambulatory Visit | Attending: Orthopedic Surgery | Admitting: Orthopedic Surgery

## 2019-03-23 DIAGNOSIS — Z1159 Encounter for screening for other viral diseases: Secondary | ICD-10-CM | POA: Insufficient documentation

## 2019-03-23 DIAGNOSIS — Z01812 Encounter for preprocedural laboratory examination: Secondary | ICD-10-CM | POA: Diagnosis not present

## 2019-03-23 DIAGNOSIS — F909 Attention-deficit hyperactivity disorder, unspecified type: Secondary | ICD-10-CM | POA: Diagnosis not present

## 2019-03-23 DIAGNOSIS — Z79899 Other long term (current) drug therapy: Secondary | ICD-10-CM | POA: Diagnosis not present

## 2019-03-23 DIAGNOSIS — F319 Bipolar disorder, unspecified: Secondary | ICD-10-CM | POA: Insufficient documentation

## 2019-03-23 DIAGNOSIS — M1711 Unilateral primary osteoarthritis, right knee: Secondary | ICD-10-CM | POA: Diagnosis not present

## 2019-03-23 LAB — SURGICAL PCR SCREEN
MRSA, PCR: NEGATIVE
Staphylococcus aureus: POSITIVE — AB

## 2019-03-23 LAB — COMPREHENSIVE METABOLIC PANEL
ALT: 18 U/L (ref 0–44)
AST: 20 U/L (ref 15–41)
Albumin: 4.5 g/dL (ref 3.5–5.0)
Alkaline Phosphatase: 83 U/L (ref 38–126)
Anion gap: 8 (ref 5–15)
BUN: 33 mg/dL — ABNORMAL HIGH (ref 6–20)
CO2: 26 mmol/L (ref 22–32)
Calcium: 9.9 mg/dL (ref 8.9–10.3)
Chloride: 107 mmol/L (ref 98–111)
Creatinine, Ser: 0.8 mg/dL (ref 0.44–1.00)
GFR calc Af Amer: >60 mL/min (ref >60)
GFR calc non Af Amer: >60 mL/min (ref >60)
Glucose, Bld: 95 mg/dL (ref 70–99)
Potassium: 4.8 mmol/L (ref 3.5–5.1)
Sodium: 141 mmol/L (ref 135–145)
Total Bilirubin: 0.5 mg/dL (ref 0.3–1.2)
Total Protein: 7.6 g/dL (ref 6.5–8.1)

## 2019-03-23 LAB — PROTIME-INR
INR: 0.9 (ref 0.8–1.2)
Prothrombin Time: 12 seconds (ref 11.4–15.2)

## 2019-03-23 LAB — SARS CORONAVIRUS 2 (TAT 6-24 HRS): SARS Coronavirus 2: NEGATIVE

## 2019-03-23 LAB — CBC
HCT: 39.9 % (ref 36.0–46.0)
Hemoglobin: 12 g/dL (ref 12.0–15.0)
MCH: 28.3 pg (ref 26.0–34.0)
MCHC: 30.1 g/dL (ref 30.0–36.0)
MCV: 94.1 fL (ref 80.0–100.0)
Platelets: 282 10*3/uL (ref 150–400)
RBC: 4.24 MIL/uL (ref 3.87–5.11)
RDW: 14.1 % (ref 11.5–15.5)
WBC: 8.7 10*3/uL (ref 4.0–10.5)
nRBC: 0 % (ref 0.0–0.2)

## 2019-03-23 LAB — APTT: aPTT: 39 seconds — ABNORMAL HIGH (ref 24–36)

## 2019-03-23 NOTE — Progress Notes (Signed)
Konrad Felix PA   EKG in epic , labs in Epic BUN 33, APTT 39. Dr. Brigitte Pulse is her PCP

## 2019-03-24 NOTE — Progress Notes (Signed)
Left voicemail to arrive at 1:45 PM 03/27/2019.

## 2019-03-24 NOTE — Progress Notes (Signed)
Melissa Ochoa returned my call and is aware to arrive at 1345 03/27/2019.

## 2019-03-26 MED ORDER — BUPIVACAINE LIPOSOME 1.3 % IJ SUSP
20.0000 mL | INTRAMUSCULAR | Status: DC
  Filled 2019-03-26: qty 20

## 2019-03-26 NOTE — Anesthesia Preprocedure Evaluation (Addendum)
Anesthesia Evaluation  Patient identified by MRN, date of birth, ID band Patient awake    Reviewed: Allergy & Precautions, H&P , NPO status , Patient's Chart, lab work & pertinent test results  Airway Mallampati: II  TM Distance: >3 FB Neck ROM: Full    Dental no notable dental hx. (+) Teeth Intact   Pulmonary neg pulmonary ROS,    Pulmonary exam normal breath sounds clear to auscultation       Cardiovascular hypertension, Pt. on medications Normal cardiovascular exam Rhythm:Regular Rate:Normal     Neuro/Psych Seizures -,  PSYCHIATRIC DISORDERS Bipolar Disorder    GI/Hepatic negative GI ROS, Neg liver ROS,   Endo/Other  negative endocrine ROS  Renal/GU Renal disease  negative genitourinary   Musculoskeletal  (+) Arthritis ,   Abdominal   Peds  Hematology negative hematology ROS (+)   Anesthesia Other Findings   Reproductive/Obstetrics negative OB ROS                            Lab Results  Component Value Date   CREATININE 0.80 03/23/2019   BUN 33 (H) 03/23/2019   NA 141 03/23/2019   K 4.8 03/23/2019   CL 107 03/23/2019   CO2 26 03/23/2019    Lab Results  Component Value Date   WBC 8.7 03/23/2019   HGB 12.0 03/23/2019   HCT 39.9 03/23/2019   MCV 94.1 03/23/2019   PLT 282 03/23/2019    Anesthesia Physical Anesthesia Plan  ASA: III  Anesthesia Plan: Spinal   Post-op Pain Management:  Regional for Post-op pain   Induction:   PONV Risk Score and Plan: 2 and Ondansetron, Promethazine and Treatment may vary due to age or medical condition  Airway Management Planned: Natural Airway and Nasal Cannula  Additional Equipment:   Intra-op Plan:   Post-operative Plan:   Informed Consent: I have reviewed the patients History and Physical, chart, labs and discussed the procedure including the risks, benefits and alternatives for the proposed anesthesia with the patient or  authorized representative who has indicated his/her understanding and acceptance.     Dental advisory given  Plan Discussed with:   Anesthesia Plan Comments: (Spinal w R Adductor canal block)       Anesthesia Quick Evaluation

## 2019-03-27 ENCOUNTER — Encounter (HOSPITAL_COMMUNITY)
Admission: RE | Disposition: A | Discharge status: Home / Self Care | Payer: Self-pay | Source: Home / Self Care | Attending: Orthopedic Surgery

## 2019-03-27 ENCOUNTER — Encounter (HOSPITAL_COMMUNITY): Payer: Self-pay | Admitting: Emergency Medicine

## 2019-03-27 ENCOUNTER — Inpatient Hospital Stay (HOSPITAL_COMMUNITY)
Admission: RE | Admit: 2019-03-27 | Discharge: 2019-03-29 | DRG: 470 | Disposition: A | Discharge status: Home / Self Care | Payer: Medicare Other | Attending: Orthopedic Surgery | Admitting: Orthopedic Surgery

## 2019-03-27 ENCOUNTER — Inpatient Hospital Stay (HOSPITAL_COMMUNITY): Payer: Medicare Other | Admitting: Physician Assistant

## 2019-03-27 ENCOUNTER — Other Ambulatory Visit: Payer: Self-pay

## 2019-03-27 ENCOUNTER — Inpatient Hospital Stay (HOSPITAL_COMMUNITY): Payer: Medicare Other | Admitting: Anesthesiology

## 2019-03-27 DIAGNOSIS — F909 Attention-deficit hyperactivity disorder, unspecified type: Secondary | ICD-10-CM | POA: Diagnosis not present

## 2019-03-27 DIAGNOSIS — Z818 Family history of other mental and behavioral disorders: Secondary | ICD-10-CM

## 2019-03-27 DIAGNOSIS — I10 Essential (primary) hypertension: Secondary | ICD-10-CM | POA: Diagnosis present

## 2019-03-27 DIAGNOSIS — M179 Osteoarthritis of knee, unspecified: Secondary | ICD-10-CM | POA: Diagnosis present

## 2019-03-27 DIAGNOSIS — M1710 Unilateral primary osteoarthritis, unspecified knee: Secondary | ICD-10-CM | POA: Diagnosis present

## 2019-03-27 DIAGNOSIS — F411 Generalized anxiety disorder: Secondary | ICD-10-CM | POA: Diagnosis not present

## 2019-03-27 DIAGNOSIS — M25561 Pain in right knee: Secondary | ICD-10-CM | POA: Diagnosis present

## 2019-03-27 DIAGNOSIS — Z833 Family history of diabetes mellitus: Secondary | ICD-10-CM | POA: Diagnosis not present

## 2019-03-27 DIAGNOSIS — Z801 Family history of malignant neoplasm of trachea, bronchus and lung: Secondary | ICD-10-CM

## 2019-03-27 DIAGNOSIS — G8918 Other acute postprocedural pain: Secondary | ICD-10-CM | POA: Diagnosis not present

## 2019-03-27 DIAGNOSIS — F329 Major depressive disorder, single episode, unspecified: Secondary | ICD-10-CM | POA: Diagnosis not present

## 2019-03-27 DIAGNOSIS — M25761 Osteophyte, right knee: Secondary | ICD-10-CM | POA: Diagnosis present

## 2019-03-27 DIAGNOSIS — M1711 Unilateral primary osteoarthritis, right knee: Secondary | ICD-10-CM | POA: Diagnosis not present

## 2019-03-27 DIAGNOSIS — Z905 Acquired absence of kidney: Secondary | ICD-10-CM | POA: Diagnosis not present

## 2019-03-27 DIAGNOSIS — Z1159 Encounter for screening for other viral diseases: Secondary | ICD-10-CM | POA: Diagnosis not present

## 2019-03-27 DIAGNOSIS — Z79899 Other long term (current) drug therapy: Secondary | ICD-10-CM

## 2019-03-27 HISTORY — PX: TOTAL KNEE ARTHROPLASTY: SHX125

## 2019-03-27 LAB — TYPE AND SCREEN
ABO/RH(D): O POS
Antibody Screen: NEGATIVE

## 2019-03-27 SURGERY — ARTHROPLASTY, KNEE, TOTAL
Anesthesia: Spinal | Site: Knee | Laterality: Right

## 2019-03-27 MED ORDER — SODIUM CHLORIDE (PF) 0.9 % IJ SOLN
INTRAMUSCULAR | Status: AC
  Filled 2019-03-27: qty 50

## 2019-03-27 MED ORDER — CYCLOBENZAPRINE HCL 10 MG PO TABS
10.0000 mg | ORAL_TABLET | Freq: Three times a day (TID) | ORAL | Status: DC | PRN
  Administered 2019-03-27 – 2019-03-29 (×4): 10 mg via ORAL
  Filled 2019-03-27 (×4): qty 1

## 2019-03-27 MED ORDER — METOCLOPRAMIDE HCL 5 MG/ML IJ SOLN: 5.0000 mg | Freq: Three times a day (TID) | INTRAMUSCULAR | Status: DC | PRN

## 2019-03-27 MED ORDER — PROMETHAZINE HCL 25 MG/ML IJ SOLN
INTRAMUSCULAR | Status: AC
  Filled 2019-03-27: qty 1

## 2019-03-27 MED ORDER — DIPHENHYDRAMINE HCL 12.5 MG/5ML PO ELIX
12.5000 mg | ORAL_SOLUTION | ORAL | Status: DC | PRN
  Administered 2019-03-28: 04:00:00 25 mg via ORAL
  Filled 2019-03-27: qty 10

## 2019-03-27 MED ORDER — FENTANYL CITRATE (PF) 100 MCG/2ML IJ SOLN
50.0000 ug | INTRAMUSCULAR | Status: DC
  Administered 2019-03-27: 15:00:00 100 ug via INTRAVENOUS
  Filled 2019-03-27: qty 2

## 2019-03-27 MED ORDER — METHOCARBAMOL 500 MG PO TABS
500.0000 mg | ORAL_TABLET | Freq: Four times a day (QID) | ORAL | Status: DC | PRN
  Administered 2019-03-28 – 2019-03-29 (×5): 500 mg via ORAL
  Filled 2019-03-27 (×6): qty 1

## 2019-03-27 MED ORDER — METHOCARBAMOL 500 MG IVPB - SIMPLE MED
INTRAVENOUS | Status: AC
  Filled 2019-03-27: qty 50

## 2019-03-27 MED ORDER — TRANEXAMIC ACID-NACL 1000-0.7 MG/100ML-% IV SOLN
1000.0000 mg | INTRAVENOUS | Status: AC
  Administered 2019-03-27: 1000 mg via INTRAVENOUS
  Filled 2019-03-27: qty 100

## 2019-03-27 MED ORDER — PROPOFOL 10 MG/ML IV BOLUS
INTRAVENOUS | Status: AC
  Filled 2019-03-27: qty 40

## 2019-03-27 MED ORDER — POVIDONE-IODINE 10 % EX SWAB
2.0000 "application " | Freq: Once | CUTANEOUS | Status: AC
  Administered 2019-03-27: 2 via TOPICAL

## 2019-03-27 MED ORDER — DEXAMETHASONE SODIUM PHOSPHATE 10 MG/ML IJ SOLN
INTRAMUSCULAR | Status: AC
  Filled 2019-03-27: qty 1

## 2019-03-27 MED ORDER — MEPERIDINE HCL 50 MG/ML IJ SOLN: 6.2500 mg | INTRAMUSCULAR | Status: DC | PRN

## 2019-03-27 MED ORDER — HYDROCODONE-ACETAMINOPHEN 7.5-325 MG PO TABS: 1.0000 | ORAL_TABLET | Freq: Once | ORAL | Status: DC | PRN

## 2019-03-27 MED ORDER — ROPIVACAINE HCL 5 MG/ML IJ SOLN
INTRAMUSCULAR | Status: DC | PRN
  Administered 2019-03-27: 30 mL via PERINEURAL

## 2019-03-27 MED ORDER — HYDROMORPHONE HCL 1 MG/ML IJ SOLN: 0.2500 mg | INTRAMUSCULAR | Status: DC | PRN

## 2019-03-27 MED ORDER — FLEET ENEMA 7-19 GM/118ML RE ENEM: 1.0000 | ENEMA | Freq: Once | RECTAL | Status: DC | PRN

## 2019-03-27 MED ORDER — SODIUM CHLORIDE (PF) 0.9 % IJ SOLN
INTRAMUSCULAR | Status: DC | PRN
  Administered 2019-03-27: 60 mL

## 2019-03-27 MED ORDER — LABETALOL HCL 100 MG PO TABS
400.0000 mg | ORAL_TABLET | Freq: Three times a day (TID) | ORAL | Status: DC
  Administered 2019-03-27 – 2019-03-29 (×5): 400 mg via ORAL
  Filled 2019-03-27 (×5): qty 4

## 2019-03-27 MED ORDER — ACETAMINOPHEN 10 MG/ML IV SOLN: 1000.0000 mg | Freq: Once | INTRAVENOUS | Status: DC | PRN

## 2019-03-27 MED ORDER — CHLORHEXIDINE GLUCONATE 4 % EX LIQD: 60.0000 mL | Freq: Once | CUTANEOUS | Status: DC

## 2019-03-27 MED ORDER — METHOCARBAMOL 500 MG IVPB - SIMPLE MED
500.0000 mg | Freq: Four times a day (QID) | INTRAVENOUS | Status: DC | PRN
  Administered 2019-03-27: 500 mg via INTRAVENOUS
  Filled 2019-03-27: qty 50

## 2019-03-27 MED ORDER — TRANEXAMIC ACID-NACL 1000-0.7 MG/100ML-% IV SOLN
1000.0000 mg | Freq: Once | INTRAVENOUS | Status: AC
  Administered 2019-03-28: 1000 mg via INTRAVENOUS
  Filled 2019-03-27: qty 100

## 2019-03-27 MED ORDER — PHENYLEPHRINE 40 MCG/ML (10ML) SYRINGE FOR IV PUSH (FOR BLOOD PRESSURE SUPPORT)
PREFILLED_SYRINGE | INTRAVENOUS | Status: DC | PRN
  Administered 2019-03-27: 80 ug via INTRAVENOUS

## 2019-03-27 MED ORDER — MENTHOL 3 MG MT LOZG: 1.0000 | LOZENGE | OROMUCOSAL | Status: DC | PRN

## 2019-03-27 MED ORDER — ONDANSETRON HCL 4 MG/2ML IJ SOLN
INTRAMUSCULAR | Status: DC | PRN
  Administered 2019-03-27: 4 mg via INTRAVENOUS

## 2019-03-27 MED ORDER — MORPHINE SULFATE (PF) 4 MG/ML IV SOLN
INTRAVENOUS | Status: AC
  Filled 2019-03-27: qty 1

## 2019-03-27 MED ORDER — MIDAZOLAM HCL 2 MG/2ML IJ SOLN
1.0000 mg | INTRAMUSCULAR | Status: DC
  Administered 2019-03-27: 15:00:00 2 mg via INTRAVENOUS
  Filled 2019-03-27: qty 2

## 2019-03-27 MED ORDER — PHENOL 1.4 % MT LIQD
1.0000 | OROMUCOSAL | Status: DC | PRN
  Filled 2019-03-27: qty 177

## 2019-03-27 MED ORDER — GABAPENTIN 300 MG PO CAPS
300.0000 mg | ORAL_CAPSULE | Freq: Three times a day (TID) | ORAL | Status: DC
  Administered 2019-03-27 – 2019-03-29 (×5): 300 mg via ORAL
  Filled 2019-03-27 (×5): qty 1

## 2019-03-27 MED ORDER — POLYETHYLENE GLYCOL 3350 17 G PO PACK
17.0000 g | PACK | Freq: Every day | ORAL | Status: DC | PRN
  Administered 2019-03-28: 17 g via ORAL
  Filled 2019-03-27: qty 1

## 2019-03-27 MED ORDER — BUPIVACAINE LIPOSOME 1.3 % IJ SUSP
INTRAMUSCULAR | Status: DC | PRN
  Administered 2019-03-27: 20 mL

## 2019-03-27 MED ORDER — SODIUM CHLORIDE (PF) 0.9 % IJ SOLN
INTRAMUSCULAR | Status: AC
  Filled 2019-03-27: qty 10

## 2019-03-27 MED ORDER — MORPHINE SULFATE (PF) 4 MG/ML IV SOLN
1.0000 mg | INTRAVENOUS | Status: DC | PRN
  Administered 2019-03-27 – 2019-03-28 (×2): 2 mg via INTRAVENOUS
  Filled 2019-03-27: qty 1

## 2019-03-27 MED ORDER — ACETAMINOPHEN 10 MG/ML IV SOLN
1000.0000 mg | Freq: Four times a day (QID) | INTRAVENOUS | Status: DC
  Administered 2019-03-27: 1000 mg via INTRAVENOUS
  Filled 2019-03-27: qty 100

## 2019-03-27 MED ORDER — OXYCODONE HCL 5 MG PO TABS
5.0000 mg | ORAL_TABLET | ORAL | Status: DC | PRN
  Administered 2019-03-27 – 2019-03-29 (×8): 10 mg via ORAL
  Filled 2019-03-27 (×9): qty 2

## 2019-03-27 MED ORDER — PROPOFOL 10 MG/ML IV BOLUS
INTRAVENOUS | Status: AC
  Filled 2019-03-27: qty 80

## 2019-03-27 MED ORDER — SERTRALINE HCL 100 MG PO TABS
100.0000 mg | ORAL_TABLET | Freq: Two times a day (BID) | ORAL | Status: DC
  Administered 2019-03-27 – 2019-03-29 (×4): 100 mg via ORAL
  Filled 2019-03-27 (×4): qty 1

## 2019-03-27 MED ORDER — PROPOFOL 500 MG/50ML IV EMUL
INTRAVENOUS | Status: DC | PRN
  Administered 2019-03-27: 75 ug/kg/min via INTRAVENOUS

## 2019-03-27 MED ORDER — LOSARTAN POTASSIUM 50 MG PO TABS
50.0000 mg | ORAL_TABLET | Freq: Every day | ORAL | Status: DC
  Administered 2019-03-27 – 2019-03-29 (×3): 50 mg via ORAL
  Filled 2019-03-27 (×3): qty 1

## 2019-03-27 MED ORDER — ALPRAZOLAM 1 MG PO TABS
1.0000 mg | ORAL_TABLET | Freq: Four times a day (QID) | ORAL | Status: DC | PRN
  Administered 2019-03-28 (×2): 1 mg via ORAL
  Filled 2019-03-27 (×3): qty 1

## 2019-03-27 MED ORDER — DEXAMETHASONE SODIUM PHOSPHATE 10 MG/ML IJ SOLN
10.0000 mg | Freq: Once | INTRAMUSCULAR | Status: AC
  Administered 2019-03-28: 10 mg via INTRAVENOUS
  Filled 2019-03-27: qty 1

## 2019-03-27 MED ORDER — TRAMADOL HCL 50 MG PO TABS
50.0000 mg | ORAL_TABLET | Freq: Four times a day (QID) | ORAL | Status: DC | PRN
  Administered 2019-03-28: 50 mg via ORAL
  Administered 2019-03-28: 100 mg via ORAL
  Administered 2019-03-28: 12:00:00 50 mg via ORAL
  Administered 2019-03-29: 100 mg via ORAL
  Filled 2019-03-27: qty 2
  Filled 2019-03-27 (×3): qty 1
  Filled 2019-03-27: qty 2

## 2019-03-27 MED ORDER — ASPIRIN EC 325 MG PO TBEC
325.0000 mg | DELAYED_RELEASE_TABLET | Freq: Two times a day (BID) | ORAL | Status: DC
  Administered 2019-03-28 – 2019-03-29 (×3): 325 mg via ORAL
  Filled 2019-03-27 (×3): qty 1

## 2019-03-27 MED ORDER — PROMETHAZINE HCL 25 MG/ML IJ SOLN: 6.2500 mg | INTRAMUSCULAR | Status: DC | PRN

## 2019-03-27 MED ORDER — CEFAZOLIN SODIUM-DEXTROSE 2-4 GM/100ML-% IV SOLN
2.0000 g | Freq: Four times a day (QID) | INTRAVENOUS | Status: AC
  Administered 2019-03-27 – 2019-03-28 (×2): 2 g via INTRAVENOUS
  Filled 2019-03-27 (×2): qty 100

## 2019-03-27 MED ORDER — ONDANSETRON HCL 4 MG PO TABS: 4.0000 mg | ORAL_TABLET | Freq: Four times a day (QID) | ORAL | Status: DC | PRN

## 2019-03-27 MED ORDER — BISACODYL 10 MG RE SUPP: 10.0000 mg | Freq: Every day | RECTAL | Status: DC | PRN

## 2019-03-27 MED ORDER — STERILE WATER FOR IRRIGATION IR SOLN
Status: DC | PRN
  Administered 2019-03-27: 2000 mL

## 2019-03-27 MED ORDER — SODIUM CHLORIDE 0.9 % IV SOLN
INTRAVENOUS | Status: DC
  Administered 2019-03-27: 23:00:00 via INTRAVENOUS

## 2019-03-27 MED ORDER — METOCLOPRAMIDE HCL 5 MG PO TABS: 5.0000 mg | ORAL_TABLET | Freq: Three times a day (TID) | ORAL | Status: DC | PRN

## 2019-03-27 MED ORDER — 0.9 % SODIUM CHLORIDE (POUR BTL) OPTIME
TOPICAL | Status: DC | PRN
  Administered 2019-03-27: 1000 mL

## 2019-03-27 MED ORDER — ONDANSETRON HCL 4 MG/2ML IJ SOLN: 4.0000 mg | Freq: Four times a day (QID) | INTRAMUSCULAR | Status: DC | PRN

## 2019-03-27 MED ORDER — LACTATED RINGERS IV SOLN
INTRAVENOUS | Status: DC
  Administered 2019-03-27 (×2): via INTRAVENOUS

## 2019-03-27 MED ORDER — DOCUSATE SODIUM 100 MG PO CAPS
100.0000 mg | ORAL_CAPSULE | Freq: Two times a day (BID) | ORAL | Status: DC
  Administered 2019-03-27 – 2019-03-28 (×3): 100 mg via ORAL
  Filled 2019-03-27 (×4): qty 1

## 2019-03-27 MED ORDER — SODIUM CHLORIDE 0.9 % IR SOLN
Status: DC | PRN
  Administered 2019-03-27: 1000 mL

## 2019-03-27 MED ORDER — CLONIDINE HCL (ANALGESIA) 100 MCG/ML EP SOLN
EPIDURAL | Status: DC | PRN
  Administered 2019-03-27: 100 ug

## 2019-03-27 MED ORDER — AMPHETAMINE-DEXTROAMPHETAMINE 10 MG PO TABS: 30.0000 mg | ORAL_TABLET | Freq: Two times a day (BID) | ORAL | Status: DC | PRN

## 2019-03-27 MED ORDER — CEFAZOLIN SODIUM-DEXTROSE 2-4 GM/100ML-% IV SOLN
2.0000 g | INTRAVENOUS | Status: AC
  Administered 2019-03-27: 2 g via INTRAVENOUS
  Filled 2019-03-27: qty 100

## 2019-03-27 MED ORDER — ONDANSETRON HCL 4 MG/2ML IJ SOLN
INTRAMUSCULAR | Status: AC
  Filled 2019-03-27: qty 2

## 2019-03-27 MED ORDER — LAMOTRIGINE 100 MG PO TABS
100.0000 mg | ORAL_TABLET | Freq: Two times a day (BID) | ORAL | Status: DC
  Administered 2019-03-27 – 2019-03-29 (×4): 100 mg via ORAL
  Filled 2019-03-27 (×4): qty 1

## 2019-03-27 MED ORDER — BUPIVACAINE IN DEXTROSE 0.75-8.25 % IT SOLN
INTRATHECAL | Status: DC | PRN
  Administered 2019-03-27: 1.6 mL via INTRATHECAL

## 2019-03-27 MED ORDER — DEXAMETHASONE SODIUM PHOSPHATE 10 MG/ML IJ SOLN
8.0000 mg | Freq: Once | INTRAMUSCULAR | Status: AC
  Administered 2019-03-27: 8 mg via INTRAVENOUS

## 2019-03-27 MED ORDER — PROPOFOL 10 MG/ML IV BOLUS
INTRAVENOUS | Status: DC | PRN
  Administered 2019-03-27: 20 mg via INTRAVENOUS
  Administered 2019-03-27: 30 mg via INTRAVENOUS

## 2019-03-27 MED ORDER — QUETIAPINE FUMARATE 50 MG PO TABS
300.0000 mg | ORAL_TABLET | Freq: Every day | ORAL | Status: DC
  Filled 2019-03-27: qty 6

## 2019-03-27 MED ORDER — ACETAMINOPHEN 500 MG PO TABS
1000.0000 mg | ORAL_TABLET | Freq: Four times a day (QID) | ORAL | Status: AC
  Administered 2019-03-27 – 2019-03-28 (×4): 1000 mg via ORAL
  Filled 2019-03-27 (×4): qty 2

## 2019-03-27 SURGICAL SUPPLY — 58 items
ATTUNE PS FEM RT SZ 6 CEM KNEE (Femur) ×1 IMPLANT
ATTUNE PSRP INSR SZ6 8 KNEE (Insert) ×1 IMPLANT
BAG SPEC THK2 15X12 ZIP CLS (MISCELLANEOUS) ×1
BAG ZIPLOCK 12X15 (MISCELLANEOUS) ×2 IMPLANT
BANDAGE ACE 6X5 VEL STRL LF (GAUZE/BANDAGES/DRESSINGS) ×3 IMPLANT
BASE TIBIAL ROT PLAT SZ 5 KNEE (Knees) IMPLANT
BLADE SAG 18X100X1.27 (BLADE) ×2 IMPLANT
BLADE SAW SGTL 11.0X1.19X90.0M (BLADE) ×2 IMPLANT
BLADE SURG SZ10 CARB STEEL (BLADE) ×4 IMPLANT
BOWL SMART MIX CTS (DISPOSABLE) ×2 IMPLANT
BSPLAT TIB 5 CMNT ROT PLAT STR (Knees) ×1 IMPLANT
CEMENT HV SMART SET (Cement) ×4 IMPLANT
COVER SURGICAL LIGHT HANDLE (MISCELLANEOUS) ×2 IMPLANT
COVER WAND RF STERILE (DRAPES) IMPLANT
CUFF TOURN SGL QUICK 34 (TOURNIQUET CUFF) ×2
CUFF TRNQT CYL 34X4.125X (TOURNIQUET CUFF) ×1 IMPLANT
DECANTER SPIKE VIAL GLASS SM (MISCELLANEOUS) ×2 IMPLANT
DRAPE U-SHAPE 47X51 STRL (DRAPES) ×2 IMPLANT
DRSG ADAPTIC 3X8 NADH LF (GAUZE/BANDAGES/DRESSINGS) ×2 IMPLANT
DRSG PAD ABDOMINAL 8X10 ST (GAUZE/BANDAGES/DRESSINGS) ×2 IMPLANT
DURAPREP 26ML APPLICATOR (WOUND CARE) ×2 IMPLANT
ELECT REM PT RETURN 15FT ADLT (MISCELLANEOUS) ×2 IMPLANT
EVACUATOR 1/8 PVC DRAIN (DRAIN) ×2 IMPLANT
GAUZE SPONGE 4X4 12PLY STRL (GAUZE/BANDAGES/DRESSINGS) ×2 IMPLANT
GLOVE BIO SURGEON STRL SZ7 (GLOVE) ×2 IMPLANT
GLOVE BIO SURGEON STRL SZ8 (GLOVE) ×2 IMPLANT
GLOVE BIOGEL PI IND STRL 6.5 (GLOVE) ×1 IMPLANT
GLOVE BIOGEL PI IND STRL 7.0 (GLOVE) ×1 IMPLANT
GLOVE BIOGEL PI IND STRL 8 (GLOVE) ×1 IMPLANT
GLOVE BIOGEL PI INDICATOR 6.5 (GLOVE) ×1
GLOVE BIOGEL PI INDICATOR 7.0 (GLOVE) ×1
GLOVE BIOGEL PI INDICATOR 8 (GLOVE) ×1
GLOVE SURG SS PI 6.5 STRL IVOR (GLOVE) ×2 IMPLANT
GOWN STRL REUS W/TWL LRG LVL3 (GOWN DISPOSABLE) ×6 IMPLANT
HANDPIECE INTERPULSE COAX TIP (DISPOSABLE) ×2
HOLDER FOLEY CATH W/STRAP (MISCELLANEOUS) IMPLANT
IMMOBILIZER KNEE 20 (SOFTGOODS) ×3 IMPLANT
IMMOBILIZER KNEE 20 THIGH 36 (SOFTGOODS) ×1 IMPLANT
KIT TURNOVER KIT A (KITS) IMPLANT
MANIFOLD NEPTUNE II (INSTRUMENTS) ×2 IMPLANT
NS IRRIG 1000ML POUR BTL (IV SOLUTION) ×2 IMPLANT
PACK TOTAL KNEE CUSTOM (KITS) ×2 IMPLANT
PADDING CAST COTTON 6X4 STRL (CAST SUPPLIES) ×5 IMPLANT
PATELLA MEDIAL ATTUN 35MM KNEE (Knees) ×1 IMPLANT
PIN STEINMAN FIXATION KNEE (PIN) ×1 IMPLANT
PROTECTOR NERVE ULNAR (MISCELLANEOUS) ×2 IMPLANT
SET HNDPC FAN SPRY TIP SCT (DISPOSABLE) ×1 IMPLANT
STRIP CLOSURE SKIN 1/2X4 (GAUZE/BANDAGES/DRESSINGS) ×4 IMPLANT
SUT MNCRL AB 4-0 PS2 18 (SUTURE) ×2 IMPLANT
SUT STRATAFIX 0 PDS 27 VIOLET (SUTURE) ×2
SUT VIC AB 2-0 CT1 27 (SUTURE) ×6
SUT VIC AB 2-0 CT1 TAPERPNT 27 (SUTURE) ×3 IMPLANT
SUTURE STRATFX 0 PDS 27 VIOLET (SUTURE) ×1 IMPLANT
TIBIAL BASE ROT PLAT SZ 5 KNEE (Knees) ×2 IMPLANT
TRAY FOLEY MTR SLVR 16FR STAT (SET/KITS/TRAYS/PACK) ×2 IMPLANT
WATER STERILE IRR 1000ML POUR (IV SOLUTION) ×4 IMPLANT
WRAP KNEE MAXI GEL POST OP (GAUZE/BANDAGES/DRESSINGS) ×2 IMPLANT
YANKAUER SUCT BULB TIP 10FT TU (MISCELLANEOUS) ×2 IMPLANT

## 2019-03-27 NOTE — Discharge Instructions (Signed)
° °Dr. Frank Aluisio °Total Joint Specialist °[facility] °[address] °[place], [place] [address] °[phone] ° °TOTAL KNEE REPLACEMENT POSTOPERATIVE DIRECTIONS ° °Knee Rehabilitation, Guidelines Following Surgery  °Results after knee surgery are often greatly improved when you follow the exercise, range of motion and muscle strengthening exercises prescribed by your doctor. Safety measures are also important to protect the knee from further injury. Any time any of these exercises cause you to have increased pain or swelling in your knee joint, decrease the amount until you are comfortable again and slowly increase them. If you have problems or questions, call your caregiver or physical therapist for advice.  ° °HOME CARE INSTRUCTIONS  °• Remove items at home which could result in a fall. This includes throw rugs or furniture in walking pathways.  °· ICE to the affected knee every three hours for 30 minutes at a time and then as needed for pain and swelling.  Continue to use ice on the knee for pain and swelling from surgery. You may notice swelling that will progress down to the foot and ankle.  This is normal after surgery.  Elevate the leg when you are not up walking on it.   °· Continue to use the breathing machine which will help keep your temperature down.  It is common for your temperature to cycle up and down following surgery, especially at night when you are not up moving around and exerting yourself.  The breathing machine keeps your lungs expanded and your temperature down. °· Do not place pillow under knee, focus on keeping the knee straight while resting ° °DIET °You may resume your previous home diet once your are discharged from the hospital. ° °DRESSING / WOUND CARE / SHOWERING °You may shower 3 days after surgery, but keep the wounds dry during showering.  You may use an occlusive plastic wrap (Press'n Seal for example), NO SOAKING/SUBMERGING IN THE BATHTUB.  If the bandage  gets wet, change with a clean dry gauze.  If the incision gets wet, pat the wound dry with a clean towel. °You may start showering once you are discharged home but do not submerge the incision under water. Just pat the incision dry and apply a dry gauze dressing on daily. °Change the surgical dressing daily and reapply a dry dressing each time. ° °ACTIVITY °Walk with your walker as instructed. °Use walker as long as suggested by your caregivers. °Avoid periods of inactivity such as sitting longer than an hour when not asleep. This helps prevent blood clots.  °You may resume a sexual relationship in one month or when given the OK by your doctor.  °You may return to work once you are cleared by your doctor.  °Do not drive a car for 6 weeks or until released by you surgeon.  °Do not drive while taking narcotics. ° °WEIGHT BEARING °Weight bearing as tolerated with assist device (walker, cane, etc) as directed, use it as long as suggested by your surgeon or therapist, typically at least 4-6 weeks. ° °POSTOPERATIVE CONSTIPATION PROTOCOL °Constipation - defined medically as fewer than three stools per week and severe constipation as less than one stool per week. ° °One of the most common issues patients have following surgery is constipation.  Even if you have a regular bowel pattern at home, your normal regimen is likely to be disrupted due to multiple reasons following surgery.  Combination of anesthesia, postoperative narcotics, change in appetite and fluid intake all can affect your bowels.    In order to avoid complications following surgery, here are some recommendations in order to help you during your recovery period. ° °Colace (docusate) - Pick up an over-the-counter form of Colace or another stool softener and take twice a day as long as you are requiring postoperative pain medications.  Take with a full glass of water daily.  If you experience loose stools or diarrhea, hold the colace until you stool forms back  up.  If your symptoms do not get better within 1 week or if they get worse, check with your doctor. ° °Dulcolax (bisacodyl) - Pick up over-the-counter and take as directed by the product packaging as needed to assist with the movement of your bowels.  Take with a full glass of water.  Use this product as needed if not relieved by Colace only.  ° °MiraLax (polyethylene glycol) - Pick up over-the-counter to have on hand.  MiraLax is a solution that will increase the amount of water in your bowels to assist with bowel movements.  Take as directed and can mix with a glass of water, juice, soda, coffee, or tea.  Take if you go more than two days without a movement. °Do not use MiraLax more than once per day. Call your doctor if you are still constipated or irregular after using this medication for 7 days in a row. ° °If you continue to have problems with postoperative constipation, please contact the office for further assistance and recommendations.  If you experience "the worst abdominal pain ever" or develop nausea or vomiting, please contact the office immediatly for further recommendations for treatment. ° °ITCHING ° If you experience itching with your medications, try taking only a single pain pill, or even half a pain pill at a time.  You can also use Benadryl over the counter for itching or also to help with sleep.  ° °TED HOSE STOCKINGS °Wear the elastic stockings on both legs for three weeks following surgery during the day but you may remove then at night for sleeping. ° °MEDICATIONS °See your medication summary on the “After Visit Summary” that the nursing staff will review with you prior to discharge.  You may have some home medications which will be placed on hold until you complete the course of blood thinner medication.  It is important for you to complete the blood thinner medication as prescribed by your surgeon.  Continue your approved medications as instructed at time of discharge. ° °PRECAUTIONS °If  you experience chest pain or shortness of breath - call 911 immediately for transfer to the hospital emergency department.  °If you develop a fever greater that 101 F, purulent drainage from wound, increased redness or drainage from wound, foul odor from the wound/dressing, or calf pain - CONTACT YOUR SURGEON.   °                                                °FOLLOW-UP APPOINTMENTS °Make sure you keep all of your appointments after your operation with your surgeon and caregivers. You should call the office at the above phone number and make an appointment for approximately two weeks after the date of your surgery or on the date instructed by your surgeon outlined in the "After Visit Summary". ° ° °RANGE OF MOTION AND STRENGTHENING EXERCISES  °Rehabilitation of the knee is important following a knee injury or   an operation. After just a few days of immobilization, the muscles of the thigh which control the knee become weakened and shrink (atrophy). Knee exercises are designed to build up the tone and strength of the thigh muscles and to improve knee motion. Often times heat used for twenty to thirty minutes before working out will loosen up your tissues and help with improving the range of motion but do not use heat for the first two weeks following surgery. These exercises can be done on a training (exercise) mat, on the floor, on a table or on a bed. Use what ever works the best and is most comfortable for you Knee exercises include:  °• Leg Lifts - While your knee is still immobilized in a splint or cast, you can do straight leg raises. Lift the leg to 60 degrees, hold for 3 sec, and slowly lower the leg. Repeat 10-20 times 2-3 times daily. Perform this exercise against resistance later as your knee gets better.  °• Quad and Hamstring Sets - Tighten up the muscle on the front of the thigh (Quad) and hold for 5-10 sec. Repeat this 10-20 times hourly. Hamstring sets are done by pushing the foot backward against an  object and holding for 5-10 sec. Repeat as with quad sets.  °· Leg Slides: Lying on your back, slowly slide your foot toward your buttocks, bending your knee up off the floor (only go as far as is comfortable). Then slowly slide your foot back down until your leg is flat on the floor again. °· Angel Wings: Lying on your back spread your legs to the side as far apart as you can without causing discomfort.  °A rehabilitation program following serious knee injuries can speed recovery and prevent re-injury in the future due to weakened muscles. Contact your doctor or a physical therapist for more information on knee rehabilitation.  ° °IF YOU ARE TRANSFERRED TO A SKILLED REHAB FACILITY °If the patient is transferred to a skilled rehab facility following release from the hospital, a list of the current medications will be sent to the facility for the patient to continue.  When discharged from the skilled rehab facility, please have the facility set up the patient's Home Health Physical Therapy prior to being released. Also, the skilled facility will be responsible for providing the patient with their medications at time of release from the facility to include their pain medication, the muscle relaxants, and their blood thinner medication. If the patient is still at the rehab facility at time of the two week follow up appointment, the skilled rehab facility will also need to assist the patient in arranging follow up appointment in our office and any transportation needs. ° °MAKE SURE YOU:  °• Understand these instructions.  °• Get help right away if you are not doing well or get worse.  ° ° °Pick up stool softner and laxative for home use following surgery while on pain medications. °Do not submerge incision under water. °Please use good hand washing techniques while changing dressing each day. °May shower starting three days after surgery. °Please use a clean towel to pat the incision dry following showers. °Continue to  use ice for pain and swelling after surgery. °Do not use any lotions or creams on the incision until instructed by your surgeon. ° °

## 2019-03-27 NOTE — Op Note (Signed)
OPERATIVE REPORT-TOTAL KNEE ARTHROPLASTY   Pre-operative diagnosis- Osteoarthritis  Right knee(s)  Post-operative diagnosis- Osteoarthritis Right knee(s)  Procedure-  Right  Total Knee Arthroplasty  Surgeon- Dione Plover. Mackenze Grandison, MD  Assistant- Ardeen Jourdain, PA-C   Anesthesia-  Adductor canal block and spinal  EBL-25 mL   Drains Hemovac  Tourniquet time-  Total Tourniquet Time Documented: Thigh (Right) - 39 minutes Total: Thigh (Right) - 39 minutes     Complications- None  Condition-PACU - hemodynamically stable.   Brief Clinical Note  Melissa Ochoa is a 53 y.o. year old female with end stage OA of her right knee with progressively worsening pain and dysfunction. She has constant pain, with activity and at rest and significant functional deficits with difficulties even with ADLs. She has had extensive non-op management including analgesics, injections of cortisone and viscosupplements, and home exercise program, but remains in significant pain with significant dysfunction.Radiographs show bone on bone arthritis lateral and patellofemoral. She presents now for right Total Knee Arthroplasty.    Procedure in detail---   The patient is brought into the operating room and positioned supine on the operating table. After successful administration of  Adductor canal block and spinal,   a tourniquet is placed high on the  Right thigh(s) and the lower extremity is prepped and draped in the usual sterile fashion. Time out is performed by the operating team and then the  Right lower extremity is wrapped in Esmarch, knee flexed and the tourniquet inflated to 300 mmHg.       A midline incision is made with a ten blade through the subcutaneous tissue to the level of the extensor mechanism. A fresh blade is used to make a medial parapatellar arthrotomy. Soft tissue over the proximal medial tibia is subperiosteally elevated to the joint line with a knife and into the semimembranosus bursa  with a Cobb elevator. Soft tissue over the proximal lateral tibia is elevated with attention being paid to avoiding the patellar tendon on the tibial tubercle. The patella is everted, knee flexed 90 degrees and the ACL and PCL are removed. Findings are bone on bone lateral and patellofemoral with large lateral osteophytes.        The drill is used to create a starting hole in the distal femur and the canal is thoroughly irrigated with sterile saline to remove the fatty contents. The 5 degree Right  valgus alignment guide is placed into the femoral canal and the distal femoral cutting block is pinned to remove 9 mm off the distal femur. Resection is made with an oscillating saw.      The tibia is subluxed forward and the menisci are removed. The extramedullary alignment guide is placed referencing proximally at the medial aspect of the tibial tubercle and distally along the second metatarsal axis and tibial crest. The block is pinned to remove 36mm off the more deficient lateral  side. Resection is made with an oscillating saw. Size 5is the most appropriate size for the tibia and the proximal tibia is prepared with the modular drill and keel punch for that size.      The femoral sizing guide is placed and size 6 is most appropriate. Rotation is marked off the epicondylar axis and confirmed by creating a rectangular flexion gap at 90 degrees. The size 6 cutting block is pinned in this rotation and the anterior, posterior and chamfer cuts are made with the oscillating saw. The intercondylar block is then placed and that cut is made.  Trial size 5 tibial component, trial size 6 posterior stabilized femur and a 8  mm posterior stabilized rotating platform insert trial is placed. Full extension is achieved with excellent varus/valgus and anterior/posterior balance throughout full range of motion. The patella is everted and thickness measured to be 22  mm. Free hand resection is taken to 12 mm, a 35 template is  placed, lug holes are drilled, trial patella is placed, and it tracks normally. Osteophytes are removed off the posterior femur with the trial in place. All trials are removed and the cut bone surfaces prepared with pulsatile lavage. Cement is mixed and once ready for implantation, the size 5 tibial implant, size  6 posterior stabilized femoral component, and the size 35 patella are cemented in place and the patella is held with the clamp. The trial insert is placed and the knee held in full extension. The Exparel (20 ml mixed with 60 ml saline) is injected into the extensor mechanism, posterior capsule, medial and lateral gutters and subcutaneous tissues.  All extruded cement is removed and once the cement is hard the permanent 8 mm posterior stabilized rotating platform insert is placed into the tibial tray.      The wound is copiously irrigated with saline solution and the extensor mechanism closed over a hemovac drain with #1 V-loc suture. The tourniquet is released for a total tourniquet time of 39  minutes. Flexion against gravity is 140 degrees and the patella tracks normally. Subcutaneous tissue is closed with 2.0 vicryl and subcuticular with running 4.0 Monocryl. The incision is cleaned and dried and steri-strips and a bulky sterile dressing are applied. The limb is placed into a knee immobilizer and the patient is awakened and transported to recovery in stable condition.      Please note that a surgical assistant was a medical necessity for this procedure in order to perform it in a safe and expeditious manner. Surgical assistant was necessary to retract the ligaments and vital neurovascular structures to prevent injury to them and also necessary for proper positioning of the limb to allow for anatomic placement of the prosthesis.   Dione Plover Akasia Ahmad, MD    03/27/2019, 5:10 PM

## 2019-03-27 NOTE — Anesthesia Procedure Notes (Signed)
Procedure Name: MAC Date/Time: 03/27/2019 4:02 PM Performed by: West Pugh, CRNA Pre-anesthesia Checklist: Patient identified, Emergency Drugs available, Suction available, Patient being monitored and Timeout performed Patient Re-evaluated:Patient Re-evaluated prior to induction Oxygen Delivery Method: Simple face mask Preoxygenation: Pre-oxygenation with 100% oxygen Induction Type: IV induction Placement Confirmation: positive ETCO2 Dental Injury: Teeth and Oropharynx as per pre-operative assessment

## 2019-03-27 NOTE — Transfer of Care (Signed)
Immediate Anesthesia Transfer of Care Note  Patient: Melissa Ochoa  Procedure(s) Performed: TOTAL KNEE ARTHROPLASTY (Right Knee)  Patient Location: PACU  Anesthesia Type:Spinal and MAC combined with regional for post-op pain  Level of Consciousness: awake, alert , oriented and patient cooperative  Airway & Oxygen Therapy: Patient Spontanous Breathing and Patient connected to face mask oxygen  Post-op Assessment: Report given to RN and Post -op Vital signs reviewed and stable  Post vital signs: Reviewed and stable  Last Vitals:  Vitals Value Taken Time  BP 120/80 03/27/19 1730  Temp 36.4 C 03/27/19 1730  Pulse 73 03/27/19 1732  Resp 16 03/27/19 1732  SpO2 100 % 03/27/19 1732  Vitals shown include unvalidated device data.  Last Pain:  Vitals:   03/27/19 1323  TempSrc:   PainSc: 6       Patients Stated Pain Goal: 4 (20/80/22 3361)  Complications: No apparent anesthesia complications

## 2019-03-27 NOTE — Interval H&P Note (Signed)
History and Physical Interval Note:  03/27/2019 2:04 PM  Melissa Ochoa  has presented today for surgery, with the diagnosis of right knee osteoarthritis.  The various methods of treatment have been discussed with the patient and family. After consideration of risks, benefits and other options for treatment, the patient has consented to  Procedure(s) with comments: TOTAL KNEE ARTHROPLASTY (Right) - 10min as a surgical intervention.  The patient's history has been reviewed, patient examined, no change in status, stable for surgery.  I have reviewed the patient's chart and labs.  Questions were answered to the patient's satisfaction.     Pilar Plate Delando Satter

## 2019-03-27 NOTE — Anesthesia Postprocedure Evaluation (Signed)
Anesthesia Post Note  Patient: Melissa Ochoa  Procedure(s) Performed: TOTAL KNEE ARTHROPLASTY (Right Knee)     Patient location during evaluation: Nursing Unit Anesthesia Type: Spinal Level of consciousness: oriented and awake and alert Pain management: pain level controlled Vital Signs Assessment: post-procedure vital signs reviewed and stable Respiratory status: spontaneous breathing and respiratory function stable Cardiovascular status: blood pressure returned to baseline and stable Postop Assessment: no headache, no backache, no apparent nausea or vomiting and patient able to bend at knees Anesthetic complications: no    Last Vitals:  Vitals:   03/27/19 1845 03/27/19 1900  BP: 125/83 136/87  Pulse: 70 66  Resp: 16 13  Temp:    SpO2: 96% 95%    Last Pain:  Vitals:   03/27/19 1845  TempSrc:   PainSc: 0-No pain                 Barnet Glasgow

## 2019-03-27 NOTE — Anesthesia Procedure Notes (Addendum)
Spinal  Patient location during procedure: OR Start time: 03/27/2019 4:04 PM End time: 03/27/2019 4:13 PM Reason for block: at surgeon's request Staffing Resident/CRNA: West Pugh, CRNA Performed: resident/CRNA  Preanesthetic Checklist Completed: patient identified, site marked, surgical consent, pre-op evaluation, timeout performed, IV checked, risks and benefits discussed and monitors and equipment checked Spinal Block Patient position: sitting Prep: DuraPrep Patient monitoring: heart rate, continuous pulse ox and blood pressure Approach: midline Location: L3-4 Injection technique: single-shot Needle Needle type: Pencan  Needle gauge: 24 G Needle length: 9 cm Assessment Sensory level: T4 Additional Notes Expiration of kit checked and confirmed. Patient tolerated procedure well,without complications with noted clear CSF. Loss of motor and sensory on exam post injection. Dr. Valma Cava present during procedure.

## 2019-03-27 NOTE — Anesthesia Procedure Notes (Signed)
Anesthesia Regional Block: Adductor canal block   Pre-Anesthetic Checklist: ,, timeout performed, Correct Patient, Correct Site, Correct Laterality, Correct Procedure, Correct Position, site marked, Risks and benefits discussed,  Surgical consent,  Pre-op evaluation,  At surgeon's request and post-op pain management  Laterality: Lower and Right  Prep: chloraprep       Needles:  Injection technique: Single-shot  Needle Type: Echogenic Needle     Needle Length: 9cm  Needle Gauge: 22     Additional Needles:   Procedures:,,,, ultrasound used (permanent image in chart),,,,  Narrative:  Start time: 03/27/2019 2:50 PM End time: 03/27/2019 2:57 PM Injection made incrementally with aspirations every 5 mL.  Performed by: Personally  Anesthesiologist: Barnet Glasgow, MD  Additional Notes: Block assessed prior to surgery. Pt tolerated procedure well.

## 2019-03-28 ENCOUNTER — Encounter (HOSPITAL_COMMUNITY): Payer: Self-pay | Admitting: Orthopedic Surgery

## 2019-03-28 LAB — BASIC METABOLIC PANEL
Anion gap: 9 (ref 5–15)
BUN: 14 mg/dL (ref 6–20)
CO2: 23 mmol/L (ref 22–32)
Calcium: 8.3 mg/dL — ABNORMAL LOW (ref 8.9–10.3)
Chloride: 109 mmol/L (ref 98–111)
Creatinine, Ser: 0.57 mg/dL (ref 0.44–1.00)
GFR calc Af Amer: >60 mL/min (ref >60)
GFR calc non Af Amer: >60 mL/min (ref >60)
Glucose, Bld: 114 mg/dL — ABNORMAL HIGH (ref 70–99)
Potassium: 4 mmol/L (ref 3.5–5.1)
Sodium: 141 mmol/L (ref 135–145)

## 2019-03-28 LAB — CBC
HCT: 34.5 % — ABNORMAL LOW (ref 36.0–46.0)
Hemoglobin: 10.7 g/dL — ABNORMAL LOW (ref 12.0–15.0)
MCH: 29.2 pg (ref 26.0–34.0)
MCHC: 31 g/dL (ref 30.0–36.0)
MCV: 94 fL (ref 80.0–100.0)
Platelets: 257 10*3/uL (ref 150–400)
RBC: 3.67 MIL/uL — ABNORMAL LOW (ref 3.87–5.11)
RDW: 14.6 % (ref 11.5–15.5)
WBC: 6.6 10*3/uL (ref 4.0–10.5)
nRBC: 0 % (ref 0.0–0.2)

## 2019-03-28 LAB — MAGNESIUM: Magnesium: 1.7 mg/dL (ref 1.7–2.4)

## 2019-03-28 MED ORDER — QUETIAPINE FUMARATE 100 MG PO TABS
300.0000 mg | ORAL_TABLET | Freq: Every day | ORAL | Status: DC
  Administered 2019-03-28 (×2): 300 mg via ORAL
  Filled 2019-03-28 (×3): qty 3

## 2019-03-28 NOTE — Evaluation (Signed)
Physical Therapy Evaluation Patient Details Name: Melissa Ochoa MRN: [id number] DOB: [birth year] Today's Date: 03/28/2019   History of Present Illness  Pt is a 53 year old female s/p R TKA  Clinical Impression  Pt is s/p TKA resulting in the deficits listed below (see PT Problem List).  Pt will benefit from skilled PT to increase their independence and safety with mobility to allow discharge to the venue listed below.   Pt assisted with ambulating short distance in hallway POD #1.  Pt reports increased pain with mobility however reports tolerable and able to continue.  Pt plans to d/c home tomorrow.     Follow Up Recommendations Follow surgeon's recommendation for DC plan and follow-up therapies    Equipment Recommendations  None recommended by PT    Recommendations for Other Services       Precautions / Restrictions Precautions Precautions: Knee Required Braces or Orthoses: Knee Immobilizer - Right Restrictions Other Position/Activity Restrictions: WBAT      Mobility  Bed Mobility Overal bed mobility: Needs Assistance Bed Mobility: Supine to Sit     Supine to sit: Min assist;HOB elevated     General bed mobility comments: assist for R LE  Transfers Overall transfer level: Needs assistance Equipment used: Rolling walker (2 wheeled) Transfers: Sit to/from Stand Sit to Stand: Min assist         General transfer comment: verbal cues for UE and LE positioning, assist to rise and steady  Ambulation/Gait Ambulation/Gait assistance: Min guard Gait Distance (Feet): 60 Feet Assistive device: Rolling walker (2 wheeled) Gait Pattern/deviations: Step-to pattern;Decreased stance time - right;Antalgic Gait velocity: decr   General Gait Details: verbal cues for sequence, RW positioning, step length, pt tends to keep heel off floor so provided cues to attempt full foot contact  Stairs            Wheelchair Mobility    Modified Rankin (Stroke Patients Only)        Balance                                             Pertinent Vitals/Pain Pain Assessment: 0-10 Pain Score: 6  Pain Location: R TKA Pain Descriptors / Indicators: Sore;Aching Pain Intervention(s): Monitored during session;Limited activity within patient's tolerance;Repositioned;Ice applied    Home Living Family/patient expects to be discharged to:: Private residence Living Arrangements: Spouse/significant other   Type of Home: House Home Access: Stairs to enter   [employer] of Steps: 2-3 Home Layout: Able to live on main level with bedroom/bathroom Home Equipment: Walker - 2 wheels;Walker - 4 wheels;Bedside commode      Prior Function Level of Independence: Independent               Hand Dominance        Extremity/Trunk Assessment        Lower Extremity Assessment Lower Extremity Assessment: RLE deficits/detail RLE Deficits / Details: unable to perform SLR, maintained KI, able to perform ankle pumps       Communication   Communication: No difficulties  Cognition Arousal/Alertness: Awake/alert Behavior During Therapy: WFL for tasks assessed/performed Overall Cognitive Status: Within Functional Limits for tasks assessed  General Comments      Exercises     Assessment/Plan    PT Assessment Patient needs continued PT services  PT Problem List Decreased strength;Decreased activity tolerance;Decreased balance;Decreased knowledge of use of DME;Decreased knowledge of precautions;Decreased mobility;Decreased range of motion;Pain       PT Treatment Interventions Stair training;Gait training;Therapeutic exercise;DME instruction;Therapeutic activities;Patient/family education;Functional mobility training    PT Goals (Current goals can be found in the Care Plan section)  Acute Rehab PT Goals PT Goal Formulation: With patient Time For Goal Achievement:  04/01/19 Potential to Achieve Goals: Good    Frequency 7X/week   Barriers to discharge        Co-evaluation               AM-PAC PT "6 Clicks" Mobility  Outcome Measure Help needed turning from your back to your side while in a flat bed without using bedrails?: A Little Help needed moving from lying on your back to sitting on the side of a flat bed without using bedrails?: A Little Help needed moving to and from a bed to a chair (including a wheelchair)?: A Little Help needed standing up from a chair using your arms (e.g., wheelchair or bedside chair)?: A Little Help needed to walk in hospital room?: A Little Help needed climbing 3-5 steps with a railing? : A Lot 6 Click Score: 17    End of Session Equipment Utilized During Treatment: Gait belt;Right knee immobilizer Activity Tolerance: Patient tolerated treatment well Patient left: in chair;with chair alarm set;with call bell/phone within reach        Time: 0981-1914 PT Time Calculation (min) (ACUTE ONLY): 15 min   Charges:   PT Evaluation $PT Eval Low Complexity: [place], PT, DPT Acute Rehabilitation Services Office: [phone] Pager: [phone]  Trena Platt 03/28/2019, 12:01 PM

## 2019-03-28 NOTE — Progress Notes (Signed)
Physical Therapy Treatment Patient Details Name: Melissa Ochoa MRN: [id number] DOB: May 11, 1966 Today's Date: 03/28/2019    History of Present Illness Pt is a 53 year old female s/p R TKA    PT Comments    Pt assisted with ambulating in hallway again and performed LE exercises. Pt plans to d/c home tomorrow.  Pt requesting home care including HHPT upon d/c, requested pt discuss f/u care with MD in the morning.   Follow Up Recommendations  Follow surgeon's recommendation for DC plan and follow-up therapies     Equipment Recommendations  None recommended by PT    Recommendations for Other Services       Precautions / Restrictions Precautions Precautions: Knee Required Braces or Orthoses: Knee Immobilizer - Right Restrictions Other Position/Activity Restrictions: WBAT    Mobility  Bed Mobility Overal bed mobility: Needs Assistance Bed Mobility: Supine to Sit     Supine to sit: Min assist;HOB elevated     General bed mobility comments: pt up in recliner on arrival  Transfers Overall transfer level: Needs assistance Equipment used: Rolling walker (2 wheeled) Transfers: Sit to/from Stand Sit to Stand: Min guard         General transfer comment: verbal cues for UE and LE positioning  Ambulation/Gait Ambulation/Gait assistance: Min guard Gait Distance (Feet): 100 Feet Assistive device: Rolling walker (2 wheeled) Gait Pattern/deviations: Step-to pattern;Decreased stance time - right;Antalgic Gait velocity: decr   General Gait Details: verbal cues for sequence, RW positioning, step length, improved full foot contact however keeps R LE slightly forward throughout gait   Stairs             Wheelchair Mobility    Modified Rankin (Stroke Patients Only)       Balance                                            Cognition Arousal/Alertness: Awake/alert Behavior During Therapy: WFL for tasks assessed/performed Overall Cognitive  Status: Within Functional Limits for tasks assessed                                        Exercises Total Joint Exercises Ankle Circles/Pumps: AROM;Both;10 reps Quad Sets: AROM;Both;10 reps Short Arc Quad: 10 reps;Right;AAROM Heel Slides: AAROM;Right;10 reps Hip ABduction/ADduction: AROM;Right;10 reps Straight Leg Raises: AAROM;Right;10 reps Goniometric ROM: approx -8-45* AAROM R knee    General Comments        Pertinent Vitals/Pain Pain Assessment: 0-10 Pain Score: 6  Pain Location: R TKA Pain Descriptors / Indicators: Sore;Aching;Tightness;Spasm Pain Intervention(s): Monitored during session;Repositioned;Limited activity within patient's tolerance;Ice applied    Home Living Family/patient expects to be discharged to:: Private residence Living Arrangements: Spouse/significant other   Type of Home: House Home Access: Stairs to enter   Home Layout: Able to live on main level with bedroom/bathroom Home Equipment: Environmental consultant - 2 wheels;Walker - 4 wheels;Bedside commode      Prior Function Level of Independence: Independent          PT Goals (current goals can now be found in the care plan section) Acute Rehab PT Goals PT Goal Formulation: With patient Time For Goal Achievement: 04/01/19 Potential to Achieve Goals: Good Progress towards PT goals: Progressing toward goals    Frequency    7X/week  PT Plan Current plan remains appropriate    Co-evaluation              AM-PAC PT "6 Clicks" Mobility   Outcome Measure  Help needed turning from your back to your side while in a flat bed without using bedrails?: A Little Help needed moving from lying on your back to sitting on the side of a flat bed without using bedrails?: A Little Help needed moving to and from a bed to a chair (including a wheelchair)?: A Little Help needed standing up from a chair using your arms (e.g., wheelchair or bedside chair)?: A Little Help needed to walk in  hospital room?: A Little Help needed climbing 3-5 steps with a railing? : A Little 6 Click Score: 18    End of Session Equipment Utilized During Treatment: Gait belt;Right knee immobilizer Activity Tolerance: Patient tolerated treatment well Patient left: in chair;with call bell/phone within reach   PT Visit Diagnosis: Difficulty in walking, not elsewhere classified (R26.2)     Time: 0973-5329 PT Time Calculation (min) (ACUTE ONLY): 19 min  Charges:  $Therapeutic Exercise: 8-22 mins                    Carmelia Bake, PT, DPT Acute Rehabilitation Services Office: [phone] Pager: [phone]  Trena Platt 03/28/2019, 3:23 PM

## 2019-03-28 NOTE — Progress Notes (Signed)
Paged [facility] regarding patient's ongoing muscle spasms in the right leg unrelieved by Robaxin and Flexeril.

## 2019-03-28 NOTE — Progress Notes (Signed)
   Subjective: 1 Day Post-Op Procedure(s) (LRB): TOTAL KNEE ARTHROPLASTY (Right) Patient reports pain as mild.   Patient seen in rounds by Dr. Wynelle Link. Patient is well, and has had no acute complaints or problems. Having difficulties with muscle spasms last night but this is improved this AM per patient. Denies chest pain, SOB, or calf pain. Foley catheter removed.  We will begin therapy today.   Objective: Vital signs in last 24 hours: Temp:  [97.5 F (36.4 C)-98.5 F (36.9 C)] 98.1 F (36.7 C) (06/30 0625) Pulse Rate:  [66-77] 72 (06/30 0625) Resp:  [12-22] 18 (06/30 0625) BP: (120-161)/(78-95) 129/86 (06/30 0625) SpO2:  [94 %-100 %] 98 % (06/30 0625) Weight:  [93 kg] 93 kg (06/29 1323)  Intake/Output from previous day:  Intake/Output Summary (Last 24 hours) at 03/28/2019 0711 Last data filed at 03/28/2019 0625 Gross per 24 hour  Intake 2939.31 ml  Output 2735 ml  Net 204.31 ml    Labs: Recent Labs    03/28/19 0300  HGB 10.7*   Recent Labs    03/28/19 0300  WBC 6.6  RBC 3.67*  HCT 34.5*  PLT 257   Recent Labs    03/28/19 0300  NA 141  K 4.0  CL 109  CO2 23  BUN 14  CREATININE 0.57  GLUCOSE 114*  CALCIUM 8.3*   Exam: General - Patient is Alert and Oriented Extremity - Neurologically intact Neurovascular intact Sensation intact distally Dorsiflexion/Plantar flexion intact Dressing - dressing C/D/I Motor Function - intact, moving foot and toes well on exam.   Past Medical History:  Diagnosis Date  . ADHD (attention deficit hyperactivity disorder)   . Anxiety   . Bipolar disorder ([place])   . Cervical disc disease   . Colon polyps    adenomatous  . Depression   . Hypertension   . Iron deficiency   . Kidney disease    renal angiomyolipomas  . Liver cyst   . Melanosis   . 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   Weight as of this encounter: 93 kg. Advance diet Up with therapy  Anticipated LOS equal to or greater than 2 midnights due to - Age 13 and older with one or more of the following:  - Obesity  - Expected need for hospital services (PT, OT, Nursing) required for safe  discharge  - Anticipated need for postoperative skilled nursing care or inpatient rehab  - Active co-morbidities: None OR   - Unanticipated findings during/Post Surgery: None  - Patient is a high risk of re-admission due to: None    DVT Prophylaxis - Aspirin Weight bearing as tolerated. D/C O2 and pulse ox and try on room air. Hemovac pulled without difficulty, will begin therapy today.  Plan is to go Home after hospital stay. Will require stay until tomorrow for adequate time to work with physical therapy, plan for discharge tomorrow.  Theresa Duty, PA-C Orthopedic Surgery 03/28/2019, 7:11 AM

## 2019-03-29 ENCOUNTER — Other Ambulatory Visit (HOSPITAL_COMMUNITY): Payer: Self-pay | Admitting: Psychiatry

## 2019-03-29 DIAGNOSIS — F411 Generalized anxiety disorder: Secondary | ICD-10-CM

## 2019-03-29 LAB — CBC
HCT: 34.6 % — ABNORMAL LOW (ref 36.0–46.0)
Hemoglobin: 10.4 g/dL — ABNORMAL LOW (ref 12.0–15.0)
MCH: 28.4 pg (ref 26.0–34.0)
MCHC: 30.1 g/dL (ref 30.0–36.0)
MCV: 94.5 fL (ref 80.0–100.0)
Platelets: 259 10*3/uL (ref 150–400)
RBC: 3.66 MIL/uL — ABNORMAL LOW (ref 3.87–5.11)
RDW: 14.6 % (ref 11.5–15.5)
WBC: 6.3 10*3/uL (ref 4.0–10.5)
nRBC: 0 % (ref 0.0–0.2)

## 2019-03-29 LAB — BASIC METABOLIC PANEL
Anion gap: 7 (ref 5–15)
BUN: 14 mg/dL (ref 6–20)
CO2: 28 mmol/L (ref 22–32)
Calcium: 8.9 mg/dL (ref 8.9–10.3)
Chloride: 107 mmol/L (ref 98–111)
Creatinine, Ser: 0.59 mg/dL (ref 0.44–1.00)
GFR calc Af Amer: >60 mL/min (ref >60)
GFR calc non Af Amer: >60 mL/min (ref >60)
Glucose, Bld: 78 mg/dL (ref 70–99)
Potassium: 3.8 mmol/L (ref 3.5–5.1)
Sodium: 142 mmol/L (ref 135–145)

## 2019-03-29 MED ORDER — OXYCODONE HCL 5 MG PO TABS: 5.0000 mg | ORAL_TABLET | Freq: Four times a day (QID) | ORAL | 0 refills | Status: DC | PRN

## 2019-03-29 MED ORDER — METHOCARBAMOL 500 MG PO TABS: 500.0000 mg | ORAL_TABLET | Freq: Four times a day (QID) | ORAL | 0 refills | Status: DC | PRN

## 2019-03-29 MED ORDER — TRAMADOL HCL 50 MG PO TABS: 50.0000 mg | ORAL_TABLET | Freq: Four times a day (QID) | ORAL | 0 refills | Status: DC | PRN

## 2019-03-29 MED ORDER — GABAPENTIN 300 MG PO CAPS: 300.0000 mg | ORAL_CAPSULE | Freq: Three times a day (TID) | ORAL | 0 refills | Status: DC

## 2019-03-29 MED ORDER — ASPIRIN 325 MG PO TBEC: 325.0000 mg | DELAYED_RELEASE_TABLET | Freq: Two times a day (BID) | ORAL | 0 refills | Status: AC

## 2019-03-29 NOTE — Discharge Summary (Signed)
Physician Discharge Summary   Patient ID: Melissa Ochoa MRN: [id number] DOB/AGE: [birth year] 55 y.o.  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   Weight as of this encounter: 93 kg.  Procedure:  Procedure(s) (LRB): TOTAL KNEE ARTHROPLASTY (Right)   Consults: None  HPI: Melissa Ochoa is a 53 y.o. year old female with end stage OA of her right knee with progressively worsening pain and dysfunction. She has constant pain, with activity and at rest and significant functional deficits with difficulties even with ADLs. She has had extensive non-op management including analgesics, injections of cortisone and viscosupplements, and home exercise program, but remains in significant pain with significant dysfunction.Radiographs show bone on bone arthritis lateral and patellofemoral. She presents now for right Total Knee Arthroplasty.   Laboratory Data: Admission on 03/27/2019, Discharged on 03/29/2019  Component Date Value Ref Range Status   WBC 03/28/2019 6.6  4.0 - 10.5 K/uL Final   RBC 03/28/2019 3.67* 3.87 - 5.11 MIL/uL Final   Hemoglobin 03/28/2019 10.7* 12.0 - 15.0 g/dL Final   HCT 03/28/2019 34.5* 36.0 - 46.0 % Final   MCV 03/28/2019 94.0  80.0 - 100.0 fL Final   MCH 03/28/2019 29.2  26.0 - 34.0 pg  Final   MCHC 03/28/2019 31.0  30.0 - 36.0 g/dL Final   RDW 03/28/2019 14.6  11.5 - 15.5 % Final   Platelets 03/28/2019 257  150 - 400 K/uL Final   nRBC 03/28/2019 0.0  0.0 - 0.2 % Final   Performed at [facility], [place] [address] [place], [place] [address]   Sodium 03/28/2019 141  135 - 145 mmol/L Final   Potassium 03/28/2019 4.0  3.5 - 5.1 mmol/L Final   Chloride 03/28/2019 109  98 - 111 mmol/L Final   CO2 03/28/2019 23  22 - 32 mmol/L Final   Glucose, Bld 03/28/2019 114* 70 - 99 mg/dL Final   BUN 03/28/2019 14  6 - 20 mg/dL Final   Creatinine, Ser 03/28/2019 0.57  0.44 - 1.00 mg/dL Final   Calcium 03/28/2019 8.3* 8.9 - 10.3 mg/dL Final   GFR calc non Af Amer 03/28/2019 >60  >60 mL/min Final   GFR calc Af Amer 03/28/2019 >60  >60 mL/min Final   Anion gap 03/28/2019 9  5 - 15 Final   Performed at [facility], [place] [address], [place], [place] [address]   Magnesium 03/28/2019 1.7  1.7 - 2.4 mg/dL Final   Performed at [place] [address] [place], [place] [address]   WBC 03/29/2019 6.3  4.0 - 10.5 K/uL Final   RBC 03/29/2019 3.66* 3.87 - 5.11 MIL/uL Final   Hemoglobin 03/29/2019 10.4* 12.0 - 15.0 g/dL Final  HCT 03/29/2019 34.6* 36.0 - 46.0 % Final   MCV 03/29/2019 94.5  80.0 - 100.0 fL Final   MCH 03/29/2019 28.4  26.0 - 34.0 pg Final   MCHC 03/29/2019 30.1  30.0 - 36.0 g/dL Final   RDW 03/29/2019 14.6  11.5 - 15.5 % Final   Platelets 03/29/2019 259  150 - 400 K/uL Final   nRBC 03/29/2019 0.0  0.0 - 0.2 % Final   Performed at [facility], [place] [address], [place], [place] [address]   Sodium 03/29/2019 142  135 - 145 mmol/L Final   Potassium 03/29/2019 3.8  3.5 - 5.1 mmol/L Final   Chloride 03/29/2019 107  98 - 111 mmol/L Final   CO2 03/29/2019 28  22 - 32 mmol/L Final   Glucose, Bld 03/29/2019 78  70 - 99 mg/dL Final   BUN 03/29/2019 14  6 - 20 mg/dL Final    Creatinine, Ser 03/29/2019 0.59  0.44 - 1.00 mg/dL Final   Calcium 03/29/2019 8.9  8.9 - 10.3 mg/dL Final   GFR calc non Af Amer 03/29/2019 >60  >60 mL/min Final   GFR calc Af Amer 03/29/2019 >60  >60 mL/min Final   Anion gap 03/29/2019 7  5 - 15 Final   Performed at [facility], [place] [address], [place], [place] [address]  Hospital Outpatient Visit on 03/23/2019  Component Date Value Ref Range Status   SARS Coronavirus 2 03/23/2019 NEGATIVE  NEGATIVE Final   Comment: (NOTE) SARS-CoV-2 target nucleic acids are NOT DETECTED. The SARS-CoV-2 RNA is generally detectable in upper and lower respiratory specimens during the acute phase of infection. Negative results do not preclude SARS-CoV-2 infection, do not rule out co-infections with other pathogens, and should not be used as the sole basis for treatment or other patient management decisions. Negative results must be combined with clinical observations, patient history, and epidemiological information. The expected result is Negative. Fact Sheet for Patients: SugarRoll.be Fact Sheet for Healthcare Providers: https://www.woods-mathews.com/ This test is not yet approved or cleared by the [place] FDA and  has been authorized for detection and/or diagnosis of SARS-CoV-2 by FDA under an Emergency Use Authorization (EUA). This EUA will remain  in effect (meaning this test can be used) for the duration of the COVID-19 declaration under Section 56                          4(b)(1) of the Act, 21 U.S.C. section 360bbb-3(b)(1), unless the authorization is terminated or revoked sooner. Performed at [facility], [place] [address], [place], [place] [address]   Hospital Outpatient Visit on 03/23/2019  Component Date Value Ref Range Status   aPTT 03/23/2019 39* 24 - 36 seconds Final   Comment:        IF BASELINE aPTT IS ELEVATED, SUGGEST PATIENT RISK ASSESSMENT BE USED TO  DETERMINE APPROPRIATE ANTICOAGULANT THERAPY. Performed at [facility], [place] [address], [place], [place] [address]    WBC 03/23/2019 8.7  4.0 - 10.5 K/uL Final   RBC 03/23/2019 4.24  3.87 - 5.11 MIL/uL Final   Hemoglobin 03/23/2019 12.0  12.0 - 15.0 g/dL Final   HCT 03/23/2019 39.9  36.0 - 46.0 % Final   MCV 03/23/2019 94.1  80.0 - 100.0 fL Final   MCH 03/23/2019 28.3  26.0 - 34.0 pg Final   MCHC 03/23/2019 30.1  30.0 - 36.0 g/dL Final   RDW 03/23/2019 14.1  11.5 - 15.5 % Final   Platelets 03/23/2019 282  150 - 400 K/uL Final   nRBC 03/23/2019 0.0  0.0 - 0.2 % Final   Performed at [facility], [place] [address], [place], [place] [address]   Sodium 03/23/2019 141  135 - 145 mmol/L Final   Potassium 03/23/2019 4.8  3.5 - 5.1 mmol/L Final   Chloride 03/23/2019 107  98 - 111 mmol/L Final   CO2 03/23/2019 26  22 - 32 mmol/L Final   Glucose, Bld 03/23/2019 95  70 - 99 mg/dL Final   BUN 03/23/2019 33* 6 - 20 mg/dL Final   Creatinine, Ser 03/23/2019 0.80  0.44 - 1.00 mg/dL Final   Calcium 03/23/2019 9.9  8.9 - 10.3 mg/dL Final   Total Protein 03/23/2019 7.6  6.5 - 8.1 g/dL Final   Albumin 03/23/2019 4.5  3.5 - 5.0 g/dL Final   AST 03/23/2019 20  15 - 41 U/L Final   ALT 03/23/2019 18  0 - 44 U/L Final   Alkaline Phosphatase 03/23/2019 83  38 - 126 U/L Final   Total Bilirubin 03/23/2019 0.5  0.3 - 1.2 mg/dL Final   GFR calc non Af Amer 03/23/2019 >60  >60 mL/min Final   GFR calc Af Amer 03/23/2019 >60  >60 mL/min Final   Anion gap 03/23/2019 8  5 - 15 Final   Performed at [facility], [place] [address] [place], [place] [address]   Prothrombin Time 03/23/2019 12.0  11.4 - 15.2 seconds Final   INR 03/23/2019 0.9  0.8 - 1.2 Final   Comment: (NOTE) INR goal varies based on device and disease states. Performed at [facility], [place] [address], [place], [place] [address]    ABO/RH(D)  03/23/2019 O POS   Final   Antibody Screen 03/23/2019 NEG   Final   Sample Expiration 03/23/2019 03/30/2019,2359   Final   Extend sample reason 03/23/2019    Final                   Value:NO TRANSFUSIONS OR PREGNANCY IN THE PAST 3 MONTHS Performed at [place] [address], [place], [place] [address]    MRSA, PCR 03/23/2019 NEGATIVE  NEGATIVE Final   Staphylococcus aureus 03/23/2019 POSITIVE* NEGATIVE Final   Comment: (NOTE) The Xpert SA Assay (FDA approved for NASAL specimens in patients 81 years of age and older), is one component of a comprehensive surveillance program. It is not intended to diagnose infection nor to guide or monitor treatment. Performed at [facility], [place] [address] [place], [place] [address]      X-Rays:No results found.  EKG: Orders placed or performed during the hospital encounter of 09/23/18   EKG 12 lead   EKG 12 lead     Hospital Course: OLIWIA BERZINS is a 53 y.o. who was admitted to [facility]. They were brought to the operating room on 03/27/2019 and underwent Procedure(s): TOTAL KNEE ARTHROPLASTY.  Patient tolerated the procedure well and was later transferred to the recovery room and then to the orthopaedic floor for postoperative care. They were given PO and IV analgesics for pain control following their surgery. They were given 24 hours of postoperative antibiotics of  Anti-infectives (From admission, onward)   Start     Dose/Rate Route Frequency Ordered Stop   03/28/19 0600  ceFAZolin (ANCEF) IVPB 2g/100 mL premix     2 g 200 mL/hr over 30 Minutes  Intravenous On call to O.R. 03/27/19 1257 03/27/19 1644   03/27/19 2300  ceFAZolin (ANCEF) IVPB 2g/100 mL premix     2 g 200 mL/hr over 30 Minutes Intravenous Every 6 hours 03/27/19 2153 03/28/19 0845     and started on DVT prophylaxis in the form of Aspirin.   PT and OT were ordered for total joint protocol. Discharge planning  consulted to help with postop disposition and equipment needs. Patient had a good night on the evening of surgery. They started to get up OOB with therapy on POD #1. Hemovac drain was pulled without difficulty on day one. Continued to work with therapy into POD #2. Pt was seen during rounds on day two and was ready to go home pending progress with therapy. Dressing was changed and the incision was clean, dry, and intact with no drainage. Pt worked with therapy for one additional session and was meeting their goals. She was discharged to home later that day in stable condition.  Diet: Regular diet Activity: WBAT Follow-up: in 2 weeks Disposition: Home with outpatient physical therapy at [facility] Discharged Condition: stable   Discharge Instructions    Call MD / Call 911   Complete by: As directed    If you experience chest pain or shortness of breath, CALL 911 and be transported to the hospital emergency room.  If you develope a fever above 101 F, pus (white drainage) or increased drainage or redness at the wound, or calf pain, call your surgeon's office.   Change dressing   Complete by: As directed    Change the dressing daily with sterile 4 x 4 inch gauze dressing and apply TED hose.   Constipation Prevention   Complete by: As directed    Drink plenty of fluids.  Prune juice may be helpful.  You may use a stool softener, such as Colace (over the counter) 100 mg twice a day.  Use MiraLax (over the counter) for constipation as needed.   Diet - low sodium heart healthy   Complete by: As directed    Discharge instructions   Complete by: As directed    Dr. Gaynelle Arabian Total Joint Specialist [facility] [address], [place], [place] [address] [phone]  TOTAL KNEE REPLACEMENT POSTOPERATIVE DIRECTIONS  Knee Rehabilitation, Guidelines Following Surgery  Results after knee surgery are often greatly improved when you follow the exercise, range of motion and muscle  strengthening exercises prescribed by your doctor. Safety measures are also important to protect the knee from further injury. Any time any of these exercises cause you to have increased pain or swelling in your knee joint, decrease the amount until you are comfortable again and slowly increase them. If you have problems or questions, call your caregiver or physical therapist for advice.   HOME CARE INSTRUCTIONS  Remove items at home which could result in a fall. This includes throw rugs or furniture in walking pathways.  ICE to the affected knee every three hours for 30 minutes at a time and then as needed for pain and swelling.  Continue to use ice on the knee for pain and swelling from surgery. You may notice swelling that will progress down to the foot and ankle.  This is normal after surgery.  Elevate the leg when you are not up walking on it.   Continue to use the breathing machine which will help keep your temperature down.  It is common for your temperature to cycle up and down following surgery,  especially at night when you are not up moving around and exerting yourself.  The breathing machine keeps your lungs expanded and your temperature down. Do not place pillow under knee, focus on keeping the knee straight while resting   DIET You may resume your previous home diet once your are discharged from the hospital.  DRESSING / WOUND CARE / SHOWERING You may change your dressing 3-5 days after surgery.  Then change the dressing every day with sterile gauze.  Please use good hand washing techniques before changing the dressing.  Do not use any lotions or creams on the incision until instructed by your surgeon. You may start showering once you are discharged home but do not submerge the incision under water. Just pat the incision dry and apply a dry gauze dressing on daily. Change the surgical dressing daily and reapply a dry dressing each time.  ACTIVITY Walk with your walker as  instructed. Use walker as long as suggested by your caregivers. Avoid periods of inactivity such as sitting longer than an hour when not asleep. This helps prevent blood clots.  You may resume a sexual relationship in one month or when given the OK by your doctor.  You may return to work once you are cleared by your doctor.  Do not drive a car for 6 weeks or until released by you surgeon.  Do not drive while taking narcotics.  WEIGHT BEARING Weight bearing as tolerated with assist device (walker, cane, etc) as directed, use it as long as suggested by your surgeon or therapist, typically at least 4-6 weeks.  POSTOPERATIVE CONSTIPATION PROTOCOL Constipation - defined medically as fewer than three stools per week and severe constipation as less than one stool per week.  One of the most common issues patients have following surgery is constipation.  Even if you have a regular bowel pattern at home, your normal regimen is likely to be disrupted due to multiple reasons following surgery.  Combination of anesthesia, postoperative narcotics, change in appetite and fluid intake all can affect your bowels.  In order to avoid complications following surgery, here are some recommendations in order to help you during your recovery period.  Colace (docusate) - Pick up an over-the-counter form of Colace or another stool softener and take twice a day as long as you are requiring postoperative pain medications.  Take with a full glass of water daily.  If you experience loose stools or diarrhea, hold the colace until you stool forms back up.  If your symptoms do not get better within 1 week or if they get worse, check with your doctor.  Dulcolax (bisacodyl) - Pick up over-the-counter and take as directed by the product packaging as needed to assist with the movement of your bowels.  Take with a full glass of water.  Use this product as needed if not relieved by Colace only.   MiraLax (polyethylene glycol) - Pick  up over-the-counter to have on hand.  MiraLax is a solution that will increase the amount of water in your bowels to assist with bowel movements.  Take as directed and can mix with a glass of water, juice, soda, coffee, or tea.  Take if you go more than two days without a movement. Do not use MiraLax more than once per day. Call your doctor if you are still constipated or irregular after using this medication for 7 days in a row.  If you continue to have problems with postoperative constipation, please contact the office for  further assistance and recommendations.  If you experience "the worst abdominal pain ever" or develop nausea or vomiting, please contact the office immediatly for further recommendations for treatment.  ITCHING  If you experience itching with your medications, try taking only a single pain pill, or even half a pain pill at a time.  You can also use Benadryl over the counter for itching or also to help with sleep.   TED HOSE STOCKINGS Wear the elastic stockings on both legs for three weeks following surgery during the day but you may remove then at night for sleeping.  MEDICATIONS See your medication summary on the "After Visit Summary" that the nursing staff will review with you prior to discharge.  You may have some home medications which will be placed on hold until you complete the course of blood thinner medication.  It is important for you to complete the blood thinner medication as prescribed by your surgeon.  Continue your approved medications as instructed at time of discharge.  PRECAUTIONS If you experience chest pain or shortness of breath - call 911 immediately for transfer to the hospital emergency department.  If you develop a fever greater that 101 F, purulent drainage from wound, increased redness or drainage from wound, foul odor from the wound/dressing, or calf pain - CONTACT YOUR SURGEON.                                                   FOLLOW-UP  APPOINTMENTS Make sure you keep all of your appointments after your operation with your surgeon and caregivers. You should call the office at the above phone number and make an appointment for approximately two weeks after the date of your surgery or on the date instructed by your surgeon outlined in the "After Visit Summary".   RANGE OF MOTION AND STRENGTHENING EXERCISES  Rehabilitation of the knee is important following a knee injury or an operation. After just a few days of immobilization, the muscles of the thigh which control the knee become weakened and shrink (atrophy). Knee exercises are designed to build up the tone and strength of the thigh muscles and to improve knee motion. Often times heat used for twenty to thirty minutes before working out will loosen up your tissues and help with improving the range of motion but do not use heat for the first two weeks following surgery. These exercises can be done on a training (exercise) mat, on the floor, on a table or on a bed. Use what ever works the best and is most comfortable for you Knee exercises include:  Leg Lifts - While your knee is still immobilized in a splint or cast, you can do straight leg raises. Lift the leg to 60 degrees, hold for 3 sec, and slowly lower the leg. Repeat 10-20 times 2-3 times daily. Perform this exercise against resistance later as your knee gets better.  Quad and Hamstring Sets - Tighten up the muscle on the front of the thigh (Quad) and hold for 5-10 sec. Repeat this 10-20 times hourly. Hamstring sets are done by pushing the foot backward against an object and holding for 5-10 sec. Repeat as with quad sets.  Leg Slides: Lying on your back, slowly slide your foot toward your buttocks, bending your knee up off the floor (only go as far as is comfortable). Then slowly  slide your foot back down until your leg is flat on the floor again. Angel Wings: Lying on your back spread your legs to the side as far apart as you can  without causing discomfort.  A rehabilitation program following serious knee injuries can speed recovery and prevent re-injury in the future due to weakened muscles. Contact your doctor or a physical therapist for more information on knee rehabilitation.   IF YOU ARE TRANSFERRED TO A SKILLED REHAB FACILITY If the patient is transferred to a skilled rehab facility following release from the hospital, a list of the current medications will be sent to the facility for the patient to continue.  When discharged from the skilled rehab facility, please have the facility set up the patient's [place] prior to being released. Also, the skilled facility will be responsible for providing the patient with their medications at time of release from the facility to include their pain medication, the muscle relaxants, and their blood thinner medication. If the patient is still at the rehab facility at time of the two week follow up appointment, the skilled rehab facility will also need to assist the patient in arranging follow up appointment in our office and any transportation needs.  MAKE SURE YOU:  Understand these instructions.  Get help right away if you are not doing well or get worse.    Pick up stool softner and laxative for home use following surgery while on pain medications. Do not submerge incision under water. Please use good hand washing techniques while changing dressing each day. May shower starting three days after surgery. Please use a clean towel to pat the incision dry following showers. Continue to use ice for pain and swelling after surgery. Do not use any lotions or creams on the incision until instructed by your surgeon.   Do not put a pillow under the knee. Place it under the heel.   Complete by: As directed    Driving restrictions   Complete by: As directed    No driving for two weeks   TED hose   Complete by: As directed    Use stockings (TED hose) for three  weeks on both leg(s).  You may remove them at night for sleeping.   Weight bearing as tolerated   Complete by: As directed      Allergies as of 03/29/2019   No Known Allergies     Medication List    TAKE these medications   ALPRAZolam 1 MG tablet Commonly known as: XANAX Take 1 tablet (1 mg total) by mouth 4 (four) times daily. What changed:   when to take this  reasons to take this   amphetamine-dextroamphetamine 30 MG tablet Commonly known as: Adderall Take 1 tablet by mouth 2 (two) times daily. What changed:   when to take this  reasons to take this   aspirin 325 MG EC tablet Take 1 tablet (325 mg total) by mouth 2 (two) times daily for 19 days. Then take one 81 mg aspirin once a day for three weeks. Then discontinue aspirin.   cyclobenzaprine 10 MG tablet Commonly known as: FLEXERIL Take 10 mg by mouth every 8 (eight) hours as needed for muscle spasms.   gabapentin 300 MG capsule Commonly known as: NEURONTIN Take 1 capsule (300 mg total) by mouth 3 (three) times daily. Take a 300 mg capsule three times a day for two weeks following surgery.Then take a 300 mg capsule two times a day for two weeks.  Then take a 300 mg capsule once a day for two weeks. Then discontinue.   labetalol 200 MG tablet Commonly known as: NORMODYNE Take 400 mg by mouth 3 (three) times daily.   lamoTRIgine 100 MG tablet Commonly known as: LAMICTAL TAKE 1 TABLET(100 MG) BY MOUTH TWICE DAILY What changed: See the new instructions.   losartan 50 MG tablet Commonly known as: COZAAR Take 50 mg by mouth daily.   methocarbamol 500 MG tablet Commonly known as: ROBAXIN Take 1 tablet (500 mg total) by mouth every 6 (six) hours as needed for muscle spasms.   oxyCODONE 5 MG immediate release tablet Commonly known as: Oxy IR/ROXICODONE Take 1-2 tablets (5-10 mg total) by mouth every 6 (six) hours as needed for severe pain.   promethazine 25 MG tablet Commonly known as: PHENERGAN Take 25 mg by  mouth every 6 (six) hours as needed for nausea or vomiting.   QUEtiapine 300 MG tablet Commonly known as: SEROQUEL TAKE 1 TABLET(300 MG) BY MOUTH AT BEDTIME What changed: See the new instructions.   sertraline 100 MG tablet Commonly known as: ZOLOFT TAKE 2 TABLETS(200 MG) BY MOUTH DAILY What changed: See the new instructions.   traMADol 50 MG tablet Commonly known as: ULTRAM Take 1-2 tablets (50-100 mg total) by mouth every 6 (six) hours as needed for moderate pain. What changed:   how much to take  when to take this  reasons to take this            Discharge Care Instructions  (From admission, onward)         Start     Ordered   03/28/19 0000  Weight bearing as tolerated     03/28/19 0714   03/28/19 0000  Change dressing    Comments: Change the dressing daily with sterile 4 x 4 inch gauze dressing and apply TED hose.   03/28/19 7482         Follow-up Information    Gaynelle Arabian, MD. Schedule an appointment as soon as possible for a visit on 04/11/2019.   Specialty: Orthopedic Surgery Contact information: [address] [place] [address] [phone]        Home, [facility]   Specialty: [facility] Contact information: [place] [address] [phone]           Signed: Theresa Duty, PA-C Orthopedic Surgery 03/29/2019, 2:02 PM

## 2019-03-29 NOTE — TOC Transition Note (Signed)
Transition of Care [facility]) - CM/SW Discharge Note   Patient Details  Name: Melissa Ochoa MRN: [id number] Date of Birth: [birth year]  [facility] CM/SW Contact:  Leeroy Cha, RN Phone Number: 03/29/2019, 9:33 AM   Clinical Narrative:     hhc-pt kah  Final next level of care: [place] Barriers to Discharge: No Barriers Identified   Patient Goals and CMS Choice Patient states their goals for this hospitalization and ongoing recovery are:: just to go home and get moving CMS Medicare.gov Compare Post Acute Care list provided to:: Patient Choice offered to / list presented to : Patient  Discharge Placement                       Discharge Plan and Services   Discharge Planning Services: CM Consult Post Acute Care Choice: Durable Medical Equipment, Home Health          DME Arranged: Walker rolling, 3-N-1         HH Arranged: PT [place]: [facility] [employer] Date [place]: 03/29/19 Time [place]: 0900 Representative spoke with at [place]: [place] ([place]) Interventions     Readmission Risk Interventions No flowsheet data found.

## 2019-03-29 NOTE — Progress Notes (Signed)
   Subjective: 2 Days Post-Op Procedure(s) (LRB): TOTAL KNEE ARTHROPLASTY (Right) Patient reports pain as mild.   Patient seen in rounds for Dr. Wynelle Link. Patient is well, and has had no acute complaints or problems other than pain in the right knee. Endorses good appetite, no issues overnight. Voiding without difficulty and positive flatus. Denies chest pain, SOB, or calf pain.  Plan is to go Home after hospital stay.  Objective: Vital signs in last 24 hours: Temp:  [97.6 F (36.4 C)-98.4 F (36.9 C)] 98 F (36.7 C) (07/01 0542) Pulse Rate:  [70-85] 75 (07/01 0542) Resp:  [16] 16 (07/01 0542) BP: (105-154)/(73-88) 154/88 (07/01 0542) SpO2:  [97 %-100 %] 97 % (07/01 0542)  Intake/Output from previous day:  Intake/Output Summary (Last 24 hours) at 03/29/2019 0805 Last data filed at 03/29/2019 0550 Gross per 24 hour  Intake 2045.9 ml  Output -  Net 2045.9 ml    Labs: Recent Labs    03/28/19 0300 03/29/19 0311  HGB 10.7* 10.4*   Recent Labs    03/28/19 0300 03/29/19 0311  WBC 6.6 6.3  RBC 3.67* 3.66*  HCT 34.5* 34.6*  PLT 257 259   Recent Labs    03/28/19 0300 03/29/19 0311  NA 141 142  K 4.0 3.8  CL 109 107  CO2 23 28  BUN 14 14  CREATININE 0.57 0.59  GLUCOSE 114* 78  CALCIUM 8.3* 8.9   Exam: General - Patient is Alert and Oriented Extremity - Neurologically intact Neurovascular intact Sensation intact distally Dorsiflexion/Plantar flexion intact Dressing/Incision - clean, dry, no drainage Motor Function - intact, moving foot and toes well on exam.   Past Medical History:  Diagnosis Date  . ADHD (attention deficit hyperactivity disorder)   . Anxiety   . Bipolar disorder ([place])   . Cervical disc disease   . Colon polyps    adenomatous  . Depression   . Hypertension   . Iron deficiency   . Kidney disease    renal angiomyolipomas  . Liver cyst   . Melanosis   . Tuberous sclerosis (HCC)     Assessment/Plan: 2 Days Post-Op Procedure(s) (LRB):  TOTAL KNEE ARTHROPLASTY (Right) Principal Problem:   OA (osteoarthritis) of knee Active Problems:   Osteoarthritis of right knee  Estimated body mass index is 30.27 kg/m as calculated from the following:   Height as of this encounter: 5\' 9"  (1.753 m).   Weight as of this encounter: 93 kg. Up with therapy D/C IV fluids  DVT Prophylaxis - Aspirin Weight-bearing as tolerated  Plan for discharge to home after one session of therapy this AM. Scheduled for outpatient physical therapy at [facility]. Follow-up in the office in 2 weeks.   Theresa Duty, PA-C Orthopedic Surgery 03/29/2019, 8:05 AM

## 2019-03-29 NOTE — Plan of Care (Signed)
  Problem: Education: Goal: Knowledge of General Education information will improve Description: Including pain rating scale, medication(s)/side effects and non-pharmacologic comfort measures Outcome: Adequate for Discharge   Problem: Health Behavior/Discharge Planning: Goal: Ability to manage health-related needs will improve Outcome: Adequate for Discharge   Problem: Clinical Measurements: Goal: Ability to maintain clinical measurements within normal limits will improve Outcome: Adequate for Discharge Goal: Will remain free from infection Outcome: Adequate for Discharge Goal: Cardiovascular complication will be avoided Outcome: Adequate for Discharge   Problem: Activity: Goal: Risk for activity intolerance will decrease Outcome: Adequate for Discharge   Problem: Nutrition: Goal: Adequate nutrition will be maintained Outcome: Adequate for Discharge   Problem: Coping: Goal: Level of anxiety will decrease Outcome: Adequate for Discharge   Problem: Elimination: Goal: Will not experience complications related to bowel motility Outcome: Adequate for Discharge Goal: Will not experience complications related to urinary retention Outcome: Adequate for Discharge   Problem: Pain Managment: Goal: General experience of comfort will improve Outcome: Adequate for Discharge   Problem: Safety: Goal: Ability to remain free from injury will improve Outcome: Adequate for Discharge   Problem: Skin Integrity: Goal: Risk for impaired skin integrity will decrease Outcome: Adequate for Discharge  Home with husband. Discharge teaching done. Written information given

## 2019-03-29 NOTE — Progress Notes (Signed)
Physical Therapy Treatment Patient Details Name: Melissa Ochoa MRN: [id number] DOB: [birth year] Today's Date: 03/29/2019    History of Present Illness Pt is a 53 year old female s/p R TKA    PT Comments    Pt ambulated in hallway and practiced safe stair technique.  Pt also performed LE exercises.  Pt unable to perform SLR and most exercises required active assist so encouraged pt to maintain KI with standing and mobility with d/c home.  Pt educated on don/doff KI.  Pt provided with HEP and stair handouts.  Pt feels ready for d/c home today and plans to f/u with OP PT on Monday.   Follow Up Recommendations  Follow surgeon's recommendation for DC plan and follow-up therapies     Equipment Recommendations  None recommended by PT    Recommendations for Other Services       Precautions / Restrictions Precautions Precautions: Knee Required Braces or Orthoses: Knee Immobilizer - Right Restrictions Weight Bearing Restrictions: No Other Position/Activity Restrictions: WBAT    Mobility  Bed Mobility               General bed mobility comments: pt up in recliner on arrival  Transfers Overall transfer level: Needs assistance Equipment used: Rolling walker (2 wheeled) Transfers: Sit to/from Stand Sit to Stand: Min guard;Supervision         General transfer comment: verbal cues for UE and LE positioning  Ambulation/Gait Ambulation/Gait assistance: Min guard;Supervision Gait Distance (Feet): 150 Feet Assistive device: Rolling walker (2 wheeled) Gait Pattern/deviations: Step-to pattern;Decreased stance time - right;Antalgic Gait velocity: decr   General Gait Details: verbal cues for sequence, RW positioning, step length   Stairs Stairs: Yes Stairs assistance: Min guard Stair Management: Backwards;Step to pattern;With walker Number of Stairs: 3 General stair comments: pt attempting to ascend with only cane and appeared very unsafe so educated on backwards with  RW, pt aware spouse needs to hold RW for safety, cues for sequence and safe technique, pt reports understanding   Wheelchair Mobility    Modified Rankin (Stroke Patients Only)       Balance                                            Cognition Arousal/Alertness: Awake/alert Behavior During Therapy: WFL for tasks assessed/performed Overall Cognitive Status: Within Functional Limits for tasks assessed                                        Exercises Total Joint Exercises Ankle Circles/Pumps: AROM;Both;10 reps Quad Sets: AROM;Both;10 reps Short Arc Quad: 10 reps;Right;AAROM Heel Slides: AAROM;Right;10 reps Hip ABduction/ADduction: AROM;Right;10 reps Straight Leg Raises: AAROM;Right;10 reps    General Comments        Pertinent Vitals/Pain Pain Assessment: 0-10 Pain Score: 5  Pain Location: R TKA Pain Descriptors / Indicators: Sore;Aching;Tightness;Spasm Pain Intervention(s): Limited activity within patient's tolerance;Repositioned;Premedicated before session    Home Living                      Prior Function            PT Goals (current goals can now be found in the care plan section) Progress towards PT goals: Progressing toward goals    Frequency    7X/week  PT Plan Current plan remains appropriate    Co-evaluation              AM-PAC PT "6 Clicks" Mobility   Outcome Measure  Help needed turning from your back to your side while in a flat bed without using bedrails?: A Little Help needed moving from lying on your back to sitting on the side of a flat bed without using bedrails?: A Little Help needed moving to and from a bed to a chair (including a wheelchair)?: A Little Help needed standing up from a chair using your arms (e.g., wheelchair or bedside chair)?: A Little Help needed to walk in hospital room?: A Little Help needed climbing 3-5 steps with a railing? : A Little 6 Click Score: 18     End of Session Equipment Utilized During Treatment: Gait belt;Right knee immobilizer Activity Tolerance: Patient tolerated treatment well Patient left: in chair;with call bell/phone within reach Nurse Communication: Mobility status PT Visit Diagnosis: Difficulty in walking, not elsewhere classified (R26.2)     Time: 8242-3536 PT Time Calculation (min) (ACUTE ONLY): 26 min  Charges:  $Gait Training: 8-22 mins $Therapeutic Exercise: 8-22 mins                    Carmelia Bake, PT, DPT Acute Rehabilitation Services Office: [phone] Pager: [place] E 03/29/2019, 11:58 AM

## 2019-04-03 ENCOUNTER — Telehealth (HOSPITAL_COMMUNITY): Payer: Self-pay

## 2019-04-03 DIAGNOSIS — M25661 Stiffness of right knee, not elsewhere classified: Secondary | ICD-10-CM | POA: Diagnosis not present

## 2019-04-03 NOTE — Telephone Encounter (Signed)
Patient is calling for a refill on her Adderall, she has an appointment this Thursday, but will be out tomorrow. Pharmacy in the chart is correct.

## 2019-04-06 ENCOUNTER — Other Ambulatory Visit: Payer: Self-pay

## 2019-04-06 ENCOUNTER — Encounter (HOSPITAL_COMMUNITY): Payer: Self-pay | Admitting: Psychiatry

## 2019-04-06 ENCOUNTER — Ambulatory Visit (INDEPENDENT_AMBULATORY_CARE_PROVIDER_SITE_OTHER): Payer: Self-pay | Admitting: Psychiatry

## 2019-04-06 DIAGNOSIS — F900 Attention-deficit hyperactivity disorder, predominantly inattentive type: Secondary | ICD-10-CM

## 2019-04-06 DIAGNOSIS — M25661 Stiffness of right knee, not elsewhere classified: Secondary | ICD-10-CM | POA: Diagnosis not present

## 2019-04-06 DIAGNOSIS — F411 Generalized anxiety disorder: Secondary | ICD-10-CM

## 2019-04-06 DIAGNOSIS — F3130 Bipolar disorder, current episode depressed, mild or moderate severity, unspecified: Secondary | ICD-10-CM

## 2019-04-06 MED ORDER — AMPHETAMINE-DEXTROAMPHETAMINE 30 MG PO TABS: 30.0000 mg | ORAL_TABLET | Freq: Two times a day (BID) | ORAL | 0 refills | Status: DC

## 2019-04-06 MED ORDER — SERTRALINE HCL 100 MG PO TABS: ORAL_TABLET | ORAL | 0 refills | Status: DC

## 2019-04-06 MED ORDER — ALPRAZOLAM 1 MG PO TABS: 1.0000 mg | ORAL_TABLET | Freq: Four times a day (QID) | ORAL | 2 refills | Status: DC | PRN

## 2019-04-06 MED ORDER — QUETIAPINE FUMARATE 300 MG PO TABS: ORAL_TABLET | ORAL | 0 refills | Status: DC

## 2019-04-06 MED ORDER — AMPHETAMINE-DEXTROAMPHETAMINE 30 MG PO TABS: 30.0000 mg | ORAL_TABLET | Freq: Two times a day (BID) | ORAL | 0 refills | Status: DC | PRN

## 2019-04-06 MED ORDER — LAMOTRIGINE 100 MG PO TABS: ORAL_TABLET | ORAL | 0 refills | Status: DC

## 2019-04-06 NOTE — Progress Notes (Signed)
Virtual Visit via Telephone Note  I connected with Melissa Ochoa on 04/06/19 at 11:45 AM EDT by telephone and verified that I am speaking with the correct person using two identifiers.  Location: Patient: home Provider: office   I discussed the limitations, risks, security and privacy concerns of performing an evaluation and management service by telephone and the availability of in person appointments. I also discussed with the patient that there may be a patient responsible charge related to this service. The patient expressed understanding and agreed to proceed.   History of Present Illness: "I have had better days". She recently had total knee replacement on June 29th. She is doing ok. Her depression is a little better now that she had surgery. She is no longer sleeping until noon. Melissa Ochoa has something to get up for- exercises, being around her family. Sleep at night is good. Pt denies SI/HI but continues to experience passive thoughts of death related to her health. She feels bad about the burden she puts on others. Pt denies manic and hypomanic like symptoms. She has been irritable some. Melissa Ochoa states a lot has happened in the last 6 months and is just wanting the stress to go away. Anxiety is ongoing. "I hae anxiety about everything". She can control it sometimes.  She is trying to help herself as much as she can. Melissa Ochoa denies AVH. ADHD is ok with Adderall. Adderall gives her energy and motivation    Observations/Objective: I spoke with Melissa Ochoa on the phone.  Pt was calm, pleasant and cooperative.  Pt was engaged in the conversation and answered questions appropriately.  Speech was clear and coherent with normal rate, tone and volume.  Mood is mildly depressed and anxious, affect is brighter than at previous visits. Thought processes are coherent and intact.  Thought content is logical.  Pt denies SI/HI but reports chronic passive thoughts of death.   Pt denies auditory and  visual hallucinations and did not appear to be responding to internal stimuli.  Memory and concentration are good.  Fund of knowledge and use of language are average.  Insight and judgment are fair.  I am unable to comment on psychomotor activity, general appearance, hygiene, or eye contact as I was unable to physically see the patient on the phone.   Assessment and Plan: Bipolar I d/o- depressed; GAD; ADHD-inattentive type; r/o OCD  Zoloft 200mg  po qD Seroquel 300mg  qHS Lamictal 100mg  po BID  Adderall 30mg  po BID Xanax 1mg  QID prn  Reviewed labs done 03/29/2019- BMP WNL, CBC shows Hb and MCV decreased  Pt denies SI but does report ongoing passive thoughts of death.  She is at an acute low risk for suicide as she wants to live for her children and granddaughter. Patient told to call clinic if any problems occur. Patient advised to go to ER if they should develop SI/HI, side effects, or if symptoms worsen. Has crisis numbers to call if needed. Pt verbalized understanding.    Follow Up Instructions: In 2-3 months or sooner if needed   I discussed the assessment and treatment plan with the patient. The patient was provided an opportunity to ask questions and all were answered. The patient agreed with the plan and demonstrated an understanding of the instructions.   The patient was advised to call back or seek an in-person evaluation if the symptoms worsen or if the condition fails to improve as anticipated.  I provided 20 minutes of non-face-to-face time during this encounter.  Charlcie Cradle, MD

## 2019-04-10 DIAGNOSIS — M25661 Stiffness of right knee, not elsewhere classified: Secondary | ICD-10-CM | POA: Diagnosis not present

## 2019-04-18 DIAGNOSIS — M25661 Stiffness of right knee, not elsewhere classified: Secondary | ICD-10-CM | POA: Diagnosis not present

## 2019-04-21 DIAGNOSIS — M25661 Stiffness of right knee, not elsewhere classified: Secondary | ICD-10-CM | POA: Diagnosis not present

## 2019-04-24 DIAGNOSIS — M25661 Stiffness of right knee, not elsewhere classified: Secondary | ICD-10-CM | POA: Diagnosis not present

## 2019-04-26 DIAGNOSIS — M25661 Stiffness of right knee, not elsewhere classified: Secondary | ICD-10-CM | POA: Diagnosis not present

## 2019-05-02 DIAGNOSIS — Z471 Aftercare following joint replacement surgery: Secondary | ICD-10-CM | POA: Diagnosis not present

## 2019-05-02 DIAGNOSIS — Z96651 Presence of right artificial knee joint: Secondary | ICD-10-CM | POA: Diagnosis not present

## 2019-05-15 ENCOUNTER — Encounter: Payer: Self-pay | Admitting: Physician Assistant

## 2019-05-15 ENCOUNTER — Telehealth: Payer: Medicare Other | Admitting: Physician Assistant

## 2019-05-15 DIAGNOSIS — R197 Diarrhea, unspecified: Secondary | ICD-10-CM | POA: Diagnosis not present

## 2019-05-15 NOTE — Progress Notes (Signed)
Based on what you shared with me, I feel your condition warrants further evaluation and I recommend that you be seen for a face to face office visit.  NOTE: If you entered your credit card information for this eVisit, you will not be charged. You may see a "hold" on your card for the $35 but that hold will drop off and you will not have a charge processed.  Ms. Kirchner,  Due to persistent diarrhea, felling dehydrated, and decreased urinary output, it is recommended that you have a face to face evaluation to exclude dehydration and any causes such as C. Diff bacteria due to your recent hospitalization   If you are having a true medical emergency please call 911.     For an urgent face to face visit, [place] has five urgent care centers for your convenience:   [email or web address] to reserve your spot online an avoid wait times  [place], [place], [place] [address] *Just off [address], across the road from [place] hours of operation: Monday-Friday, 12 PM to 6 PM  Closed Saturday & Sunday    . [facility]    [phone]                  Get Driving Directions  [address], [place] [address] . 10 am to 8 pm Monday-Friday . 12 pm to 8 pm Saturday-Sunday   . [facility] at [place]                  Get Driving Directions  [address], [place], [place] [address] . 8 am to 8 pm Monday-Friday . 9 am to 6 pm Saturday . 11 am to 6 pm Sunday   . [facility] at [place]                  Get Driving Directions   [address] [place], [place] [address] . 8 am to 8 pm Monday-Friday . 8 am to 4 pm Saturday-Sunday    . [facility] at [place]                    Get Driving Directions  [phone]  [address] [place], [place]  [address]  . Monday-Friday, 12 PM to 6 PM    Your e-visit answers were reviewed by a board certified advanced clinical practitioner to complete your personal care plan.  Thank you for using e-Visits. I spent 5-10 minutes on review and completion of this note- Lacy Duverney [facility]

## 2019-05-25 ENCOUNTER — Ambulatory Visit (INDEPENDENT_AMBULATORY_CARE_PROVIDER_SITE_OTHER): Payer: Medicare Other | Admitting: Psychiatry

## 2019-05-25 ENCOUNTER — Other Ambulatory Visit: Payer: Self-pay

## 2019-05-25 ENCOUNTER — Encounter (HOSPITAL_COMMUNITY): Payer: Self-pay | Admitting: Psychiatry

## 2019-05-25 DIAGNOSIS — F411 Generalized anxiety disorder: Secondary | ICD-10-CM

## 2019-05-25 DIAGNOSIS — F3130 Bipolar disorder, current episode depressed, mild or moderate severity, unspecified: Secondary | ICD-10-CM

## 2019-05-25 DIAGNOSIS — F900 Attention-deficit hyperactivity disorder, predominantly inattentive type: Secondary | ICD-10-CM | POA: Diagnosis not present

## 2019-05-25 MED ORDER — QUETIAPINE FUMARATE 300 MG PO TABS: ORAL_TABLET | ORAL | 0 refills | Status: DC

## 2019-05-25 MED ORDER — SERTRALINE HCL 100 MG PO TABS: ORAL_TABLET | ORAL | 0 refills | Status: DC

## 2019-05-25 MED ORDER — AMPHETAMINE-DEXTROAMPHETAMINE 30 MG PO TABS: 30.0000 mg | ORAL_TABLET | Freq: Two times a day (BID) | ORAL | 0 refills | Status: DC

## 2019-05-25 MED ORDER — ALPRAZOLAM 1 MG PO TABS: 1.0000 mg | ORAL_TABLET | Freq: Four times a day (QID) | ORAL | 2 refills | Status: DC | PRN

## 2019-05-25 MED ORDER — AMPHETAMINE-DEXTROAMPHETAMINE 30 MG PO TABS: 30.0000 mg | ORAL_TABLET | Freq: Two times a day (BID) | ORAL | 0 refills | Status: DC | PRN

## 2019-05-25 MED ORDER — LAMOTRIGINE 100 MG PO TABS: ORAL_TABLET | ORAL | 0 refills | Status: DC

## 2019-05-25 NOTE — Progress Notes (Signed)
Virtual Visit via Telephone Note  I connected with Melissa Ochoa on 05/25/19 at  1:00 PM EDT by telephone and verified that I am speaking with the correct person using two identifiers.  Location: Patient: home Provider: office   I discussed the limitations, risks, security and privacy concerns of performing an evaluation and management service by telephone and the availability of in person appointments. I also discussed with the patient that there may be a patient responsible charge related to this service. The patient expressed understanding and agreed to proceed.   History of Present Illness: Pt had full knee placement in June. It was very painful and it made her feel very irritable. Pt tells me that she has had a number of stressors over the last few months. She has been overwhelmed and "it has been one thing after another and I feel like I can't it anymore. Everything happens to me. I can't figure out what I am doing wrong. But I will be alright. The baby makes me happy. She is my Musician". Pt states she promises she will be ok. She is lonely and wishes she had a female friend to talk to. "I am glad I got to talk to you to today. I feel better just hearing your voice. It makes you feel like you have value in someone else's eye". Melissa Ochoa reports she is trying to be organized and structured. She feels that will help a lot. Her lack of routine was resulting in her sleeping in bed until 11am most days. She does idle things around the house. Rarely Melissa Ochoa has feelings of increased energy and will start cleaning something in the house. She is setting goals for the day and week. She is not as productive as she used to be and doesn't feel as interested in things she used to like. Melissa Ochoa is going to try to move forward. She reports poor appetite due to the pain. She wasn't eating as much and has lost 35 lbs over several months. Pt is only socializing with immediate family. Pt denies active SI but  does have ongoing passive thoughts of death. She denies HI.     Observations/Objective: I spoke with Melissa Ochoa on the phone.  Pt was calm, pleasant and cooperative.  Pt was engaged in the conversation and answered questions appropriately.  Speech was clear and coherent with normal rate, tone and volume.  Mood is depressed and anxious, affect is congruent. Thought processes are coherent, goal oriented and intact.  Thought content is with ruminations.  Pt denies SI/HI.   Pt denies auditory and visual hallucinations and did not appear to be responding to internal stimuli.  Memory and concentration are good.  Fund of knowledge and use of language are average.  Insight and judgment are fair.  I am unable to comment on psychomotor activity, general appearance, hygiene, or eye contact as I was unable to physically see the patient on the phone.  Vital signs not available since interview conducted virtually.   I reviewed the information below on 05/25/2019 and have updated it. Assessment and Plan:  Bipolar I d/o- depressed; GAD; ADHD-inattentive type; r/o OCD   Zoloft 200mg  po qD Seroquel 300mg  qHS Lamictal 100mg  po BID  Adderall 30mg  po BID Xanax 1mg  QID prn  Pt does not want her meds changed today  Pt denies active SI but does report ongoing passive thoughts of death.  She is at an acute low risk for suicide as she wants to live  for her children and granddaughter. Patient told to call clinic if any problems occur. Patient advised to go to ER if they should develop SI/HI, side effects, or if symptoms worsen. Has crisis numbers to call if needed. Pt verbalized understanding.   Follow Up Instructions: In 2-3 months or sooner if needed   I discussed the assessment and treatment plan with the patient. The patient was provided an opportunity to ask questions and all were answered. The patient agreed with the plan and demonstrated an understanding of the instructions.   The patient was advised to  call back or seek an in-person evaluation if the symptoms worsen or if the condition fails to improve as anticipated.  I provided 25 minutes of non-face-to-face time during this encounter.   Charlcie Cradle, MD

## 2019-06-28 ENCOUNTER — Telehealth (HOSPITAL_COMMUNITY): Payer: Self-pay

## 2019-06-28 NOTE — Telephone Encounter (Signed)
Patient called requesting a refill on her Adderall 30mg . It looked like patient already had a refill on file so I called the pharmacy and they do. They are refilling it for the patient today. Thank you.

## 2019-07-27 ENCOUNTER — Other Ambulatory Visit: Payer: Self-pay

## 2019-07-27 ENCOUNTER — Ambulatory Visit (INDEPENDENT_AMBULATORY_CARE_PROVIDER_SITE_OTHER): Payer: Medicare Other | Admitting: Psychiatry

## 2019-07-27 DIAGNOSIS — Z5329 Procedure and treatment not carried out because of patient's decision for other reasons: Secondary | ICD-10-CM

## 2019-07-27 NOTE — Progress Notes (Signed)
I called the pt twice and there was no answer. I left a VM for patient to call back.

## 2019-08-10 ENCOUNTER — Ambulatory Visit (INDEPENDENT_AMBULATORY_CARE_PROVIDER_SITE_OTHER): Payer: Medicare Other | Admitting: Psychiatry

## 2019-08-10 ENCOUNTER — Other Ambulatory Visit: Payer: Self-pay

## 2019-08-10 DIAGNOSIS — F3130 Bipolar disorder, current episode depressed, mild or moderate severity, unspecified: Secondary | ICD-10-CM | POA: Diagnosis not present

## 2019-08-10 DIAGNOSIS — F900 Attention-deficit hyperactivity disorder, predominantly inattentive type: Secondary | ICD-10-CM | POA: Diagnosis not present

## 2019-08-10 DIAGNOSIS — F411 Generalized anxiety disorder: Secondary | ICD-10-CM

## 2019-08-10 MED ORDER — AMPHETAMINE-DEXTROAMPHETAMINE 30 MG PO TABS: 30.0000 mg | ORAL_TABLET | Freq: Two times a day (BID) | ORAL | 0 refills | Status: DC

## 2019-08-10 MED ORDER — ALPRAZOLAM 1 MG PO TABS: 1.0000 mg | ORAL_TABLET | Freq: Four times a day (QID) | ORAL | 2 refills | Status: DC | PRN

## 2019-08-10 MED ORDER — AMPHETAMINE-DEXTROAMPHETAMINE 30 MG PO TABS: 30.0000 mg | ORAL_TABLET | Freq: Two times a day (BID) | ORAL | 0 refills | Status: DC | PRN

## 2019-08-10 MED ORDER — LAMOTRIGINE 100 MG PO TABS: ORAL_TABLET | ORAL | 0 refills | Status: DC

## 2019-08-10 MED ORDER — SERTRALINE HCL 100 MG PO TABS: ORAL_TABLET | ORAL | 0 refills | Status: DC

## 2019-08-10 MED ORDER — QUETIAPINE FUMARATE 300 MG PO TABS: ORAL_TABLET | ORAL | 0 refills | Status: DC

## 2019-08-10 NOTE — Progress Notes (Signed)
  Virtual Visit via Telephone Note  I connected with Melissa Ochoa  on 08/10/19 at  3:30 PM EST by telephone and verified that I am speaking with the correct person using two identifiers.  Location: Patient: home Provider: office   I discussed the limitations, risks, security and privacy concerns of performing an evaluation and management service by telephone and the availability of in person appointments. I also discussed with the patient that there may be a patient responsible charge related to this service. The patient expressed understanding and agreed to proceed.   History of Present Illness: Pt was unable to speak during our scheduled appointment time. We spoke later during the day. "My life is just a wreck right now". Pt reports she is overwhelmed by stress. She has family issues going on and is concerned about environmental/social stressors. It is causing worsening depression and anxiety. She denies any manic and hypomanic like symptoms. The knee pain is ongoing and she is unable to do much physically. She denies SI/HI. "I will be ok".     Observations/Objective: I spoke with Melissa Ochoa on the phone.  Pt was calm, pleasant and cooperative.  Pt was engaged in the conversation and answered questions appropriately.  Speech was clear and coherent with normal rate, tone and volume.  Mood is depressed and anxious, affect is congruent. Thought processes are coherent, goal oriented and intact.  Thought content is with ruminations.  Pt denies SI/HI.   Pt denies auditory and visual hallucinations and did not appear to be responding to internal stimuli.  Memory and concentration are good.  Fund of knowledge and use of language are average.  Insight and judgment are fair.  I am unable to comment on psychomotor activity, general appearance, hygiene, or eye contact as I was unable to physically see the patient on the phone.  Vital signs not available since interview conducted virtually.      Assessment and Plan: Bipolar I d/o- depressed; GAD; ADHD-inattentive type; r/o OCD  Status of current symptoms: worsening depression and anxiety   Zoloft 200mg  po qD Seroquel 300mg  qHS Lamictal 100mg  po BID  Adderall 30mg  po BID Xanax 1mg  QID prn  Pt does not want her meds changed today   We talked about ways to engage in self care.   Pt denies active SI but does report ongoing passive thoughts of death.  She is at an acute low risk for suicide as she wants to live for her children and granddaughter. Patient told to call clinic if any problems occur. Patient advised to go to ER if they should develop SI/HI, side effects, or if symptoms worsen. Has crisis numbers to call if needed. Pt verbalized understanding.    Follow Up Instructions: In 8 weeks or sooner if needed   I discussed the assessment and treatment plan with the patient. The patient was provided an opportunity to ask questions and all were answered. The patient agreed with the plan and demonstrated an understanding of the instructions.   The patient was advised to call back or seek an in-person evaluation if the symptoms worsen or if the condition fails to improve as anticipated.  I provided 20 minutes of non-face-to-face time during this encounter.   Charlcie Cradle, MD

## 2019-08-21 DIAGNOSIS — N189 Chronic kidney disease, unspecified: Secondary | ICD-10-CM | POA: Diagnosis not present

## 2019-08-21 DIAGNOSIS — N182 Chronic kidney disease, stage 2 (mild): Secondary | ICD-10-CM | POA: Diagnosis not present

## 2019-08-23 ENCOUNTER — Encounter: Payer: Self-pay | Admitting: Internal Medicine

## 2019-09-11 DIAGNOSIS — I129 Hypertensive chronic kidney disease with stage 1 through stage 4 chronic kidney disease, or unspecified chronic kidney disease: Secondary | ICD-10-CM | POA: Diagnosis not present

## 2019-09-11 DIAGNOSIS — D631 Anemia in chronic kidney disease: Secondary | ICD-10-CM | POA: Diagnosis not present

## 2019-09-11 DIAGNOSIS — N182 Chronic kidney disease, stage 2 (mild): Secondary | ICD-10-CM | POA: Diagnosis not present

## 2019-09-11 DIAGNOSIS — Q851 Tuberous sclerosis: Secondary | ICD-10-CM | POA: Diagnosis not present

## 2019-09-27 ENCOUNTER — Ambulatory Visit: Payer: Medicare Other | Attending: Internal Medicine

## 2019-09-27 DIAGNOSIS — Z20828 Contact with and (suspected) exposure to other viral communicable diseases: Secondary | ICD-10-CM | POA: Diagnosis not present

## 2019-09-27 DIAGNOSIS — Z20822 Contact with and (suspected) exposure to covid-19: Secondary | ICD-10-CM

## 2019-09-28 LAB — NOVEL CORONAVIRUS, NAA: SARS-CoV-2, NAA: NOT DETECTED

## 2019-10-05 DIAGNOSIS — R197 Diarrhea, unspecified: Secondary | ICD-10-CM | POA: Diagnosis not present

## 2019-10-06 DIAGNOSIS — R197 Diarrhea, unspecified: Secondary | ICD-10-CM | POA: Diagnosis not present

## 2019-10-09 ENCOUNTER — Telehealth: Payer: Self-pay | Admitting: Internal Medicine

## 2019-10-09 NOTE — Telephone Encounter (Signed)
Pt called stating that she is having a lot of dark diarrhea and she is wanted to schedule a virtual visit. States she had some labs done at [facility] and she would like to make sure our office has these results at the time of the virtual visit. Let her know we can call and get results but I needed to check with Dr. Henrene Pastor to see if a virtual appt would be appropriate for her. Please advise.

## 2019-10-09 NOTE — Telephone Encounter (Signed)
Recommend in person visit. She is due for follow up colonoscopy. Could accomplish evaluation of current complaints and follow up colonoscopy (for surveillance and assessment of dark diarrhea). Get outside labs ahead of time. Thanks

## 2019-10-10 NOTE — Telephone Encounter (Signed)
Spoke with pt and she would really like to have an evisit to discuss her labs and her symptoms. States if she has to schedule another visit for the colon later she will do so. Please advise.

## 2019-10-10 NOTE — Telephone Encounter (Signed)
Pt is calling back about her lab results.  Advised her that Dr. Henrene Pastor is off and will not be back in the office until tomorrow.  She wanted to know why an assistant could not "give me answers"

## 2019-10-11 ENCOUNTER — Other Ambulatory Visit: Payer: Self-pay

## 2019-10-11 NOTE — Telephone Encounter (Signed)
Spoke with pt and let her know that we have received the blood work results but no stool results. This RN called the PCP office yesterday and the stool results were not back yet. Called again today and left another message for the PCP CMA. Encouraged pt to call her PCP office to see if she can find out any results as they were ordered by her PCP. Pt stated that she will call.

## 2019-10-11 NOTE — Telephone Encounter (Signed)
Put all relevant labs that she is alluding to in my inbox for review.  Thanks

## 2019-10-11 NOTE — Telephone Encounter (Signed)
I placed the blood work in your in box this morning. I called yesterday to try and get stool study results and have not gotten them yet. Called again and left a message for them to call me regarding the outstanding labs.

## 2019-10-11 NOTE — Telephone Encounter (Signed)
Patient called to follow up with results says she is very sick going to the bathroom about 35 times today

## 2019-10-17 NOTE — Telephone Encounter (Signed)
Only have limited blood work.  No stool studies.  If the patient wishes to be seen, please schedule an office appointment.  I am closing this phone encounter currently.  Thanks

## 2019-10-19 ENCOUNTER — Other Ambulatory Visit: Payer: Self-pay

## 2019-10-19 ENCOUNTER — Ambulatory Visit (HOSPITAL_COMMUNITY): Payer: Medicare Other | Admitting: Psychiatry

## 2019-10-19 ENCOUNTER — Encounter (HOSPITAL_COMMUNITY): Payer: Self-pay | Admitting: Psychiatry

## 2019-10-19 DIAGNOSIS — F411 Generalized anxiety disorder: Secondary | ICD-10-CM

## 2019-10-19 DIAGNOSIS — F900 Attention-deficit hyperactivity disorder, predominantly inattentive type: Secondary | ICD-10-CM

## 2019-10-19 DIAGNOSIS — F3130 Bipolar disorder, current episode depressed, mild or moderate severity, unspecified: Secondary | ICD-10-CM

## 2019-10-19 MED ORDER — QUETIAPINE FUMARATE 300 MG PO TABS: ORAL_TABLET | ORAL | 0 refills | Status: DC

## 2019-10-19 MED ORDER — AMPHETAMINE-DEXTROAMPHETAMINE 30 MG PO TABS: 30.0000 mg | ORAL_TABLET | Freq: Two times a day (BID) | ORAL | 0 refills | Status: DC

## 2019-10-19 MED ORDER — SERTRALINE HCL 100 MG PO TABS: ORAL_TABLET | ORAL | 0 refills | Status: DC

## 2019-10-19 MED ORDER — AMPHETAMINE-DEXTROAMPHETAMINE 30 MG PO TABS: 30.0000 mg | ORAL_TABLET | Freq: Two times a day (BID) | ORAL | 0 refills | Status: DC | PRN

## 2019-10-19 MED ORDER — ALPRAZOLAM 1 MG PO TABS: 1.0000 mg | ORAL_TABLET | Freq: Four times a day (QID) | ORAL | 2 refills | Status: DC | PRN

## 2019-10-19 MED ORDER — LAMOTRIGINE 100 MG PO TABS: ORAL_TABLET | ORAL | 0 refills | Status: DC

## 2019-10-19 NOTE — Progress Notes (Unsigned)
Virtual Visit via Telephone Note  I connected with Melissa Ochoa on 10/19/19 at  2:00 PM EST by telephone and verified that I am speaking with the correct person using two identifiers.  Location: Patient: home Provider: office   I discussed the limitations, risks, security and privacy concerns of performing an evaluation and management service by telephone and the availability of in person appointments. I also discussed with the patient that there may be a patient responsible charge related to this service. The patient expressed understanding and agreed to proceed.   History of Present Illness: ***   Observations/Objective:  General Appearance: {Appearance:22683}  Eye Contact:  {BHH EYE CONTACT:22684}  Speech:  {Speech:22685}  Volume:  {Volume (PAA):22686}  Mood:  {BHH MOOD:22306}  Affect:  {Affect (PAA):22687}  Thought Process:  {Thought Process (PAA):22688}  Orientation:  {BHH ORIENTATION (PAA):22689}  Thought Content:  {Thought Content:22690}  Suicidal Thoughts:  {ST/HT (PAA):22692}  Homicidal Thoughts:  {ST/HT (PAA):22692}  Memory:  {BHH MEMORY:22881}  Judgement:  {Judgement (PAA):22694}  Insight:  {Insight (PAA):22695}  Psychomotor Activity:  {Psychomotor (PAA):22696}  Concentration:  {Concentration:21399}  Recall:  {BHH GOOD/FAIR/POOR:22877}  Fund of Knowledge:  {BHH GOOD/FAIR/POOR:22877}  Language:  {BHH GOOD/FAIR/POOR:22877}  Akathisia:  {BHH YES OR NO:22294}  Handed:  {Handed:22697}  AIMS (if indicated):     Assets:  {Assets (PAA):22698}  ADL's:  {BHH XO:4411959  Cognition:  {chl bhh cognition:304700322}  Sleep:        Assessment and Plan: ***  Follow Up Instructions: In 6 weeks or sooner if needed   I discussed the assessment and treatment plan with the patient. The patient was provided an opportunity to ask questions and all were answered. The patient agreed with the plan and demonstrated an understanding of the instructions.   The patient was  advised to call back or seek an in-person evaluation if the symptoms worsen or if the condition fails to improve as anticipated.  I provided *** minutes of non-face-to-face time during this encounter.   Charlcie Cradle, MD

## 2019-10-31 DIAGNOSIS — H35371 Puckering of macula, right eye: Secondary | ICD-10-CM | POA: Diagnosis not present

## 2019-10-31 DIAGNOSIS — H43393 Other vitreous opacities, bilateral: Secondary | ICD-10-CM | POA: Diagnosis not present

## 2019-10-31 DIAGNOSIS — H43812 Vitreous degeneration, left eye: Secondary | ICD-10-CM | POA: Diagnosis not present

## 2019-11-30 ENCOUNTER — Other Ambulatory Visit: Payer: Self-pay

## 2019-11-30 ENCOUNTER — Ambulatory Visit (INDEPENDENT_AMBULATORY_CARE_PROVIDER_SITE_OTHER): Payer: Medicare Other | Admitting: Psychiatry

## 2019-11-30 ENCOUNTER — Encounter (HOSPITAL_COMMUNITY): Payer: Self-pay | Admitting: Psychiatry

## 2019-11-30 DIAGNOSIS — F3130 Bipolar disorder, current episode depressed, mild or moderate severity, unspecified: Secondary | ICD-10-CM | POA: Diagnosis not present

## 2019-11-30 DIAGNOSIS — F411 Generalized anxiety disorder: Secondary | ICD-10-CM

## 2019-11-30 DIAGNOSIS — F900 Attention-deficit hyperactivity disorder, predominantly inattentive type: Secondary | ICD-10-CM | POA: Diagnosis not present

## 2019-11-30 MED ORDER — AMPHETAMINE-DEXTROAMPHETAMINE 30 MG PO TABS: 30.0000 mg | ORAL_TABLET | Freq: Two times a day (BID) | ORAL | 0 refills | Status: DC | PRN

## 2019-11-30 MED ORDER — AMPHETAMINE-DEXTROAMPHETAMINE 30 MG PO TABS: 30.0000 mg | ORAL_TABLET | Freq: Two times a day (BID) | ORAL | 0 refills | Status: DC

## 2019-11-30 MED ORDER — SERTRALINE HCL 100 MG PO TABS: ORAL_TABLET | ORAL | 0 refills | Status: DC

## 2019-11-30 MED ORDER — QUETIAPINE FUMARATE 300 MG PO TABS: ORAL_TABLET | ORAL | 0 refills | Status: DC

## 2019-11-30 MED ORDER — LAMOTRIGINE 100 MG PO TABS: ORAL_TABLET | ORAL | 0 refills | Status: DC

## 2019-11-30 MED ORDER — ALPRAZOLAM 1 MG PO TABS: 1.0000 mg | ORAL_TABLET | Freq: Four times a day (QID) | ORAL | 2 refills | Status: DC | PRN

## 2019-11-30 NOTE — Progress Notes (Signed)
Virtual Visit via Telephone Note  I connected with Melissa Ochoa on 11/30/19 at  2:30 PM EST by telephone and verified that I am speaking with the correct person using two identifiers.  Location: Patient: home Provider: office   I discussed the limitations, risks, security and privacy concerns of performing an evaluation and management service by telephone and the availability of in person appointments. I also discussed with the patient that there may be a patient responsible charge related to this service. The patient expressed understanding and agreed to proceed.   History of Present Illness: Natara continues to experience a lot of physical pain. It holds her back from doing a lot. Her anxiety has significantly increased. She has a few stress induced panic attacks. Her memory is poor and she is easily overwhelmed. She is not able to multitask anymore. Her depression is ongoing. She denies SI/HI. Roneshia denies hypomanic and manic like symptoms. For self care she is taking baths and working her knee. She can come to terms with her situation and understanding it will not change. Kirk is just taking it day by day.   Observations/Objective:  General Appearance: unable to assess  Eye Contact:  unable to assess  Speech:  Clear and Coherent and Normal Rate  Volume:  Normal  Mood:  Anxious and Depressed  Affect:  Congruent  Thought Process:  Goal Directed, Linear and Descriptions of Associations: Intact  Orientation:  Full (Time, Place, and Person)  Thought Content:  Logical  Suicidal Thoughts:  No  Homicidal Thoughts:  No  Memory:  Immediate;   Fair  Judgement:  Good  Insight:  Good  Psychomotor Activity: unable to assess  Concentration:  Concentration: Fair  Recall:  [employer] of Knowledge:  Good  Language:  Good  Akathisia:  unable to assess  Handed:  Right  AIMS (if indicated):     Assets:  Communication Skills Desire for Improvement Financial  Resources/Insurance Housing Resilience Social Support Talents/Skills Transportation Vocational/Educational  ADL's:  unable to assess  Cognition:  WNL  Sleep:         Assessment and Plan:  Bipolar I d/o- depressed; GAD; ADHD-inattentive type; r/o OCD   Status of current symptoms: worsening anxiety, ongoing depression   Zoloft 200mg  po qD Seroquel 300mg  qHS Lamictal 100mg  po BID  Adderall 30mg  po BID Xanax 1mg  QID prn  Pt does not want her meds changed today     Pt denies active SI but does report ongoing passive thoughts of death.  She is at an acute low risk for suicide as she wants to live for her children and granddaughter. Patient told to call clinic if any problems occur. Patient advised to go to ER if they should develop SI/HI, side effects, or if symptoms worsen. Has crisis numbers to call if needed. Pt verbalized understanding.     Follow Up Instructions: In 2-3 months or sooner if needed   I discussed the assessment and treatment plan with the patient. The patient was provided an opportunity to ask questions and all were answered. The patient agreed with the plan and demonstrated an understanding of the instructions.   The patient was advised to call back or seek an in-person evaluation if the symptoms worsen or if the condition fails to improve as anticipated.  I provided 20 minutes of non-face-to-face time during this encounter.   Charlcie Cradle, MD

## 2020-01-02 ENCOUNTER — Other Ambulatory Visit: Payer: Self-pay | Admitting: Internal Medicine

## 2020-01-02 DIAGNOSIS — Z1231 Encounter for screening mammogram for malignant neoplasm of breast: Secondary | ICD-10-CM

## 2020-01-26 DIAGNOSIS — M25552 Pain in left hip: Secondary | ICD-10-CM | POA: Diagnosis not present

## 2020-01-26 DIAGNOSIS — Z96651 Presence of right artificial knee joint: Secondary | ICD-10-CM | POA: Diagnosis not present

## 2020-01-26 DIAGNOSIS — Z471 Aftercare following joint replacement surgery: Secondary | ICD-10-CM | POA: Diagnosis not present

## 2020-02-22 ENCOUNTER — Other Ambulatory Visit: Payer: Self-pay

## 2020-02-22 ENCOUNTER — Telehealth (HOSPITAL_COMMUNITY): Payer: Self-pay | Admitting: Psychiatry

## 2020-02-22 ENCOUNTER — Telehealth (HOSPITAL_COMMUNITY): Payer: Medicare Other | Admitting: Psychiatry

## 2020-02-22 NOTE — Telephone Encounter (Signed)
Due to 2+ hrs of Epic failure I was unable to contact the patient during the scheduled time. I was not able to reach her later. I will have staff reach out to reschedule her appointment.

## 2020-02-25 ENCOUNTER — Other Ambulatory Visit (HOSPITAL_COMMUNITY): Payer: Self-pay | Admitting: Psychiatry

## 2020-02-25 DIAGNOSIS — F411 Generalized anxiety disorder: Secondary | ICD-10-CM

## 2020-02-29 ENCOUNTER — Telehealth (HOSPITAL_COMMUNITY): Payer: Self-pay | Admitting: *Deleted

## 2020-02-29 NOTE — Telephone Encounter (Signed)
Pt called requesting a refill of the Xanax 1mg  last written on 11/30/19 with 2 refills. Pt has an upcoming appointment  on 03/07/20. Please review and advise.

## 2020-02-29 NOTE — Telephone Encounter (Signed)
done

## 2020-03-05 DIAGNOSIS — Z20828 Contact with and (suspected) exposure to other viral communicable diseases: Secondary | ICD-10-CM | POA: Diagnosis not present

## 2020-03-05 DIAGNOSIS — J069 Acute upper respiratory infection, unspecified: Secondary | ICD-10-CM | POA: Diagnosis not present

## 2020-03-07 ENCOUNTER — Telehealth (HOSPITAL_COMMUNITY): Payer: Medicare Other | Admitting: Psychiatry

## 2020-03-11 DIAGNOSIS — J181 Lobar pneumonia, unspecified organism: Secondary | ICD-10-CM | POA: Diagnosis not present

## 2020-03-11 DIAGNOSIS — R05 Cough: Secondary | ICD-10-CM | POA: Diagnosis not present

## 2020-04-02 ENCOUNTER — Other Ambulatory Visit (HOSPITAL_COMMUNITY): Payer: Self-pay | Admitting: Psychiatry

## 2020-04-02 DIAGNOSIS — F411 Generalized anxiety disorder: Secondary | ICD-10-CM

## 2020-04-05 DIAGNOSIS — J181 Lobar pneumonia, unspecified organism: Secondary | ICD-10-CM | POA: Diagnosis not present

## 2020-04-11 ENCOUNTER — Other Ambulatory Visit: Payer: Self-pay

## 2020-04-11 ENCOUNTER — Telehealth (HOSPITAL_COMMUNITY): Payer: Medicare Other | Admitting: Psychiatry

## 2020-04-11 ENCOUNTER — Encounter (HOSPITAL_COMMUNITY): Payer: Self-pay | Admitting: Psychiatry

## 2020-04-11 DIAGNOSIS — F900 Attention-deficit hyperactivity disorder, predominantly inattentive type: Secondary | ICD-10-CM

## 2020-04-11 DIAGNOSIS — F3130 Bipolar disorder, current episode depressed, mild or moderate severity, unspecified: Secondary | ICD-10-CM

## 2020-04-11 DIAGNOSIS — F411 Generalized anxiety disorder: Secondary | ICD-10-CM

## 2020-04-11 MED ORDER — ALPRAZOLAM 1 MG PO TABS: ORAL_TABLET | ORAL | 0 refills | Status: DC

## 2020-04-11 MED ORDER — AMPHETAMINE-DEXTROAMPHETAMINE 30 MG PO TABS: 30.0000 mg | ORAL_TABLET | Freq: Two times a day (BID) | ORAL | 0 refills | Status: DC

## 2020-04-11 MED ORDER — SERTRALINE HCL 100 MG PO TABS: ORAL_TABLET | ORAL | 0 refills | Status: DC

## 2020-04-11 MED ORDER — AMPHETAMINE-DEXTROAMPHETAMINE 30 MG PO TABS: 30.0000 mg | ORAL_TABLET | Freq: Two times a day (BID) | ORAL | 0 refills | Status: DC | PRN

## 2020-04-11 MED ORDER — QUETIAPINE FUMARATE 300 MG PO TABS: ORAL_TABLET | ORAL | 0 refills | Status: DC

## 2020-04-11 MED ORDER — LAMOTRIGINE 100 MG PO TABS: ORAL_TABLET | ORAL | 0 refills | Status: DC

## 2020-04-11 NOTE — Progress Notes (Unsigned)
Virtual Visit via Telephone Note  I connected with Melissa Ochoa on 04/11/20 at  2:45 PM EDT by telephone and verified that I am speaking with the correct person using two identifiers.  Location: Patient: home Provider: office   I discussed the limitations, risks, security and privacy concerns of performing an evaluation and management service by telephone and the availability of in person appointments. I also discussed with the patient that there may be a patient responsible charge related to this service. The patient expressed understanding and agreed to proceed.   History of Present Illness: Melissa Ochoa is slowly recovering from pneumonia while on vacation. She is sad because it seems like something is always happening to her health and she can't get away from it all. She is going to force herself to move forward. Melissa Ochoa's anxiety is overwhelming. It makes her irritable. She has racing thoughts and chest pain. The Xanax helps a lot.  The depression is always in the background. It is manageable and she feels it is not worse than usual. Her grand- daughter helps to bring her happiness. Her sleep has been fair over the last week. Melissa Ochoa denies any recent manic and hypomanic like symptoms. She continues to experience random, passive thoughts of death. She denies SI/HI. Adderall continues to be effective.    Observations/Objective:  General Appearance: unable to assess  Eye Contact:  unable to assess  Speech:  {Speech:22685}  Volume:  {Volume (PAA):22686}  Mood:  {BHH MOOD:22306}  Affect:  {Affect (PAA):22687}  Thought Process:  {Thought Process (PAA):22688}  Orientation:  {BHH ORIENTATION (PAA):22689}  Thought Content:  {Thought Content:22690}  Suicidal Thoughts:  {ST/HT (PAA):22692}  Homicidal Thoughts:  {ST/HT (PAA):22692}  Memory:  {BHH MEMORY:22881}  Judgement:  {Judgement (PAA):22694}  Insight:  {Insight (PAA):22695}  Psychomotor Activity: unable to assess  Concentration:   {Concentration:21399}  Recall:  {BHH GOOD/FAIR/POOR:22877}  Fund of Knowledge:  {BHH GOOD/FAIR/POOR:22877}  Language:  {BHH GOOD/FAIR/POOR:22877}  Akathisia:  unable to assess  Handed:  {Handed:22697}  AIMS (if indicated):     Assets:  {Assets (PAA):22698}  ADL's:  unable to assess  Cognition:  {chl bhh cognition:304700322}  Sleep:         I reviewed the information below on 04/11/20 and have updated it Assessment and Plan:  Bipolar I d/o- depressed; GAD; ADHD-inattentive type; r/o OCD   Status of current symptoms: ongoing   1.Zoloft 200mg  po qD 2. Seroquel 300mg  qHS 3. Lamictal 100mg  po BID  4. Adderall 30mg  po BID 5. Xanax 1mg  QID prn       Pt denies active SI but does report ongoing passive thoughts of death.  She is at an acute low risk for suicide as she wants to live for her children and granddaughter. Patient told to call clinic if any problems occur. Patient advised to go to ER if they should develop SI/HI, side effects, or if symptoms worsen. Has crisis numbers to call if needed. Pt verbalized understanding.     Follow Up Instructions: In 2-3 months or sooner if needed   I discussed the assessment and treatment plan with the patient. The patient was provided an opportunity to ask questions and all were answered. The patient agreed with the plan and demonstrated an understanding of the instructions.   The patient was advised to call back or seek an in-person evaluation if the symptoms worsen or if the condition fails to improve as anticipated.  I provided 20 minutes of non-face-to-face time during this encounter.  Charlcie Cradle, MD

## 2020-04-14 ENCOUNTER — Other Ambulatory Visit (HOSPITAL_COMMUNITY): Payer: Self-pay | Admitting: Psychiatry

## 2020-04-14 DIAGNOSIS — F411 Generalized anxiety disorder: Secondary | ICD-10-CM

## 2020-04-14 DIAGNOSIS — F3130 Bipolar disorder, current episode depressed, mild or moderate severity, unspecified: Secondary | ICD-10-CM

## 2020-04-16 DIAGNOSIS — R05 Cough: Secondary | ICD-10-CM | POA: Diagnosis not present

## 2020-04-16 DIAGNOSIS — J029 Acute pharyngitis, unspecified: Secondary | ICD-10-CM | POA: Diagnosis not present

## 2020-04-16 DIAGNOSIS — Z1152 Encounter for screening for COVID-19: Secondary | ICD-10-CM | POA: Diagnosis not present

## 2020-04-23 DIAGNOSIS — D631 Anemia in chronic kidney disease: Secondary | ICD-10-CM | POA: Diagnosis not present

## 2020-04-23 DIAGNOSIS — N182 Chronic kidney disease, stage 2 (mild): Secondary | ICD-10-CM | POA: Diagnosis not present

## 2020-04-23 DIAGNOSIS — Z905 Acquired absence of kidney: Secondary | ICD-10-CM | POA: Diagnosis not present

## 2020-04-23 DIAGNOSIS — N189 Chronic kidney disease, unspecified: Secondary | ICD-10-CM | POA: Diagnosis not present

## 2020-04-23 DIAGNOSIS — I129 Hypertensive chronic kidney disease with stage 1 through stage 4 chronic kidney disease, or unspecified chronic kidney disease: Secondary | ICD-10-CM | POA: Diagnosis not present

## 2020-06-03 ENCOUNTER — Other Ambulatory Visit (HOSPITAL_COMMUNITY): Payer: Self-pay | Admitting: Psychiatry

## 2020-06-03 DIAGNOSIS — F411 Generalized anxiety disorder: Secondary | ICD-10-CM

## 2020-06-06 ENCOUNTER — Telehealth (HOSPITAL_COMMUNITY): Payer: Self-pay | Admitting: *Deleted

## 2020-06-06 NOTE — Telephone Encounter (Signed)
Pharmacy fax received requesting refill of Alprazolam. It was last filled 8/12. She will run out 9/12. Her next apt is 10/7.  Please review and advise.

## 2020-06-07 ENCOUNTER — Telehealth (HOSPITAL_COMMUNITY): Payer: Self-pay | Admitting: *Deleted

## 2020-06-07 NOTE — Telephone Encounter (Signed)
Opened in error

## 2020-06-11 ENCOUNTER — Telehealth (HOSPITAL_COMMUNITY): Payer: Self-pay | Admitting: *Deleted

## 2020-06-11 NOTE — Telephone Encounter (Signed)
Pt called requesting refills on both Xanax and Adderra l last ordered on 04/11/20 with the Adderall to be filled 28 days after that. Pt has an appointment on 07/04/20. Please review.

## 2020-06-12 ENCOUNTER — Other Ambulatory Visit (HOSPITAL_COMMUNITY): Payer: Self-pay | Admitting: Psychiatry

## 2020-06-12 DIAGNOSIS — F411 Generalized anxiety disorder: Secondary | ICD-10-CM

## 2020-06-13 ENCOUNTER — Telehealth (HOSPITAL_COMMUNITY): Payer: Self-pay | Admitting: *Deleted

## 2020-06-13 NOTE — Telephone Encounter (Signed)
Patient has called again asking for Xanax refill. On her last appointment 7/15 she was not given any refills. Her next appt is 10/7. She is leaving town and needs refill ASAP. Please review.

## 2020-06-17 ENCOUNTER — Telehealth (HOSPITAL_COMMUNITY): Payer: Self-pay | Admitting: *Deleted

## 2020-06-17 ENCOUNTER — Other Ambulatory Visit (HOSPITAL_COMMUNITY): Payer: Self-pay | Admitting: Psychiatry

## 2020-06-17 DIAGNOSIS — F411 Generalized anxiety disorder: Secondary | ICD-10-CM

## 2020-06-17 DIAGNOSIS — F900 Attention-deficit hyperactivity disorder, predominantly inattentive type: Secondary | ICD-10-CM

## 2020-06-17 MED ORDER — ALPRAZOLAM 1 MG PO TABS: ORAL_TABLET | ORAL | 0 refills | Status: DC

## 2020-06-17 MED ORDER — AMPHETAMINE-DEXTROAMPHETAMINE 30 MG PO TABS: 30.0000 mg | ORAL_TABLET | Freq: Two times a day (BID) | ORAL | 0 refills | Status: DC | PRN

## 2020-06-17 NOTE — Telephone Encounter (Signed)
Patient called and stated that she needs Alprazolam script sent to pharmacy in [place] because it wasn't sent in before she left for [place]. She states it is urgent because she has had a panic attack since she has not had it in [place]. Please send script to the pharmacy added to her list. She states this is urgent.

## 2020-06-17 NOTE — Telephone Encounter (Signed)
Pharmacy sent request for refill of Alprazolam.  She has an appoint on 10/7.  Please review.

## 2020-06-19 ENCOUNTER — Other Ambulatory Visit (HOSPITAL_COMMUNITY): Payer: Self-pay | Admitting: *Deleted

## 2020-06-19 DIAGNOSIS — F411 Generalized anxiety disorder: Secondary | ICD-10-CM

## 2020-06-19 MED ORDER — ALPRAZOLAM 1 MG PO TABS: ORAL_TABLET | ORAL | 0 refills | Status: DC

## 2020-06-21 ENCOUNTER — Other Ambulatory Visit (HOSPITAL_COMMUNITY): Payer: Self-pay | Admitting: *Deleted

## 2020-06-21 DIAGNOSIS — F411 Generalized anxiety disorder: Secondary | ICD-10-CM

## 2020-06-28 ENCOUNTER — Telehealth (HOSPITAL_COMMUNITY): Payer: Self-pay

## 2020-06-28 NOTE — Telephone Encounter (Signed)
Patient called requesting a refill on her Alprazolam 1 mg. I spoke with [facility] in [place] and because she picked up 5 tablets at the [facility] in [place] when she was there, [employer] needs a new script. Patient stated that she's back in [place] now. Follow up scheduled for 07/04/20. Please review and advise. Thank you

## 2020-07-04 ENCOUNTER — Other Ambulatory Visit: Payer: Self-pay

## 2020-07-04 ENCOUNTER — Telehealth (HOSPITAL_COMMUNITY): Payer: Medicare Other | Admitting: Psychiatry

## 2020-07-04 DIAGNOSIS — F411 Generalized anxiety disorder: Secondary | ICD-10-CM

## 2020-07-04 DIAGNOSIS — F900 Attention-deficit hyperactivity disorder, predominantly inattentive type: Secondary | ICD-10-CM

## 2020-07-04 DIAGNOSIS — F3130 Bipolar disorder, current episode depressed, mild or moderate severity, unspecified: Secondary | ICD-10-CM

## 2020-07-04 MED ORDER — QUETIAPINE FUMARATE 300 MG PO TABS: ORAL_TABLET | ORAL | 0 refills | Status: DC

## 2020-07-04 MED ORDER — AMPHETAMINE-DEXTROAMPHETAMINE 30 MG PO TABS: 30.0000 mg | ORAL_TABLET | Freq: Two times a day (BID) | ORAL | 0 refills | Status: DC | PRN

## 2020-07-04 MED ORDER — LAMOTRIGINE 100 MG PO TABS: ORAL_TABLET | ORAL | 0 refills | Status: DC

## 2020-07-04 MED ORDER — AMPHETAMINE-DEXTROAMPHETAMINE 30 MG PO TABS: 30.0000 mg | ORAL_TABLET | Freq: Two times a day (BID) | ORAL | 0 refills | Status: DC

## 2020-07-04 MED ORDER — ALPRAZOLAM 1 MG PO TABS: ORAL_TABLET | ORAL | 3 refills | Status: DC

## 2020-07-04 MED ORDER — SERTRALINE HCL 100 MG PO TABS: ORAL_TABLET | ORAL | 0 refills | Status: DC

## 2020-07-04 NOTE — Progress Notes (Unsigned)
Virtual Visit via Telephone Note  I connected with Melissa Ochoa on 07/04/20 at  3:00 PM EDT by telephone and verified that I am speaking with the correct person using two identifiers.  Location: Patient: home Provider: office   I discussed the limitations, risks, security and privacy concerns of performing an evaluation and management service by telephone and the availability of in person appointments. I also discussed with the patient that there may be a patient responsible charge related to this service. The patient expressed understanding and agreed to proceed.   History of Present Illness: "It has been a rough few weeks". Melissa Ochoa needed a refill of Xanax when she was in [place]. It caused her to have panic attacks the first night. This lead her to feel depressed. She has been out of Xanax for 10 days. It was a really bad time. Melissa Ochoa was having racing thoughts and restlessness. After this she isolated herself, stopped talking and could not crying. Right now her anxiety is a bigger problem than her depression. She denies any manic like symptoms. Her sleep has been poor. For the last 2 weeks she is having trouble falling asleep (at least 30 min) and when she wakes up to go the bathroom she is unable to fall back asleep. Melissa Ochoa denies SI/HI. At times she is unable to focus even with the Adderall.    Observations/Objective:  General Appearance: unable to assess  Eye Contact:  unable to assess  Speech:  {Speech:22685}  Volume:  {Volume (PAA):22686}  Mood:  {BHH MOOD:22306}  Affect:  {Affect (PAA):22687}  Thought Process:  {Thought Process (PAA):22688}  Orientation:  {BHH ORIENTATION (PAA):22689}  Thought Content:  {Thought Content:22690}  Suicidal Thoughts:  {ST/HT (PAA):22692}  Homicidal Thoughts:  {ST/HT (PAA):22692}  Memory:  {BHH MEMORY:22881}  Judgement:  {Judgement (PAA):22694}  Insight:  {Insight (PAA):22695}  Psychomotor Activity: unable to assess  Concentration:   {Concentration:21399}  Recall:  {BHH GOOD/FAIR/POOR:22877}  Fund of Knowledge:  {BHH GOOD/FAIR/POOR:22877}  Language:  {BHH GOOD/FAIR/POOR:22877}  Akathisia:  unable to assess  Handed:  {Handed:22697}  AIMS (if indicated):     Assets:  {Assets (PAA):22698}  ADL's:  unable to assess  Cognition:  {chl bhh cognition:304700322}  Sleep:         Assessment and Plan: ***   Follow Up Instructions: In 2 months or sooner if needed   I discussed the assessment and treatment plan with the patient. The patient was provided an opportunity to ask questions and all were answered. The patient agreed with the plan and demonstrated an understanding of the instructions.   The patient was advised to call back or seek an in-person evaluation if the symptoms worsen or if the condition fails to improve as anticipated.  I provided 20 minutes of non-face-to-face time during this encounter.   Charlcie Cradle, MD

## 2020-07-13 ENCOUNTER — Other Ambulatory Visit (HOSPITAL_COMMUNITY): Payer: Self-pay | Admitting: Psychiatry

## 2020-07-13 DIAGNOSIS — F3130 Bipolar disorder, current episode depressed, mild or moderate severity, unspecified: Secondary | ICD-10-CM

## 2020-07-13 DIAGNOSIS — F411 Generalized anxiety disorder: Secondary | ICD-10-CM

## 2020-07-14 ENCOUNTER — Other Ambulatory Visit (HOSPITAL_COMMUNITY): Payer: Self-pay | Admitting: Psychiatry

## 2020-07-14 DIAGNOSIS — F3130 Bipolar disorder, current episode depressed, mild or moderate severity, unspecified: Secondary | ICD-10-CM

## 2020-07-14 DIAGNOSIS — F411 Generalized anxiety disorder: Secondary | ICD-10-CM

## 2020-09-12 ENCOUNTER — Telehealth (HOSPITAL_COMMUNITY): Payer: Medicare Other | Admitting: Psychiatry

## 2020-09-12 ENCOUNTER — Other Ambulatory Visit: Payer: Self-pay

## 2020-09-19 ENCOUNTER — Other Ambulatory Visit: Payer: Self-pay

## 2020-09-19 ENCOUNTER — Telehealth (HOSPITAL_COMMUNITY): Payer: Medicare Other | Admitting: Psychiatry

## 2020-09-19 DIAGNOSIS — F3130 Bipolar disorder, current episode depressed, mild or moderate severity, unspecified: Secondary | ICD-10-CM

## 2020-09-19 DIAGNOSIS — F900 Attention-deficit hyperactivity disorder, predominantly inattentive type: Secondary | ICD-10-CM

## 2020-09-19 DIAGNOSIS — F411 Generalized anxiety disorder: Secondary | ICD-10-CM

## 2020-09-19 NOTE — Progress Notes (Unsigned)
Virtual Visit via Telephone Note  I connected with Natallia V Kretzschmar on 09/19/20 at 11:00 AM EST by telephone and verified that I am speaking with the correct person using two identifiers.  Location: Patient: *** Provider: ***   I discussed the limitations, risks, security and privacy concerns of performing an evaluation and management service by telephone and the availability of in person appointments. I also discussed with the patient that there may be a patient responsible charge related to this service. The patient expressed understanding and agreed to proceed.   History of Present Illness:    Observations/Objective:   Assessment and Plan:   Follow Up Instructions:    I discussed the assessment and treatment plan with the patient. The patient was provided an opportunity to ask questions and all were answered. The patient agreed with the plan and demonstrated an understanding of the instructions.   The patient was advised to call back or seek an in-person evaluation if the symptoms worsen or if the condition fails to improve as anticipated.  I provided *** minutes of non-face-to-face time during this encounter.   Charlcie Cradle, MD

## 2020-09-20 MED ORDER — QUETIAPINE FUMARATE 300 MG PO TABS: 300.0000 mg | ORAL_TABLET | Freq: Every day | ORAL | 0 refills | Status: DC

## 2020-09-20 MED ORDER — ALPRAZOLAM 1 MG PO TABS: 1.0000 mg | ORAL_TABLET | Freq: Four times a day (QID) | ORAL | 0 refills | Status: DC | PRN

## 2020-09-20 MED ORDER — SERTRALINE HCL 100 MG PO TABS: 200.0000 mg | ORAL_TABLET | Freq: Every day | ORAL | 0 refills | Status: DC

## 2020-09-20 MED ORDER — AMPHETAMINE-DEXTROAMPHETAMINE 30 MG PO TABS: 30.0000 mg | ORAL_TABLET | Freq: Two times a day (BID) | ORAL | 0 refills | Status: DC

## 2020-09-20 MED ORDER — AMPHETAMINE-DEXTROAMPHETAMINE 30 MG PO TABS: 30.0000 mg | ORAL_TABLET | Freq: Two times a day (BID) | ORAL | 0 refills | Status: DC | PRN

## 2020-09-20 MED ORDER — LAMOTRIGINE 100 MG PO TABS: 100.0000 mg | ORAL_TABLET | Freq: Two times a day (BID) | ORAL | 0 refills | Status: DC

## 2020-09-25 DIAGNOSIS — Z20822 Contact with and (suspected) exposure to covid-19: Secondary | ICD-10-CM | POA: Diagnosis not present

## 2020-09-26 ENCOUNTER — Telehealth (HOSPITAL_COMMUNITY): Payer: Self-pay | Admitting: *Deleted

## 2020-09-26 ENCOUNTER — Telehealth (HOSPITAL_COMMUNITY): Payer: Self-pay

## 2020-09-26 NOTE — Telephone Encounter (Signed)
Yes that is fine. Please let the pharmacy know I approve it

## 2020-09-26 NOTE — Telephone Encounter (Signed)
Medication management - Pt left a message to request Dr. Michae Kava approve her pharmacy filling her Alprazolam approximately 4 days early as she is going out of town on 09/27/20 and will be out 09/29/20. Requests call to pharmacy to allow refill early and call back to let her know this has been done so she can pick up by 09/27/20.

## 2020-09-26 NOTE — Telephone Encounter (Signed)
Writer left VM message for pt regarding her requeat for fill on the Xanax. Last Rx sent on 09/20/20 #360 ( 90 days ). Pt says she is going out of the country for a [place] weeks and needs the medication. Writer unable to speak with staff at pt pharmacy, [facility] @ [address] To verify.

## 2020-09-30 ENCOUNTER — Other Ambulatory Visit (HOSPITAL_COMMUNITY): Payer: Self-pay | Admitting: Psychiatry

## 2020-09-30 ENCOUNTER — Telehealth (HOSPITAL_COMMUNITY): Payer: Self-pay | Admitting: *Deleted

## 2020-09-30 DIAGNOSIS — F411 Generalized anxiety disorder: Secondary | ICD-10-CM

## 2020-09-30 DIAGNOSIS — F3130 Bipolar disorder, current episode depressed, mild or moderate severity, unspecified: Secondary | ICD-10-CM

## 2020-09-30 NOTE — Telephone Encounter (Signed)
Late Entry for 09/16/20 1900: Clinical research associate spoke with pharmacist at pt [facility] @ [address] and [address]. In [place], regarding refilling Xanax 1mg  early as pt was going out of town the next morning and pt had called this office multiple times for refill. Pharmacist stated that he could see that she had filled Rx two weeks prior but couldn't see where it was filled, so this is why they would not fill "early". Writer spoke to pt who stated "ok".

## 2020-10-17 DIAGNOSIS — R519 Headache, unspecified: Secondary | ICD-10-CM | POA: Diagnosis not present

## 2020-10-17 DIAGNOSIS — I10 Essential (primary) hypertension: Secondary | ICD-10-CM | POA: Diagnosis not present

## 2020-10-17 DIAGNOSIS — J019 Acute sinusitis, unspecified: Secondary | ICD-10-CM | POA: Diagnosis not present

## 2020-10-17 DIAGNOSIS — H6502 Acute serous otitis media, left ear: Secondary | ICD-10-CM | POA: Diagnosis not present

## 2020-10-29 DIAGNOSIS — Z1231 Encounter for screening mammogram for malignant neoplasm of breast: Secondary | ICD-10-CM | POA: Diagnosis not present

## 2020-10-29 DIAGNOSIS — Z124 Encounter for screening for malignant neoplasm of cervix: Secondary | ICD-10-CM | POA: Diagnosis not present

## 2020-10-29 DIAGNOSIS — Z113 Encounter for screening for infections with a predominantly sexual mode of transmission: Secondary | ICD-10-CM | POA: Diagnosis not present

## 2020-10-29 DIAGNOSIS — Z01419 Encounter for gynecological examination (general) (routine) without abnormal findings: Secondary | ICD-10-CM | POA: Diagnosis not present

## 2020-10-29 DIAGNOSIS — Z6831 Body mass index (BMI) 31.0-31.9, adult: Secondary | ICD-10-CM | POA: Diagnosis not present

## 2020-10-29 DIAGNOSIS — Z1151 Encounter for screening for human papillomavirus (HPV): Secondary | ICD-10-CM | POA: Diagnosis not present

## 2020-10-31 DIAGNOSIS — H43813 Vitreous degeneration, bilateral: Secondary | ICD-10-CM | POA: Diagnosis not present

## 2020-10-31 DIAGNOSIS — H35371 Puckering of macula, right eye: Secondary | ICD-10-CM | POA: Diagnosis not present

## 2020-10-31 DIAGNOSIS — H40013 Open angle with borderline findings, low risk, bilateral: Secondary | ICD-10-CM | POA: Diagnosis not present

## 2020-11-04 DIAGNOSIS — N182 Chronic kidney disease, stage 2 (mild): Secondary | ICD-10-CM | POA: Diagnosis not present

## 2020-11-04 DIAGNOSIS — D631 Anemia in chronic kidney disease: Secondary | ICD-10-CM | POA: Diagnosis not present

## 2020-11-04 DIAGNOSIS — N189 Chronic kidney disease, unspecified: Secondary | ICD-10-CM | POA: Diagnosis not present

## 2020-11-04 DIAGNOSIS — I129 Hypertensive chronic kidney disease with stage 1 through stage 4 chronic kidney disease, or unspecified chronic kidney disease: Secondary | ICD-10-CM | POA: Diagnosis not present

## 2020-11-04 DIAGNOSIS — D179 Benign lipomatous neoplasm, unspecified: Secondary | ICD-10-CM | POA: Diagnosis not present

## 2020-11-05 ENCOUNTER — Other Ambulatory Visit (HOSPITAL_COMMUNITY): Payer: Self-pay | Admitting: Psychiatry

## 2020-11-05 DIAGNOSIS — F3130 Bipolar disorder, current episode depressed, mild or moderate severity, unspecified: Secondary | ICD-10-CM

## 2020-11-05 DIAGNOSIS — F411 Generalized anxiety disorder: Secondary | ICD-10-CM

## 2020-11-18 ENCOUNTER — Other Ambulatory Visit: Payer: Self-pay | Admitting: Nephrology

## 2020-11-18 ENCOUNTER — Other Ambulatory Visit (HOSPITAL_COMMUNITY): Payer: Self-pay | Admitting: Nephrology

## 2020-11-18 DIAGNOSIS — Q851 Tuberous sclerosis: Secondary | ICD-10-CM

## 2020-12-09 ENCOUNTER — Telehealth (HOSPITAL_COMMUNITY): Payer: Self-pay | Admitting: *Deleted

## 2020-12-09 NOTE — Telephone Encounter (Signed)
Pt called requesting refill of the Adderall. Pt has no upcoming appointments and looks like refill not due until next week FYI.

## 2020-12-12 ENCOUNTER — Other Ambulatory Visit (HOSPITAL_COMMUNITY): Payer: Self-pay | Admitting: Psychiatry

## 2020-12-12 ENCOUNTER — Telehealth (HOSPITAL_COMMUNITY): Payer: Self-pay | Admitting: *Deleted

## 2020-12-12 DIAGNOSIS — F411 Generalized anxiety disorder: Secondary | ICD-10-CM

## 2020-12-12 DIAGNOSIS — F900 Attention-deficit hyperactivity disorder, predominantly inattentive type: Secondary | ICD-10-CM

## 2020-12-12 MED ORDER — ALPRAZOLAM 1 MG PO TABS: 1.0000 mg | ORAL_TABLET | Freq: Four times a day (QID) | ORAL | 0 refills | Status: DC | PRN

## 2020-12-12 MED ORDER — AMPHETAMINE-DEXTROAMPHETAMINE 30 MG PO TABS: 30.0000 mg | ORAL_TABLET | Freq: Two times a day (BID) | ORAL | 0 refills | Status: DC | PRN

## 2020-12-12 NOTE — Telephone Encounter (Signed)
I have sent in a 30 day supply. For further refills she needs to schedule an appointment

## 2020-12-12 NOTE — Telephone Encounter (Signed)
Pt called requesting refill of Adderall. Pt was advised to make an appointment on previous call. No appointment on the books.

## 2020-12-13 ENCOUNTER — Other Ambulatory Visit (HOSPITAL_COMMUNITY): Payer: Self-pay | Admitting: Psychiatry

## 2020-12-13 ENCOUNTER — Telehealth (HOSPITAL_COMMUNITY): Payer: Self-pay | Admitting: *Deleted

## 2020-12-13 DIAGNOSIS — F900 Attention-deficit hyperactivity disorder, predominantly inattentive type: Secondary | ICD-10-CM

## 2020-12-13 MED ORDER — AMPHETAMINE-DEXTROAMPHETAMINE 30 MG PO TABS: 30.0000 mg | ORAL_TABLET | Freq: Two times a day (BID) | ORAL | 0 refills | Status: DC | PRN

## 2020-12-13 NOTE — Progress Notes (Signed)
Script sent to wrong pharmacy yesterday. Sent in new script after confirming pharmacy with patient.

## 2020-12-13 NOTE — Telephone Encounter (Signed)
done

## 2020-12-13 NOTE — Telephone Encounter (Signed)
Writer spoke with advising that refill of Adderall is only for 30 days as she needs an appointment. Writer offered, again, to transfer pt to front desk however pt declined. Pt to call later and schedule.

## 2020-12-19 ENCOUNTER — Other Ambulatory Visit (HOSPITAL_COMMUNITY): Payer: Self-pay | Admitting: Psychiatry

## 2020-12-19 DIAGNOSIS — F3130 Bipolar disorder, current episode depressed, mild or moderate severity, unspecified: Secondary | ICD-10-CM

## 2020-12-19 DIAGNOSIS — F411 Generalized anxiety disorder: Secondary | ICD-10-CM

## 2020-12-26 ENCOUNTER — Telehealth (HOSPITAL_BASED_OUTPATIENT_CLINIC_OR_DEPARTMENT_OTHER): Payer: Medicare Other | Admitting: Psychiatry

## 2020-12-26 ENCOUNTER — Other Ambulatory Visit: Payer: Self-pay

## 2020-12-26 DIAGNOSIS — F900 Attention-deficit hyperactivity disorder, predominantly inattentive type: Secondary | ICD-10-CM

## 2020-12-26 DIAGNOSIS — F3130 Bipolar disorder, current episode depressed, mild or moderate severity, unspecified: Secondary | ICD-10-CM

## 2020-12-26 DIAGNOSIS — F411 Generalized anxiety disorder: Secondary | ICD-10-CM

## 2020-12-26 MED ORDER — AMPHETAMINE-DEXTROAMPHETAMINE 30 MG PO TABS: 30.0000 mg | ORAL_TABLET | Freq: Two times a day (BID) | ORAL | 0 refills | Status: DC | PRN

## 2020-12-26 MED ORDER — QUETIAPINE FUMARATE 300 MG PO TABS
300.0000 mg | ORAL_TABLET | Freq: Every day | ORAL | 0 refills | Status: DC
Start: 2020-12-26 — End: 2021-03-13

## 2020-12-26 MED ORDER — SERTRALINE HCL 100 MG PO TABS: ORAL_TABLET | ORAL | 0 refills | Status: DC

## 2020-12-26 MED ORDER — LAMOTRIGINE 100 MG PO TABS: ORAL_TABLET | ORAL | 0 refills | Status: DC

## 2020-12-26 MED ORDER — AMPHETAMINE-DEXTROAMPHETAMINE 30 MG PO TABS: 30.0000 mg | ORAL_TABLET | Freq: Two times a day (BID) | ORAL | 0 refills | Status: DC

## 2020-12-26 MED ORDER — ALPRAZOLAM 1 MG PO TABS: 1.0000 mg | ORAL_TABLET | Freq: Four times a day (QID) | ORAL | 2 refills | Status: DC | PRN

## 2020-12-26 NOTE — Progress Notes (Signed)
Virtual Visit via Video Note  I connected with Melissa Ochoa on 12/26/20 at  4:15 PM EDT by a video enabled telemedicine application and verified that I am speaking with the correct person using two identifiers.  Location: Patient: home Provider: office   I discussed the limitations of evaluation and management by telemedicine and the availability of in person appointments. The patient expressed understanding and agreed to proceed.  History of Present Illness: Melissa Ochoa is very agitated. She is not sleeping well and is tired all day. She is not happy. She has had several angry outbursts and feels overwhelmed. She is having headaches and knee pain. Melissa Ochoa is having to use her cane more and she hates that. Melissa Ochoa has no energy to do anything. Her stress tolerance has decreased. Home life is ok. Her granddaughter and grandson are doing well. Overall Melissa Ochoa is feeling depressed, lonely and fed up.    Observations/Objective: Psychiatric Specialty Exam: ROS  There were no vitals taken for this visit.There is no height or weight on file to calculate BMI.  General Appearance: Neat and Well Groomed  Eye Contact:  Good  Speech:  Clear and Coherent and Normal Rate  Volume:  Normal  Mood:  Depressed  Affect:  Full Range  Thought Process:  Coherent and Descriptions of Associations: Circumstantial  Orientation:  Full (Time, Place, and Person)  Thought Content:  Rumination  Suicidal Thoughts:  No  Homicidal Thoughts:  No  Memory:  Immediate;   Good  Judgement:  Good  Insight:  Good  Psychomotor Activity:  Normal  Concentration:  Concentration: Good  Recall:  Good  Fund of Knowledge:  Good  Language:  Good  Akathisia:  No  Handed:  Right  AIMS (if indicated):     Assets:  Communication Skills Desire for Improvement Financial Resources/Insurance Housing Intimacy Resilience Social Support Talents/Skills Transportation Vocational/Educational  ADL's:  Intact  Cognition:  WNL   Sleep:        Assessment and Plan: 1. GAD (generalized anxiety disorder) - ALPRAZolam (XANAX) 1 MG tablet; Take 1 tablet (1 mg total) by mouth 4 (four) times daily as needed for anxiety.  Dispense: 120 tablet; Refill: 2 - QUEtiapine (SEROQUEL) 300 MG tablet; Take 1 tablet (300 mg total) by mouth at bedtime.  Dispense: 90 tablet; Refill: 0 - sertraline (ZOLOFT) 100 MG tablet; TAKE 2 TABLETS(200 MG) BY MOUTH DAILY  Dispense: 180 tablet; Refill: 0  2. Attention deficit hyperactivity disorder (ADHD), predominantly inattentive type - amphetamine-dextroamphetamine (ADDERALL) 30 MG tablet; Take 1 tablet by mouth 2 (two) times daily.  Dispense: 60 tablet; Refill: 0 - amphetamine-dextroamphetamine (ADDERALL) 30 MG tablet; Take 1 tablet by mouth 2 (two) times daily.  Dispense: 60 tablet; Refill: 0 - amphetamine-dextroamphetamine (ADDERALL) 30 MG tablet; Take 1 tablet by mouth 2 (two) times daily as needed (attention deficit).  Dispense: 60 tablet; Refill: 0  3. Bipolar I disorder, most recent episode depressed (HCC) - lamoTRIgine (LAMICTAL) 100 MG tablet; TAKE 1 TABLET(100 MG) BY MOUTH TWICE DAILY  Dispense: 180 tablet; Refill: 0 - QUEtiapine (SEROQUEL) 300 MG tablet; Take 1 tablet (300 mg total) by mouth at bedtime.  Dispense: 90 tablet; Refill: 0 - sertraline (ZOLOFT) 100 MG tablet; TAKE 2 TABLETS(200 MG) BY MOUTH DAILY  Dispense: 180 tablet; Refill: 0    Follow Up Instructions: In 2-3 months or sooner if needed   I discussed the assessment and treatment plan with the patient. The patient was provided an opportunity to ask questions and all were  answered. The patient agreed with the plan and demonstrated an understanding of the instructions.   The patient was advised to call back or seek an in-person evaluation if the symptoms worsen or if the condition fails to improve as anticipated.  I provided 17 minutes of non-face-to-face time during this encounter.   Charlcie Cradle, MD

## 2021-03-08 ENCOUNTER — Other Ambulatory Visit (HOSPITAL_COMMUNITY): Payer: Self-pay | Admitting: Psychiatry

## 2021-03-08 DIAGNOSIS — F3130 Bipolar disorder, current episode depressed, mild or moderate severity, unspecified: Secondary | ICD-10-CM

## 2021-03-08 DIAGNOSIS — F411 Generalized anxiety disorder: Secondary | ICD-10-CM

## 2021-03-13 ENCOUNTER — Other Ambulatory Visit: Payer: Self-pay

## 2021-03-13 ENCOUNTER — Telehealth (INDEPENDENT_AMBULATORY_CARE_PROVIDER_SITE_OTHER): Payer: Medicare Other | Admitting: Psychiatry

## 2021-03-13 DIAGNOSIS — F3130 Bipolar disorder, current episode depressed, mild or moderate severity, unspecified: Secondary | ICD-10-CM | POA: Diagnosis not present

## 2021-03-13 DIAGNOSIS — F411 Generalized anxiety disorder: Secondary | ICD-10-CM | POA: Diagnosis not present

## 2021-03-13 DIAGNOSIS — F900 Attention-deficit hyperactivity disorder, predominantly inattentive type: Secondary | ICD-10-CM

## 2021-03-13 MED ORDER — AMPHETAMINE-DEXTROAMPHETAMINE 30 MG PO TABS: 30.0000 mg | ORAL_TABLET | Freq: Two times a day (BID) | ORAL | 0 refills | Status: DC

## 2021-03-13 MED ORDER — SERTRALINE HCL 100 MG PO TABS: ORAL_TABLET | ORAL | 0 refills | Status: DC

## 2021-03-13 MED ORDER — QUETIAPINE FUMARATE 300 MG PO TABS: 300.0000 mg | ORAL_TABLET | Freq: Every day | ORAL | 0 refills | Status: DC

## 2021-03-13 MED ORDER — AMPHETAMINE-DEXTROAMPHETAMINE 30 MG PO TABS: 30.0000 mg | ORAL_TABLET | Freq: Two times a day (BID) | ORAL | 0 refills | Status: DC | PRN

## 2021-03-13 MED ORDER — LAMOTRIGINE 100 MG PO TABS
ORAL_TABLET | ORAL | 0 refills | Status: DC
Start: 2021-03-13 — End: 2021-06-05

## 2021-03-13 MED ORDER — ALPRAZOLAM 1 MG PO TABS: 1.0000 mg | ORAL_TABLET | Freq: Four times a day (QID) | ORAL | 2 refills | Status: DC | PRN

## 2021-03-13 NOTE — Progress Notes (Signed)
Virtual Visit via Telephone Note  I connected with Melissa Ochoa on 03/13/21 at  2:30 PM EDT by telephone and verified that I am speaking with the correct person using two identifiers.  Location: Patient: home Provider: office   I discussed the limitations, risks, security and privacy concerns of performing an evaluation and management service by telephone and the availability of in person appointments. I also discussed with the patient that there may be a patient responsible charge related to this service. The patient expressed understanding and agreed to proceed.   History of Present Illness: Melissa Ochoa has been stressed out lately. There has been a lot of things going on and it has made her tired. Melissa Ochoa is feeling overwhelmed. She is not sleeping well due to the amount of things going on. "I think I have been more manic than depressed". She has been crying and at times she is not motivated to do things. She feels manic because she is always in a rush. It makes her upset and irritable. At times she has thrown things and once slammed the bathroom door. She is really worried about her mom passing away. She is the last family member left who she is really close to. Her depression comes on suddenly. Over the last 2 weeks she has felt depressed about 3-4 days. She denies isolation and anhedonia. Spending time with her granddaughter makes her very happy. She denies SI/HI. Her ADHD is well controlled but she is always feeling hyper. Melissa Ochoa is having significant pain in her knee and states it never healed properly after her knee surgery. Her physical limitation really bother her.    Observations/Objective:  General Appearance: unable to assess  Eye Contact:  unable to assess  Speech:  Clear and Coherent and Normal Rate  Volume:  Normal  Mood:  Anxious and Depressed  Affect:  Full Range  Thought Process:  Coherent and Descriptions of Associations: Circumstantial  Orientation:  Full (Time, Place,  and Person)  Thought Content:  Rumination  Suicidal Thoughts:  No  Homicidal Thoughts:  No  Memory:  Immediate;   Good  Judgement:  Good  Insight:  Good  Psychomotor Activity: unable to assess  Concentration:  Concentration: Good  Recall:  Good  Fund of Knowledge:  Good  Language:  Good  Akathisia:  unable to assess  Handed:  Right  AIMS (if indicated):     Assets:  Communication Skills Desire for Improvement Financial Resources/Insurance Housing Intimacy Resilience Social Support Talents/Skills Transportation Vocational/Educational  ADL's:  unable to assess  Cognition:  WNL  Sleep:        Assessment and Plan: Depression screen [facility] 2/9 03/13/2021 04/17/2014  Decreased Interest 0 0  Down, Depressed, Hopeless 1 0  PHQ - 2 Score 1 0    Flowsheet Row Video Visit from 03/13/2021 in [facility] No Risk       1. GAD (generalized anxiety disorder) - ALPRAZolam (XANAX) 1 MG tablet; Take 1 tablet (1 mg total) by mouth 4 (four) times daily as needed for anxiety.  Dispense: 120 tablet; Refill: 2 - QUEtiapine (SEROQUEL) 300 MG tablet; Take 1 tablet (300 mg total) by mouth at bedtime.  Dispense: 90 tablet; Refill: 0 - sertraline (ZOLOFT) 100 MG tablet; TAKE 2 TABLETS(200 MG) BY MOUTH DAILY  Dispense: 180 tablet; Refill: 0  2. Attention deficit hyperactivity disorder (ADHD), predominantly inattentive type - amphetamine-dextroamphetamine (ADDERALL) 30 MG tablet; Take 1 tablet by mouth 2 (two) times  daily.  Dispense: 60 tablet; Refill: 0 - amphetamine-dextroamphetamine (ADDERALL) 30 MG tablet; Take 1 tablet by mouth 2 (two) times daily.  Dispense: 60 tablet; Refill: 0 - amphetamine-dextroamphetamine (ADDERALL) 30 MG tablet; Take 1 tablet by mouth 2 (two) times daily as needed (attention deficit).  Dispense: 60 tablet; Refill: 0  3. Bipolar I disorder, most recent episode depressed (HCC) - lamoTRIgine (LAMICTAL) 100 MG  tablet; TAKE 1 TABLET(100 MG) BY MOUTH TWICE DAILY  Dispense: 180 tablet; Refill: 0 - QUEtiapine (SEROQUEL) 300 MG tablet; Take 1 tablet (300 mg total) by mouth at bedtime.  Dispense: 90 tablet; Refill: 0 - sertraline (ZOLOFT) 100 MG tablet; TAKE 2 TABLETS(200 MG) BY MOUTH DAILY  Dispense: 180 tablet; Refill: 0  - order labs at next visit   Follow Up Instructions: In 2-3 months or sooner if needed   I discussed the assessment and treatment plan with the patient. The patient was provided an opportunity to ask questions and all were answered. The patient agreed with the plan and demonstrated an understanding of the instructions.   The patient was advised to call back or seek an in-person evaluation if the symptoms worsen or if the condition fails to improve as anticipated.  I provided 18 minutes of non-face-to-face time during this encounter.   Charlcie Cradle, MD

## 2021-05-30 ENCOUNTER — Telehealth (HOSPITAL_COMMUNITY): Payer: Self-pay | Admitting: *Deleted

## 2021-05-30 NOTE — Telephone Encounter (Signed)
Pt called stating that she attempted to fill her last Adderall 20 mg script, however [facility] [place] has not been able to get this product due to issues with manufactures. Pharmacy verifies. So pt is [facility] [address]. Writer will add to pt Snapshot. Pt has an upcoming appointment on 06/05/21.

## 2021-06-02 ENCOUNTER — Other Ambulatory Visit (HOSPITAL_COMMUNITY): Payer: Self-pay | Admitting: Psychiatry

## 2021-06-02 DIAGNOSIS — F411 Generalized anxiety disorder: Secondary | ICD-10-CM

## 2021-06-02 DIAGNOSIS — F3130 Bipolar disorder, current episode depressed, mild or moderate severity, unspecified: Secondary | ICD-10-CM

## 2021-06-05 ENCOUNTER — Other Ambulatory Visit: Payer: Self-pay

## 2021-06-05 ENCOUNTER — Encounter (HOSPITAL_COMMUNITY): Payer: Self-pay | Admitting: Psychiatry

## 2021-06-05 ENCOUNTER — Telehealth (INDEPENDENT_AMBULATORY_CARE_PROVIDER_SITE_OTHER): Payer: Medicare Other | Admitting: Psychiatry

## 2021-06-05 DIAGNOSIS — F411 Generalized anxiety disorder: Secondary | ICD-10-CM | POA: Diagnosis not present

## 2021-06-05 DIAGNOSIS — F900 Attention-deficit hyperactivity disorder, predominantly inattentive type: Secondary | ICD-10-CM | POA: Diagnosis not present

## 2021-06-05 DIAGNOSIS — F3130 Bipolar disorder, current episode depressed, mild or moderate severity, unspecified: Secondary | ICD-10-CM

## 2021-06-05 MED ORDER — AMPHETAMINE-DEXTROAMPHETAMINE 30 MG PO TABS: 30.0000 mg | ORAL_TABLET | Freq: Two times a day (BID) | ORAL | 0 refills | Status: DC

## 2021-06-05 MED ORDER — LAMOTRIGINE 100 MG PO TABS: ORAL_TABLET | ORAL | 0 refills | Status: DC

## 2021-06-05 MED ORDER — ALPRAZOLAM 1 MG PO TABS: 1.0000 mg | ORAL_TABLET | Freq: Four times a day (QID) | ORAL | 2 refills | Status: DC | PRN

## 2021-06-05 MED ORDER — AMPHETAMINE-DEXTROAMPHETAMINE 30 MG PO TABS
30.0000 mg | ORAL_TABLET | Freq: Two times a day (BID) | ORAL | 0 refills | Status: DC
Start: 2021-06-05 — End: 2021-07-03

## 2021-06-05 MED ORDER — QUETIAPINE FUMARATE 300 MG PO TABS: 300.0000 mg | ORAL_TABLET | Freq: Every day | ORAL | 0 refills | Status: DC

## 2021-06-05 MED ORDER — SERTRALINE HCL 100 MG PO TABS: ORAL_TABLET | ORAL | 0 refills | Status: DC

## 2021-06-05 MED ORDER — AMPHETAMINE-DEXTROAMPHETAMINE 30 MG PO TABS: 30.0000 mg | ORAL_TABLET | Freq: Two times a day (BID) | ORAL | 0 refills | Status: DC | PRN

## 2021-06-05 NOTE — Progress Notes (Signed)
Virtual Visit via Video Note  I connected with Melissa Ochoa on 06/05/21 at  3:00 PM EDT by a video enabled telemedicine application and verified that I am speaking with the correct person using two identifiers.  Location: Patient: home Provider: office   I discussed the limitations of evaluation and management by telemedicine and the availability of in person appointments. The patient expressed understanding and agreed to proceed.  History of Present Illness: Melissa Ochoa states she has been "a mess". She is depressed and overwhelmed. She is tired of the same thing over and over. Her husband gets her upset sometimes and then it is hard to bounce back from that. Her health is poor- the arthritis, general pain and knee pain, kidney problems are stressful. It is hard to do the things she wants to do. She can't swim or travel on her own. Melissa Ochoa has had a few outbursts. She is going on vacation on Friday and is worried about getting her meds refilled in time. Her Adderall was only partially filled due to limited stock. It takes a lot of preparation to get herself ready to vacation and that makes her depressed. Her sleep is fair. She tires easily when she does any activity. She denies SI/HI. Melissa Ochoa takes care of her granddaughter 2x/week. Melissa Ochoa rarely goes out and orders everything for delivery. Pt denies recent manic and hypomanic symptoms including periods of decreased need for sleep, increased energy, mood lability, impulsivity, FOI, and excessive spending. Melissa Ochoa is very worried about her younger daughter.     Observations/Objective: Psychiatric Specialty Exam: ROS  There were no vitals taken for this visit.There is no height or weight on file to calculate BMI.  General Appearance: Casual and Neat  Eye Contact:  Fair  Speech:  Clear and Coherent and Normal Rate  Volume:  Normal  Mood:  Anxious and Depressed  Affect:  Congruent  Thought Process:  Coherent and Descriptions of Associations:  Circumstantial  Orientation:  Full (Time, Place, and Person)  Thought Content:  Rumination  Suicidal Thoughts:  No  Homicidal Thoughts:  No  Memory:  Immediate;   Good  Judgement:  Good  Insight:  Good  Psychomotor Activity:  Normal  Concentration:  Concentration: Fair  Recall:  [place] of Knowledge:  Good  Language:  Good  Akathisia:  No  Handed:  Right  AIMS (if indicated):     Assets:  Communication Skills Desire for Improvement Financial Resources/Insurance Housing Resilience Social Support Talents/Skills Vocational/Educational  ADL's:  Intact  Cognition:  WNL  Sleep:        Assessment and Plan: 1. GAD (generalized anxiety disorder) - ALPRAZolam (XANAX) 1 MG tablet; Take 1 tablet (1 mg total) by mouth 4 (four) times daily as needed for anxiety.  Dispense: 120 tablet; Refill: 2 - QUEtiapine (SEROQUEL) 300 MG tablet; Take 1 tablet (300 mg total) by mouth at bedtime.  Dispense: 90 tablet; Refill: 0 - sertraline (ZOLOFT) 100 MG tablet; TAKE 2 TABLETS(200 MG) BY MOUTH DAILY  Dispense: 180 tablet; Refill: 0  2. Attention deficit hyperactivity disorder (ADHD), predominantly inattentive type - amphetamine-dextroamphetamine (ADDERALL) 30 MG tablet; Take 1 tablet by mouth 2 (two) times daily.  Dispense: 60 tablet; Refill: 0 - amphetamine-dextroamphetamine (ADDERALL) 30 MG tablet; Take 1 tablet by mouth 2 (two) times daily.  Dispense: 60 tablet; Refill: 0 - amphetamine-dextroamphetamine (ADDERALL) 30 MG tablet; Take 1 tablet by mouth 2 (two) times daily as needed (attention deficit).  Dispense: 60 tablet; Refill: 0  3. Bipolar  I disorder, most recent episode depressed (HCC) - lamoTRIgine (LAMICTAL) 100 MG tablet; TAKE 1 TABLET(100 MG) BY MOUTH TWICE DAILY  Dispense: 180 tablet; Refill: 0 - QUEtiapine (SEROQUEL) 300 MG tablet; Take 1 tablet (300 mg total) by mouth at bedtime.  Dispense: 90 tablet; Refill: 0 - sertraline (ZOLOFT) 100 MG tablet; TAKE 2 TABLETS(200 MG) BY MOUTH  DAILY  Dispense: 180 tablet; Refill: 0  - Melissa Ochoa was only given 7 tabs of Adderall at [facility] due to lack of stock. [facility] if [facility] are [facility] of stock then the script can be sent to [facility] or another pharmacy to be filled.   Follow Up Instructions: In 2-3 months or sooner if needed   I discussed the assessment and treatment plan with the patient. The patient was provided an opportunity to ask questions and all were answered. The patient agreed with the plan and demonstrated an understanding of the instructions.   The patient was advised to call back or seek an in-person evaluation if the symptoms worsen or if the condition fails to improve as anticipated.  I provided 26 minutes of non-face-to-face time during this encounter.   Charlcie Cradle, MD

## 2021-06-06 ENCOUNTER — Other Ambulatory Visit (HOSPITAL_COMMUNITY): Payer: Self-pay | Admitting: Psychiatry

## 2021-06-06 DIAGNOSIS — F900 Attention-deficit hyperactivity disorder, predominantly inattentive type: Secondary | ICD-10-CM

## 2021-06-06 MED ORDER — AMPHETAMINE-DEXTROAMPHETAMINE 30 MG PO TABS: 30.0000 mg | ORAL_TABLET | Freq: Two times a day (BID) | ORAL | 0 refills | Status: DC | PRN

## 2021-06-10 ENCOUNTER — Other Ambulatory Visit (HOSPITAL_COMMUNITY): Payer: Self-pay | Admitting: Psychiatry

## 2021-06-10 DIAGNOSIS — F411 Generalized anxiety disorder: Secondary | ICD-10-CM

## 2021-06-10 DIAGNOSIS — F3130 Bipolar disorder, current episode depressed, mild or moderate severity, unspecified: Secondary | ICD-10-CM

## 2021-06-18 DIAGNOSIS — Z96651 Presence of right artificial knee joint: Secondary | ICD-10-CM | POA: Diagnosis not present

## 2021-06-18 DIAGNOSIS — M25561 Pain in right knee: Secondary | ICD-10-CM | POA: Diagnosis not present

## 2021-07-02 ENCOUNTER — Telehealth (HOSPITAL_COMMUNITY): Payer: Self-pay | Admitting: *Deleted

## 2021-07-02 NOTE — Telephone Encounter (Signed)
Pt called requesting early fill of the Adderall 30 mg because she's going out of town and Rx not refillable until 07/06/21 per script. Pt asked that it be filled by tomorrow. Please review and advise. Thanks.

## 2021-07-03 ENCOUNTER — Other Ambulatory Visit (HOSPITAL_COMMUNITY): Payer: Self-pay | Admitting: Psychiatry

## 2021-07-03 DIAGNOSIS — F900 Attention-deficit hyperactivity disorder, predominantly inattentive type: Secondary | ICD-10-CM

## 2021-07-03 MED ORDER — AMPHETAMINE-DEXTROAMPHETAMINE 30 MG PO TABS: 30.0000 mg | ORAL_TABLET | Freq: Two times a day (BID) | ORAL | 0 refills | Status: DC

## 2021-08-20 ENCOUNTER — Other Ambulatory Visit (HOSPITAL_COMMUNITY): Payer: Self-pay | Admitting: Psychiatry

## 2021-08-20 DIAGNOSIS — F411 Generalized anxiety disorder: Secondary | ICD-10-CM

## 2021-08-20 DIAGNOSIS — F3130 Bipolar disorder, current episode depressed, mild or moderate severity, unspecified: Secondary | ICD-10-CM

## 2021-08-27 ENCOUNTER — Other Ambulatory Visit (HOSPITAL_COMMUNITY): Payer: Self-pay | Admitting: Psychiatry

## 2021-08-27 DIAGNOSIS — F411 Generalized anxiety disorder: Secondary | ICD-10-CM

## 2021-08-27 DIAGNOSIS — F3130 Bipolar disorder, current episode depressed, mild or moderate severity, unspecified: Secondary | ICD-10-CM

## 2021-08-28 ENCOUNTER — Other Ambulatory Visit: Payer: Self-pay

## 2021-08-28 ENCOUNTER — Telehealth (HOSPITAL_BASED_OUTPATIENT_CLINIC_OR_DEPARTMENT_OTHER): Payer: Medicare Other | Admitting: Psychiatry

## 2021-08-28 DIAGNOSIS — F900 Attention-deficit hyperactivity disorder, predominantly inattentive type: Secondary | ICD-10-CM | POA: Diagnosis not present

## 2021-08-28 DIAGNOSIS — F3130 Bipolar disorder, current episode depressed, mild or moderate severity, unspecified: Secondary | ICD-10-CM

## 2021-08-28 DIAGNOSIS — F411 Generalized anxiety disorder: Secondary | ICD-10-CM

## 2021-08-28 MED ORDER — AMPHETAMINE-DEXTROAMPHETAMINE 30 MG PO TABS: 30.0000 mg | ORAL_TABLET | Freq: Two times a day (BID) | ORAL | 0 refills | Status: DC | PRN

## 2021-08-28 MED ORDER — ALPRAZOLAM 1 MG PO TABS: 1.0000 mg | ORAL_TABLET | Freq: Four times a day (QID) | ORAL | 2 refills | Status: DC | PRN

## 2021-08-28 MED ORDER — SERTRALINE HCL 100 MG PO TABS: ORAL_TABLET | ORAL | 0 refills | Status: DC

## 2021-08-28 MED ORDER — AMPHETAMINE-DEXTROAMPHETAMINE 30 MG PO TABS: 30.0000 mg | ORAL_TABLET | Freq: Two times a day (BID) | ORAL | 0 refills | Status: DC

## 2021-08-28 MED ORDER — LAMOTRIGINE 100 MG PO TABS: ORAL_TABLET | ORAL | 0 refills | Status: DC

## 2021-08-28 MED ORDER — QUETIAPINE FUMARATE 300 MG PO TABS: 300.0000 mg | ORAL_TABLET | Freq: Every day | ORAL | 0 refills | Status: DC

## 2021-08-28 NOTE — Progress Notes (Signed)
Virtual Visit via Telephone Note  I connected with Melissa Ochoa on 08/28/21 at  1:30 PM EST by telephone and verified that I am speaking with the correct person using two identifiers. We attempted to connect by video multiple times but were unable to do so.   Location: Patient: at daughter's house Provider: office   I discussed the limitations, risks, security and privacy concerns of performing an evaluation and management service by telephone and the availability of in person appointments. I also discussed with the patient that there may be a patient responsible charge related to this service. The patient expressed understanding and agreed to proceed.   History of Present Illness: Melissa Ochoa shares she had a great Thanksgiving and is looking forward to Christmas. She is still babysitting her grandson several days a week. Melissa Ochoa is still suffering with knee pain since her knee replacement surgery. Her health and the constant pain are a big source of depression and anxiety. When stressed she gets frustrated by everything. She is also bothered by what is happening socially and politically in the country. Melissa Ochoa is worried about her daughter. Everything combined is overwhelming. At times it makes her feel "manic". Her mind races and she can't focus, her mood gets more irritable and will yell a lot. She will then "hide". It can last from a few hours to up to 1 day. Afterwards she feels tired and her mood gets down. Recently it seems like she is agitated every other day. She is able to get past it eventually. Her sleep is generally poor and she has had some nights where she couldn't sleep at all. Melissa Ochoa is doing well with Adderall. If she doesn't take it then she gets then she can't focus or accomplish things she needs to do. She denies SI/HI.    Observations/Objective:  General Appearance: unable to assess  Eye Contact:  unable to assess  Speech:  Clear and Coherent and Normal Rate  Volume:   Normal  Mood:  Anxious and Depressed  Affect:  Congruent  Thought Process:  Coherent and Descriptions of Associations: Circumstantial  Orientation:  Full (Time, Place, and Person)  Thought Content:  Rumination  Suicidal Thoughts:  No  Homicidal Thoughts:  No  Memory:  Immediate;   Good  Judgement:  Good  Insight:  Good  Psychomotor Activity: unable to assess  Concentration:  Concentration: Good  Recall:  Good  Fund of Knowledge:  Good  Language:  Good  Akathisia:  unable to assess  Handed:  unable to assess  AIMS (if indicated):     Assets:  Communication Skills Desire for Improvement Financial Resources/Insurance Housing Intimacy Social Support Talents/Skills Transportation Vocational/Educational  ADL's:  unable to assess  Cognition:  WNL  Sleep:        Assessment and Plan:  Depression screen [facility] 2/9 08/28/2021 03/13/2021 04/17/2014  Decreased Interest 2 0 0  Down, Depressed, Hopeless 2 1 0  PHQ - 2 Score 4 1 0  Altered sleeping 2 - -  Tired, decreased energy 1 - -  Change in appetite 0 - -  Feeling bad or failure about yourself  3 - -  Trouble concentrating 0 - -  Moving slowly or fidgety/restless 0 - -  Suicidal thoughts 0 - -  PHQ-9 Score 10 - -  Difficult doing work/chores Very difficult - -    Flowsheet Row Video Visit from 08/28/2021 in [facility] Video Visit from 03/13/2021 in [facility]  C-SSRS RISK CATEGORY No Risk No Risk      - I have encouraged Melissa Ochoa to consider therapy but she states she has so much going on and is overwhelmed so she is not ready to engage in therapy  1. GAD (generalized anxiety disorder) - ALPRAZolam (XANAX) 1 MG tablet; Take 1 tablet (1 mg total) by mouth 4 (four) times daily as needed for anxiety.  Dispense: 120 tablet; Refill: 2 - QUEtiapine (SEROQUEL) 300 MG tablet; Take 1 tablet (300 mg total) by mouth at bedtime.  Dispense: 90 tablet; Refill:  0 - sertraline (ZOLOFT) 100 MG tablet; TAKE 2 TABLETS(200 MG) BY MOUTH DAILY  Dispense: 180 tablet; Refill: 0  2. Attention deficit hyperactivity disorder (ADHD), predominantly inattentive type - amphetamine-dextroamphetamine (ADDERALL) 30 MG tablet; Take 1 tablet by mouth 2 (two) times daily.  Dispense: 60 tablet; Refill: 0 - amphetamine-dextroamphetamine (ADDERALL) 30 MG tablet; Take 1 tablet by mouth 2 (two) times daily as needed (attention deficit).  Dispense: 60 tablet; Refill: 0 - amphetamine-dextroamphetamine (ADDERALL) 30 MG tablet; Take 1 tablet by mouth 2 (two) times daily.  Dispense: 60 tablet; Refill: 0  3. Bipolar I disorder, most recent episode depressed (HCC) - lamoTRIgine (LAMICTAL) 100 MG tablet; TAKE 1 TABLET(100 MG) BY MOUTH TWICE DAILY  Dispense: 180 tablet; Refill: 0 - QUEtiapine (SEROQUEL) 300 MG tablet; Take 1 tablet (300 mg total) by mouth at bedtime.  Dispense: 90 tablet; Refill: 0 - sertraline (ZOLOFT) 100 MG tablet; TAKE 2 TABLETS(200 MG) BY MOUTH DAILY  Dispense: 180 tablet; Refill: 0   Follow Up Instructions: In 2-3 months or sooner if needed   I discussed the assessment and treatment plan with the patient. The patient was provided an opportunity to ask questions and all were answered. The patient agreed with the plan and demonstrated an understanding of the instructions.   The patient was advised to call back or seek an in-person evaluation if the symptoms worsen or if the condition fails to improve as anticipated.  I provided 25 minutes of non-face-to-face time during this encounter.   Charlcie Cradle, MD

## 2021-09-04 ENCOUNTER — Other Ambulatory Visit (HOSPITAL_COMMUNITY): Payer: Self-pay | Admitting: Psychiatry

## 2021-09-04 ENCOUNTER — Telehealth (HOSPITAL_COMMUNITY): Payer: Self-pay | Admitting: *Deleted

## 2021-09-04 DIAGNOSIS — F900 Attention-deficit hyperactivity disorder, predominantly inattentive type: Secondary | ICD-10-CM

## 2021-09-04 MED ORDER — AMPHETAMINE-DEXTROAMPHETAMINE 30 MG PO TABS: 30.0000 mg | ORAL_TABLET | Freq: Two times a day (BID) | ORAL | 0 refills | Status: DC

## 2021-09-04 NOTE — Telephone Encounter (Signed)
Pt called [facility] [address] As the [facility] at [address] Does not have any Adderall currently. This is confirmed by [facility].

## 2021-10-01 DIAGNOSIS — I129 Hypertensive chronic kidney disease with stage 1 through stage 4 chronic kidney disease, or unspecified chronic kidney disease: Secondary | ICD-10-CM | POA: Diagnosis not present

## 2021-10-01 DIAGNOSIS — N182 Chronic kidney disease, stage 2 (mild): Secondary | ICD-10-CM | POA: Diagnosis not present

## 2021-10-01 DIAGNOSIS — D631 Anemia in chronic kidney disease: Secondary | ICD-10-CM | POA: Diagnosis not present

## 2021-10-01 DIAGNOSIS — N189 Chronic kidney disease, unspecified: Secondary | ICD-10-CM | POA: Diagnosis not present

## 2021-10-01 DIAGNOSIS — D179 Benign lipomatous neoplasm, unspecified: Secondary | ICD-10-CM | POA: Diagnosis not present

## 2021-10-03 ENCOUNTER — Telehealth (HOSPITAL_COMMUNITY): Payer: Self-pay

## 2021-10-03 NOTE — Telephone Encounter (Signed)
Patient called requesting a refill on her Adderall 30mg . [facility] [address] [facility] [place] to make sure they had it due to it being sent there last time. Pharmacist stated that they do. Thank you

## 2021-10-09 ENCOUNTER — Other Ambulatory Visit (HOSPITAL_COMMUNITY): Payer: Self-pay | Admitting: Psychiatry

## 2021-10-09 DIAGNOSIS — F900 Attention-deficit hyperactivity disorder, predominantly inattentive type: Secondary | ICD-10-CM

## 2021-10-09 MED ORDER — AMPHETAMINE-DEXTROAMPHETAMINE 30 MG PO TABS: 30.0000 mg | ORAL_TABLET | Freq: Two times a day (BID) | ORAL | 0 refills | Status: DC

## 2021-10-10 NOTE — Telephone Encounter (Signed)
Notified patient - Pt didn't pick up so writer LVM

## 2021-10-14 ENCOUNTER — Other Ambulatory Visit: Payer: Self-pay | Admitting: Nephrology

## 2021-10-14 DIAGNOSIS — N181 Chronic kidney disease, stage 1: Secondary | ICD-10-CM

## 2021-10-14 DIAGNOSIS — I159 Secondary hypertension, unspecified: Secondary | ICD-10-CM

## 2021-10-17 DIAGNOSIS — R109 Unspecified abdominal pain: Secondary | ICD-10-CM | POA: Diagnosis not present

## 2021-10-29 ENCOUNTER — Telehealth (HOSPITAL_COMMUNITY): Payer: Self-pay | Admitting: *Deleted

## 2021-10-29 NOTE — Telephone Encounter (Signed)
Pt called requesting early refill of the Adderall 30 mg as she will be out of town for a week starting tomorrow. [facility]. [facility] [place] as she knows they have medication in stock currently and worries they may not by the time she comes back. Please review.

## 2021-10-30 NOTE — Telephone Encounter (Signed)
Writer made pt aware

## 2021-11-14 ENCOUNTER — Other Ambulatory Visit (HOSPITAL_COMMUNITY): Payer: Self-pay | Admitting: Nephrology

## 2021-11-14 DIAGNOSIS — D179 Benign lipomatous neoplasm, unspecified: Secondary | ICD-10-CM

## 2021-11-14 DIAGNOSIS — Q851 Tuberous sclerosis: Secondary | ICD-10-CM

## 2021-11-20 ENCOUNTER — Other Ambulatory Visit: Payer: Self-pay

## 2021-11-20 ENCOUNTER — Telehealth (HOSPITAL_BASED_OUTPATIENT_CLINIC_OR_DEPARTMENT_OTHER): Payer: Medicare Other | Admitting: Psychiatry

## 2021-11-20 DIAGNOSIS — F900 Attention-deficit hyperactivity disorder, predominantly inattentive type: Secondary | ICD-10-CM | POA: Diagnosis not present

## 2021-11-20 DIAGNOSIS — F3130 Bipolar disorder, current episode depressed, mild or moderate severity, unspecified: Secondary | ICD-10-CM | POA: Diagnosis not present

## 2021-11-20 DIAGNOSIS — F411 Generalized anxiety disorder: Secondary | ICD-10-CM

## 2021-11-20 MED ORDER — AMPHETAMINE-DEXTROAMPHETAMINE 30 MG PO TABS: 30.0000 mg | ORAL_TABLET | Freq: Two times a day (BID) | ORAL | 0 refills | Status: DC | PRN

## 2021-11-20 MED ORDER — SERTRALINE HCL 100 MG PO TABS: ORAL_TABLET | ORAL | 0 refills | Status: DC

## 2021-11-20 MED ORDER — QUETIAPINE FUMARATE 300 MG PO TABS: 300.0000 mg | ORAL_TABLET | Freq: Every day | ORAL | 0 refills | Status: DC

## 2021-11-20 MED ORDER — LAMOTRIGINE 100 MG PO TABS: ORAL_TABLET | ORAL | 0 refills | Status: DC

## 2021-11-20 MED ORDER — AMPHETAMINE-DEXTROAMPHETAMINE 30 MG PO TABS: 30.0000 mg | ORAL_TABLET | Freq: Two times a day (BID) | ORAL | 0 refills | Status: DC

## 2021-11-20 MED ORDER — ALPRAZOLAM 1 MG PO TABS: 1.0000 mg | ORAL_TABLET | Freq: Four times a day (QID) | ORAL | 2 refills | Status: DC | PRN

## 2021-11-20 NOTE — Progress Notes (Signed)
Virtual Visit via Video Note  I connected with Melissa Ochoa on 11/20/21 at  3:00 PM EST by a video enabled telemedicine application and verified that I am speaking with the correct person using two identifiers.  Location: Patient: home Provider: office   I discussed the limitations of evaluation and management by telemedicine and the availability of in person appointments. The patient expressed understanding and agreed to proceed.  History of Present Illness: "I am under a lot of stress and pressure right now". She is not watching her grand kids anymore because her daughter is now working weekend nights. As a result she doesn't see them as much and it hurts to be away from there. Melissa Ochoa has a family member who is alcoholic and it has gotten really bad. Melissa Ochoa thinks the family member may have attempted suicide recent but she is not sure. She is feeling confused and depressed. Melissa Ochoa is having anxiety and feels like she failed in helping them. It makes her feel worthless. Melissa Ochoa doesn't know what to do and is scared.  "If she jumps then I am jumping with her. I can't lose a child. I am telling you that". All of this has made her feel hopeless and caused self blame. Melissa Ochoa denies SI/H- "I wouldn't do that to her or my family but if anything happens to her then I don't know". Pt denies recent manic and hypomanic symptoms including periods of decreased need for sleep, increased energy, mood lability, impulsivity, FOI, and excessive spending.    Observations/Objective: Psychiatric Specialty Exam: ROS  There were no vitals taken for this visit.There is no height or weight on file to calculate BMI.  General Appearance: Fairly Groomed and Neat  Eye Contact:  Fair  Speech:  Clear and Coherent and Slow  Volume:  Normal  Mood:  Anxious and Depressed  Affect:  Congruent  Thought Process:  Coherent and Descriptions of Associations: Intact  Orientation:  Full (Time, Place, and Person)  Thought  Content:  Rumination  Suicidal Thoughts:  No  Homicidal Thoughts:  No  Memory:  Immediate;   Good  Judgement:  Good  Insight:  Good  Psychomotor Activity:  Normal  Concentration:  Concentration: Good  Recall:  Good  Fund of Knowledge:  Good  Language:  Good  Akathisia:  No  Handed:  Right  AIMS (if indicated):     Assets:  Communication Skills Desire for Improvement Financial Resources/Insurance [place] Talents/Skills Transportation Vocational/Educational  ADL's:  Intact  Cognition:  WNL  Sleep:        Assessment and Plan:  - referred to [employer] for family support  1. GAD (generalized anxiety disorder) - sertraline (ZOLOFT) 100 MG tablet; TAKE 2 TABLETS(200 MG) BY MOUTH DAILY  Dispense: 180 tablet; Refill: 0 - QUEtiapine (SEROQUEL) 300 MG tablet; Take 1 tablet (300 mg total) by mouth at bedtime.  Dispense: 90 tablet; Refill: 0 - ALPRAZolam (XANAX) 1 MG tablet; Take 1 tablet (1 mg total) by mouth 4 (four) times daily as needed for anxiety.  Dispense: 120 tablet; Refill: 2  2. Attention deficit hyperactivity disorder (ADHD), predominantly inattentive type - amphetamine-dextroamphetamine (ADDERALL) 30 MG tablet; Take 1 tablet by mouth 2 (two) times daily as needed (attention deficit).  Dispense: 60 tablet; Refill: 0 - amphetamine-dextroamphetamine (ADDERALL) 30 MG tablet; Take 1 tablet by mouth 2 (two) times daily.  Dispense: 60 tablet; Refill: 0  3. Bipolar I disorder, most recent episode depressed (HCC) - sertraline (ZOLOFT) 100 MG tablet;  TAKE 2 TABLETS(200 MG) BY MOUTH DAILY  Dispense: 180 tablet; Refill: 0 - QUEtiapine (SEROQUEL) 300 MG tablet; Take 1 tablet (300 mg total) by mouth at bedtime.  Dispense: 90 tablet; Refill: 0 - lamoTRIgine (LAMICTAL) 100 MG tablet; TAKE 1 TABLET(100 MG) BY MOUTH TWICE DAILY  Dispense: 180 tablet; Refill: 0   Follow Up Instructions: In 2-3 months or sooner if needed   I discussed the assessment and  treatment plan with the patient. The patient was provided an opportunity to ask questions and all were answered. The patient agreed with the plan and demonstrated an understanding of the instructions.   The patient was advised to call back or seek an in-person evaluation if the symptoms worsen or if the condition fails to improve as anticipated.  I provided 22 minutes of non-face-to-face time during this encounter.   Charlcie Cradle, MD

## 2021-12-18 ENCOUNTER — Telehealth (HOSPITAL_COMMUNITY): Payer: Self-pay | Admitting: *Deleted

## 2021-12-18 NOTE — Telephone Encounter (Signed)
Pt called stating that she's going back up to [place], [place], and would like the Xanax and Adderral scripts sent [facility] pharmacy [facility] [address] [facility] [place] Looks like she should still have at least one refill current local pharmacy however. Writer LVM for pt advising that and it confirm that she's still in town. Pt next scheduled appointment 01/01/22. Please review ?

## 2021-12-18 NOTE — Telephone Encounter (Signed)
That what I told her. Pam is speaking to pharmacy now.

## 2021-12-18 NOTE — Telephone Encounter (Signed)
I would seriously doubt they would.

## 2021-12-19 ENCOUNTER — Telehealth (HOSPITAL_COMMUNITY): Payer: Self-pay

## 2021-12-19 NOTE — Telephone Encounter (Signed)
Patient called regarding her Alprazolam (Xanax) '1mg'$  and her Adderall '30mg'$ . She stated that she's leaving for [place] tomorrow morning and needed refills on her medication. 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 They stated that pt's insurance won't pay for her Adderall until the 28th; however, pt can pay for it out of pocket and pay $34 for #60 with a discount card (normally $100). Pharmacy has pt's medications filled and ready for her to pick up ?NOTIFIED PATIENT ?

## 2022-01-01 ENCOUNTER — Telehealth (HOSPITAL_BASED_OUTPATIENT_CLINIC_OR_DEPARTMENT_OTHER): Payer: Medicare Other | Admitting: Psychiatry

## 2022-01-01 DIAGNOSIS — F900 Attention-deficit hyperactivity disorder, predominantly inattentive type: Secondary | ICD-10-CM | POA: Diagnosis not present

## 2022-01-01 DIAGNOSIS — F411 Generalized anxiety disorder: Secondary | ICD-10-CM

## 2022-01-01 DIAGNOSIS — F3130 Bipolar disorder, current episode depressed, mild or moderate severity, unspecified: Secondary | ICD-10-CM

## 2022-01-01 DIAGNOSIS — Z79899 Other long term (current) drug therapy: Secondary | ICD-10-CM

## 2022-01-01 MED ORDER — QUETIAPINE FUMARATE 300 MG PO TABS: 300.0000 mg | ORAL_TABLET | Freq: Every day | ORAL | 0 refills | Status: DC

## 2022-01-01 MED ORDER — ALPRAZOLAM 1 MG PO TABS: 1.0000 mg | ORAL_TABLET | Freq: Four times a day (QID) | ORAL | 1 refills | Status: DC | PRN

## 2022-01-01 MED ORDER — AMPHETAMINE-DEXTROAMPHETAMINE 30 MG PO TABS: 30.0000 mg | ORAL_TABLET | Freq: Two times a day (BID) | ORAL | 0 refills | Status: DC

## 2022-01-01 MED ORDER — SERTRALINE HCL 100 MG PO TABS: ORAL_TABLET | ORAL | 0 refills | Status: DC

## 2022-01-01 MED ORDER — LAMOTRIGINE 100 MG PO TABS: ORAL_TABLET | ORAL | 0 refills | Status: DC

## 2022-01-01 MED ORDER — AMPHETAMINE-DEXTROAMPHETAMINE 30 MG PO TABS: 30.0000 mg | ORAL_TABLET | Freq: Two times a day (BID) | ORAL | 0 refills | Status: DC | PRN

## 2022-01-01 NOTE — Progress Notes (Signed)
Virtual Visit via Video Note ? ?I connected with Melissa Ochoa on 01/01/22 at 10:45 AM EDT by a video enabled telemedicine application and verified that I am speaking with the correct person using two identifiers. ? ?Location: ?Patient: home ?Provider: office ?  ?I discussed the limitations of evaluation and management by telemedicine and the availability of in person appointments. The patient expressed understanding and agreed to proceed. ? ?History of Present Illness: ?Melissa Ochoa shares she is not feeling well. She is trying to be positive about life but is very depressed. Melissa Ochoa is no longer babysitting her grandkids and it makes her feel like she has no purpose anymore. She is very depressed and people around her have noticed it a little. Melissa Ochoa is crying more often. She is happy when she is busy and socializing but otherwise is not. Her sleep is fair. She wakes up to go to the bathroom twice/night and it 1-2x she was not able to fall back asleep. Her mind is always racing. She is trying to make an effort to get changed and put on makeup everyday. Her energy is low most days due to pain. Her appetite is so/so. Her concentration is not as good as before. Melissa Ochoa is endorsing low self esteem and worthlessness. She denies passive thoughts of death. She has on/off SI when she is driving but denies ever trying. This is something that has been going on for years and states that the frequency and severity is unchanged.  She wants to live for her family and her grandkids. She denies HI. She denies manic and hypomanic like symptoms of several nights of decreased need for sleep, increased energy or impulsivity. Melissa Ochoa is overwhelmed and has on/off anxiety with racing thoughts and inability to relax. Xanax remains effective but she is unable to determine how long the effect lasts. She is taking it 4 times/day She sometimes wishes she could run away but denies any plan or intent to do so. She has missed numerous  doctors appointments. Most days she stays in bed late and then does some things around the house. She reports poor motivation to do "extra" things like taxes and spring cleaning. Adderall helps to keep her focused and to complete tasks when she takes it.  ?  ?Observations/Objective: ?Psychiatric Specialty Exam: ?ROS  ?There were no vitals taken for this visit.There is no height or weight on file to calculate BMI.  ?General Appearance: Neat and Well Groomed  ?Eye Contact:  Good  ?Speech:  Clear and Coherent and Normal Rate  ?Volume:  Normal  ?Mood:  Depressed  ?Affect:  Congruent  ?Thought Process:  Goal Directed, Linear, and Descriptions of Associations: Intact  ?Orientation:  Full (Time, Place, and Person)  ?Thought Content:  Logical  ?Suicidal Thoughts:  No  ?Homicidal Thoughts:  No  ?Memory:  Immediate;   Good  ?Judgement:  Good  ?Insight:  Good  ?Psychomotor Activity:  Normal  ?Concentration:  Concentration: Good  ?Recall:  Good  ?Fund of Knowledge:  Good  ?Language:  Good  ?Akathisia:  No  ?Handed:  Right  ?AIMS (if indicated):     ?Assets:  Communication Skills ?Desire for Improvement ?Financial Resources/Insurance ?Housing ?Intimacy ?Resilience ?Social Support ?Talents/Skills ?Transportation ?Vocational/Educational  ?ADL's:  Intact  ?Cognition:  WNL  ?Sleep:     ? ? ? ?Assessment and Plan: ? ? ?  01/01/2022  ? 11:07 AM 08/28/2021  ?  1:48 PM 03/13/2021  ?  2:37 PM 04/17/2014  ? 11:41 AM  ?Depression  screen PHQ 2/9  ?Decreased Interest 1 2 0 0  ?Down, Depressed, Hopeless '3 2 1 '$ 0  ?PHQ - 2 Score '4 4 1 '$ 0  ?Altered sleeping 1 2    ?Tired, decreased energy 2 1    ?Change in appetite 2 0    ?Feeling bad or failure about yourself  3 3    ?Trouble concentrating 2 0    ?Moving slowly or fidgety/restless 0 0    ?Suicidal thoughts 0 0    ?PHQ-9 Score 14 10    ?Difficult doing work/chores Extremely dIfficult Very difficult    ? ? ?Flowsheet Row Video Visit from 01/01/2022 in [facility]  Video Visit from 08/28/2021 in [facility] Video Visit from 03/13/2021 in [facility]  ?C-SSRS RISK CATEGORY Moderate Risk No Risk No Risk  ? ?  ? ? ?Status of current problems: worsening depression and anxiety ? ?Meds:  ?1. GAD (generalized anxiety disorder) ? ?2. Attention deficit hyperactivity disorder (ADHD), predominantly inattentive type ? ?3. Bipolar I disorder, most recent episode depressed ([facility]) ? ?Majesty does not want her meds changed at this time.  ? ? ?Labs: CBC, CMP, HbA1c, Lipid panel, TSH, Prolactin level ?EKG will be done at PCP office ? ? ? ?Therapy: brief supportive therapy provided. Discussed psychosocial stressors in detail.   ? ? ? ? ?Consultations:  ?Encouraged to follow up with PCP as needed ? ?Collaboration of Care: Other none ? ?Patient/Guardian was advised Release of Information must be obtained prior to any record release in order to collaborate their care with an outside provider. Patient/Guardian was advised if they have not already done so to contact the registration department to sign all necessary forms in order for [place] to release information regarding their care.  ? ?Consent: Patient/Guardian gives verbal consent for treatment and assignment of benefits for services provided during this visit. Patient/Guardian expressed understanding and agreed to proceed.  ? ? ?Pt's acute risk factors for suicide are fleeting SI without intent, worsening depression symptoms. Pt's chronic risk factors are physical health, chronic SI. Pt's protective factors are wanting to live for her family, no hx of previous suicide attempts. Pt reports fleeting SI and is at an acute low risk for suicide. Patient told to call clinic if any problems occur. Patient advised to go to ER if they should develop SI/HI, side effects, or if symptoms worsen. Pt has crisis numbers to call if needed. Pt acknowledged and agreed with plan and  verbalized understanding. ? ?Follow Up Instructions: ?Follow up in 1-2 months or sooner if needed ?  ? ?I discussed the assessment and treatment plan with the patient. The patient was provided an opportunity to ask questions and all were answered. The patient agreed with the plan and demonstrated an understanding of the instructions. ?  ?The patient was advised to call back or seek an in-person evaluation if the symptoms worsen or if the condition fails to improve as anticipated. ? ?I provided 19 minutes of non-face-to-face time during this encounter. ? ? ?Charlcie Cradle, MD ? ?

## 2022-01-04 ENCOUNTER — Other Ambulatory Visit (HOSPITAL_COMMUNITY): Payer: Self-pay | Admitting: Psychiatry

## 2022-01-04 DIAGNOSIS — F3130 Bipolar disorder, current episode depressed, mild or moderate severity, unspecified: Secondary | ICD-10-CM

## 2022-01-04 DIAGNOSIS — F411 Generalized anxiety disorder: Secondary | ICD-10-CM

## 2022-01-13 DIAGNOSIS — N189 Chronic kidney disease, unspecified: Secondary | ICD-10-CM | POA: Diagnosis not present

## 2022-01-13 DIAGNOSIS — N182 Chronic kidney disease, stage 2 (mild): Secondary | ICD-10-CM | POA: Diagnosis not present

## 2022-01-30 ENCOUNTER — Inpatient Hospital Stay (HOSPITAL_COMMUNITY): Admission: RE | Admit: 2022-01-30 | Payer: Medicare Other | Source: Ambulatory Visit

## 2022-01-30 ENCOUNTER — Ambulatory Visit (HOSPITAL_COMMUNITY)
Admission: RE | Admit: 2022-01-30 | Discharge: 2022-01-30 | Disposition: A | Discharge status: Home / Self Care | Payer: Medicare Other | Source: Ambulatory Visit | Attending: Nephrology | Admitting: Nephrology

## 2022-01-30 DIAGNOSIS — N181 Chronic kidney disease, stage 1: Secondary | ICD-10-CM | POA: Insufficient documentation

## 2022-01-30 DIAGNOSIS — K769 Liver disease, unspecified: Secondary | ICD-10-CM | POA: Diagnosis not present

## 2022-01-30 DIAGNOSIS — I159 Secondary hypertension, unspecified: Secondary | ICD-10-CM | POA: Diagnosis not present

## 2022-01-30 DIAGNOSIS — Q851 Tuberous sclerosis: Secondary | ICD-10-CM | POA: Diagnosis not present

## 2022-01-30 DIAGNOSIS — N189 Chronic kidney disease, unspecified: Secondary | ICD-10-CM | POA: Diagnosis not present

## 2022-01-30 MED ORDER — GADOXETATE DISODIUM 0.25 MMOL/ML IV SOLN
9.0000 mL | Freq: Once | INTRAVENOUS | Status: AC | PRN
  Administered 2022-01-30: 9 mL via INTRAVENOUS

## 2022-02-04 DIAGNOSIS — D3002 Benign neoplasm of left kidney: Secondary | ICD-10-CM | POA: Diagnosis not present

## 2022-02-05 ENCOUNTER — Telehealth (HOSPITAL_BASED_OUTPATIENT_CLINIC_OR_DEPARTMENT_OTHER): Payer: Medicare Other | Admitting: Psychiatry

## 2022-02-05 DIAGNOSIS — F411 Generalized anxiety disorder: Secondary | ICD-10-CM | POA: Diagnosis not present

## 2022-02-05 DIAGNOSIS — F3130 Bipolar disorder, current episode depressed, mild or moderate severity, unspecified: Secondary | ICD-10-CM | POA: Diagnosis not present

## 2022-02-05 DIAGNOSIS — F900 Attention-deficit hyperactivity disorder, predominantly inattentive type: Secondary | ICD-10-CM

## 2022-02-05 MED ORDER — LAMOTRIGINE 100 MG PO TABS: ORAL_TABLET | ORAL | 0 refills | Status: DC

## 2022-02-05 MED ORDER — AMPHETAMINE-DEXTROAMPHETAMINE 30 MG PO TABS: 30.0000 mg | ORAL_TABLET | Freq: Two times a day (BID) | ORAL | 0 refills | Status: DC

## 2022-02-05 MED ORDER — QUETIAPINE FUMARATE 300 MG PO TABS: 300.0000 mg | ORAL_TABLET | Freq: Every day | ORAL | 0 refills | Status: DC

## 2022-02-05 MED ORDER — ALPRAZOLAM 1 MG PO TABS: 1.0000 mg | ORAL_TABLET | Freq: Four times a day (QID) | ORAL | 2 refills | Status: DC | PRN

## 2022-02-05 MED ORDER — AMPHETAMINE-DEXTROAMPHETAMINE 30 MG PO TABS: 30.0000 mg | ORAL_TABLET | Freq: Two times a day (BID) | ORAL | 0 refills | Status: DC | PRN

## 2022-02-05 MED ORDER — SERTRALINE HCL 100 MG PO TABS: ORAL_TABLET | ORAL | 0 refills | Status: DC

## 2022-02-05 NOTE — Progress Notes (Signed)
Virtual Visit via Video Note ? ?I connected with Melissa Ochoa on 02/05/22 at  3:15 PM EDT by a video enabled telemedicine application and verified that I am speaking with the correct person using two identifiers. ? ?Location: ?Patient: at her daughter's house ?Provider: office ?  ?I discussed the limitations of evaluation and management by telemedicine and the availability of in person appointments. The patient expressed understanding and agreed to proceed. ? ?History of Present Illness: ?Melissa Ochoa shares she is doing well and states things have been really good lately. She is feeling less stressed. Some of it might have to do with the summer weather. Melissa Ochoa has been spending more time with her grand kids. She likes going outside with them. Her youngest daughter is doing much better. Melissa Ochoa feels depressed on/off thru the week. The frequency is down to 1x/week mostly at night. Her sleep is fair but her energy is low. Her appetite is decreased. Melissa Ochoa continues to experience negative self thoughts. She denies SI/HI and passive thoughts of death. Pt denies recent manic and hypomanic symptoms including periods of decreased need for sleep, increased energy, mood lability, impulsivity, FOI, and excessive spending. Her concentration is good with Adderall. Melissa Ochoa always has anxiety and doesn't expect that to change. She worries about everything including her family members and personal health.. She denies any recent panic attacks.  ? ?  ?Observations/Objective: ?Psychiatric Specialty Exam: ?ROS  ?There were no vitals taken for this visit.There is no height or weight on file to calculate BMI.  ?General Appearance: Fairly Groomed and Neat  ?Eye Contact:  Good  ?Speech:  Clear and Coherent and Normal Rate  ?Volume:  Normal  ?Mood:  Anxious and Depressed  ?Affect:  Full Range  ?Thought Process:  Coherent and Descriptions of Associations: Circumstantial  ?Orientation:  Full (Time, Place, and Person)  ?Thought Content:   Rumination  ?Suicidal Thoughts:  No  ?Homicidal Thoughts:  No  ?Memory:  Immediate;   Fair  ?Judgement:  Good  ?Insight:  Good  ?Psychomotor Activity:  Normal  ?Concentration:  Concentration: Good  ?Recall:  Good  ?Fund of Knowledge:  Good  ?Language:  Good  ?Akathisia:  No  ?Handed:  Right  ?AIMS (if indicated):     ?Assets:  Communication Skills ?Desire for Improvement ?Financial Resources/Insurance ?Housing ?Intimacy ?Resilience ?Social Support ?Talents/Skills ?Transportation ?Vocational/Educational  ?ADL's:  Intact  ?Cognition:  WNL  ?Sleep:     ? ? ? ?Assessment and Plan: ? ? ?  02/05/2022  ?  3:29 PM 01/01/2022  ? 11:07 AM 08/28/2021  ?  1:48 PM 03/13/2021  ?  2:37 PM 04/17/2014  ? 11:41 AM  ?Depression screen PHQ 2/9  ?Decreased Interest 0 1 2 0 0  ?Down, Depressed, Hopeless '1 3 2 1 '$ 0  ?PHQ - 2 Score '1 4 4 1 '$ 0  ?Altered sleeping 0 1 2    ?Tired, decreased energy '2 2 1    '$ ?Change in appetite 2 2 0    ?Feeling bad or failure about yourself  '2 3 3    '$ ?Trouble concentrating 0 2 0    ?Moving slowly or fidgety/restless 0 0 0    ?Suicidal thoughts 0 0 0    ?PHQ-9 Score '7 14 10    '$ ?Difficult doing work/chores Somewhat difficult Extremely dIfficult Very difficult    ? ? ?Flowsheet Row Video Visit from 02/05/2022 in [facility] Video Visit from 01/01/2022 in [facility] Video Visit from 08/28/2021  in [facility]  ?C-SSRS RISK CATEGORY Error: Q3, 4, or 5 should not be populated when Q2 is No Moderate Risk No Risk  ? ?  ? ? ? ? ? ?Status of current problems: improved depression symptoms ? ?Meds:  ?1. GAD (generalized anxiety disorder) ?- ALPRAZolam (XANAX) 1 MG tablet; Take 1 tablet (1 mg total) by mouth 4 (four) times daily as needed for anxiety.  Dispense: 120 tablet; Refill: 2 ?- QUEtiapine (SEROQUEL) 300 MG tablet; Take 1 tablet (300 mg total) by mouth at bedtime.  Dispense: 90 tablet; Refill: 0 ?- sertraline  (ZOLOFT) 100 MG tablet; TAKE 2 TABLETS(200 MG) BY MOUTH DAILY  Dispense: 180 tablet; Refill: 0 ? ?2. Attention deficit hyperactivity disorder (ADHD), predominantly inattentive type ?- amphetamine-dextroamphetamine (ADDERALL) 30 MG tablet; Take 1 tablet by mouth 2 (two) times daily.  Dispense: 60 tablet; Refill: 0 ?- amphetamine-dextroamphetamine (ADDERALL) 30 MG tablet; Take 1 tablet by mouth 2 (two) times daily as needed (attention deficit).  Dispense: 60 tablet; Refill: 0 ?- amphetamine-dextroamphetamine (ADDERALL) 30 MG tablet; Take 1 tablet by mouth 2 (two) times daily.  Dispense: 60 tablet; Refill: 0 ? ?3. Bipolar I disorder, most recent episode depressed ([facility]) ?- lamoTRIgine (LAMICTAL) 100 MG tablet; TAKE 1 TABLET(100 MG) BY MOUTH TWICE DAILY  Dispense: 180 tablet; Refill: 0 ?- QUEtiapine (SEROQUEL) 300 MG tablet; Take 1 tablet (300 mg total) by mouth at bedtime.  Dispense: 90 tablet; Refill: 0 ?- sertraline (ZOLOFT) 100 MG tablet; TAKE 2 TABLETS(200 MG) BY MOUTH DAILY  Dispense: 180 tablet; Refill: 0 ? ? ? ? ?Labs: none today  ? ? ?Therapy: brief supportive therapy provided. Discussed psychosocial stressors in detail.   ? ? ?Collaboration of Care: Other none today ? ?Patient/Guardian was advised Release of Information must be obtained prior to any record release in order to collaborate their care with an outside provider. Patient/Guardian was advised if they have not already done so to contact the registration department to sign all necessary forms in order for [place] to release information regarding their care.  ? ?Consent: Patient/Guardian gives verbal consent for treatment and assignment of benefits for services provided during this visit. Patient/Guardian expressed understanding and agreed to proceed.  ? ? ? ?Follow Up Instructions: ?Follow up in 2-3 months or sooner if needed ?  ? ?I discussed the assessment and treatment plan with the patient. The patient was provided an opportunity to ask questions and all  were answered. The patient agreed with the plan and demonstrated an understanding of the instructions. ?  ?The patient was advised to call back or seek an in-person evaluation if the symptoms worsen or if the condition fails to improve as anticipated. ? ?I provided 17 minutes of non-face-to-face time during this encounter. ? ? ?Charlcie Cradle, MD ? ?

## 2022-02-16 ENCOUNTER — Other Ambulatory Visit: Payer: Self-pay | Admitting: Urology

## 2022-02-16 DIAGNOSIS — N2889 Other specified disorders of kidney and ureter: Secondary | ICD-10-CM

## 2022-02-24 ENCOUNTER — Encounter: Payer: Self-pay | Admitting: *Deleted

## 2022-02-24 ENCOUNTER — Ambulatory Visit
Admission: RE | Admit: 2022-02-24 | Discharge: 2022-02-24 | Disposition: A | Discharge status: Home / Self Care | Payer: Medicare Other | Source: Ambulatory Visit | Attending: Urology | Admitting: Urology

## 2022-02-24 DIAGNOSIS — N2889 Other specified disorders of kidney and ureter: Secondary | ICD-10-CM | POA: Diagnosis not present

## 2022-02-24 HISTORY — PX: IR RADIOLOGIST EVAL & MGMT: IMG5224

## 2022-02-24 NOTE — Consult Note (Signed)
Chief Complaint: Patient was seen in consultation today for left renal angiomyolipomas  at the request of Pace,Maryellen D  Referring Physician(s): Pace,Maryellen D  History of Present Illness: Melissa Ochoa is a 56 y.o. female with tuberous sclerosis and history of bilateral renal angiomyolipomas.  Patient has had prior renal artery embolizations due to spontaneous renal hemorrhages and had a right nephrectomy in March 2011.  It appears that the last renal artery angiography and embolization was performed on 11/06/2009.  Patient recently had a follow-up MRI on 01/30/2022 demonstrating marked enlargement of the left renal lesions.  Patient was referred to interventional radiology to discuss embolization options.  Patient is very concerned about having another spontaneous renal hemorrhage and she is worried about traveling due to the left renal lesions.  Patient is very opposed to a left nephrectomy at this time because she wants to avoid dialysis or renal transplantation if possible.  Patient complains of occasional shortness of breath and chest pain.  She has noted a small umbilical hernia and says that she has noted some fluid within the umbilicus on occasion.  She has mild left flank pain.  She complains of chronic right knee pain despite previous replacement.  Patient is followed by Dr. [name] for her kidney function.  Patient's last creatinine in April 2023 was 1.02.  Past Medical History:  Diagnosis Date   ADHD (attention deficit hyperactivity disorder)    Anxiety    Bipolar disorder (HCC)    Cervical disc disease    Colon polyps    adenomatous   Depression    Hypertension    Iron deficiency    Kidney disease    renal angiomyolipomas   Liver cyst    Melanosis    Pneumonia    Tuberous sclerosis ([place])     Past Surgical History:  Procedure Laterality Date   COLONOSCOPY W/ ENDOSCOPIC [place]     IR RADIOLOGIST EVAL & MGMT  02/24/2022   KNEE ARTHROSCOPY WITH  DRILLING/MICROFRACTURE Right 09/27/2018   Procedure: Right knee arthroscopy with partial lateral menisectomy;  Surgeon: Latanya Maudlin, MD;  Location: [facility];  Service: Orthopedics;  Laterality: Right;   NEPHRECTOMY Right    RENAL ARTERY EMBOLIZATION     2 procdures in the left , 4 in the right     TOTAL KNEE ARTHROPLASTY Right 03/27/2019   Procedure: TOTAL KNEE ARTHROPLASTY;  Surgeon: Gaynelle Arabian, MD;  Location: [facility];  Service: Orthopedics;  Laterality: Right;  59mn    Allergies: Patient has no known allergies.  Medications: Prior to Admission medications   Medication Sig Start Date End Date Taking? Authorizing Provider  ALPRAZolam (Duanne Moron 1 MG tablet Take 1 tablet (1 mg total) by mouth 4 (four) times daily as needed for anxiety. 02/05/22   ACharlcie Cradle MD  amphetamine-dextroamphetamine (ADDERALL) 30 MG tablet Take 1 tablet by mouth 2 (two) times daily. 02/05/22 02/05/23  ACharlcie Cradle MD  amphetamine-dextroamphetamine (ADDERALL) 30 MG tablet Take 1 tablet by mouth 2 (two) times daily as needed (attention deficit). 02/05/22 02/05/23  ACharlcie Cradle MD  amphetamine-dextroamphetamine (ADDERALL) 30 MG tablet Take 1 tablet by mouth 2 (two) times daily. 02/05/22 02/05/23  ACharlcie Cradle MD  labetalol (NORMODYNE) 200 MG tablet Take 400 mg by mouth 3 (three) times daily.     [provider]  lamoTRIgine (LAMICTAL) 100 MG tablet TAKE 1 TABLET(100 MG) BY MOUTH TWICE DAILY 02/05/22   ACharlcie Cradle MD  losartan (COZAAR) 50 MG tablet Take 50 mg by mouth daily.  [provider]  QUEtiapine (SEROQUEL) 300 MG tablet Take 1 tablet (300 mg total) by mouth at bedtime. 02/05/22   Charlcie Cradle, MD  sertraline (ZOLOFT) 100 MG tablet TAKE 2 TABLETS(200 MG) BY MOUTH DAILY 02/05/22   Charlcie Cradle, MD     Family History  Problem Relation Age of Onset   Anxiety disorder Mother    ADD / ADHD Mother    Depression Mother    Lung cancer Father        smoker   Bipolar disorder  Daughter    Bipolar disorder Other    Diabetes Other        a lot of relatives   Suicidality Neg Hx     Social History   Socioeconomic History   Marital status: Married    Spouse name: Not on file   Number of children: Not on file   Years of education: Not on file   Highest education level: Not on file  Occupational History   Not on file  Tobacco Use   Smoking status: Never   Smokeless tobacco: Never  Vaping Use   Vaping Use: Never used  Substance and Sexual Activity   Alcohol use: No    Alcohol/week: 0.0 standard drinks   Drug use: No   Sexual activity: Yes    Partners: Male    Birth control/protection: None  Other Topics Concern   Not on file  Social History Narrative   Not on file   Social Determinants of Health   Financial Resource Strain: Not on file  Food Insecurity: Not on file  Transportation Needs: Not on file  Physical Activity: Not on file  Stress: Not on file  Social Connections: Not on file    ECOG Status: 0 - Asymptomatic  Review of Systems: A 12 point ROS discussed and pertinent positives are indicated in the HPI above.  All other systems are negative.  Review of Systems  Constitutional: Negative.   Respiratory:  Positive for shortness of breath.   Cardiovascular:  Positive for chest pain.  Gastrointestinal:  Positive for abdominal pain.  Genitourinary: Negative.   Musculoskeletal:        Right knee pain.   Vital Signs: BP (!) 166/94 (BP Location: Left Arm)   Pulse 79   LMP  (LMP Unknown) Comment: reports LMP was over a year ago   SpO2 95%   Physical Exam Constitutional:      Appearance: She is not ill-appearing.  Cardiovascular:     Rate and Rhythm: Normal rate and regular rhythm.  Pulmonary:     Effort: Pulmonary effort is normal.     Breath sounds: Normal breath sounds.  Abdominal:     General: Abdomen is flat. Bowel sounds are normal.     Palpations: Abdomen is soft.     Comments: Evidence for a tiny umbilical hernia.  No  evidence for fluid discharge.  Neurological:     General: No focal deficit present.     Mental Status: She is alert and oriented to person, place, and time.       Imaging: MR ABDOMEN WWO CONTRAST  Result Date: 01/31/2022 CLINICAL DATA:  56 year old female with history of secondary hypertension and chronic kidney disease. Tuberous sclerosis. EXAM: MRI ABDOMEN WITHOUT AND WITH CONTRAST TECHNIQUE: Multiplanar multisequence MR imaging of the abdomen was performed both before and after the administration of intravenous contrast. CONTRAST:  76m EOVIST GADOXETATE DISODIUM 0.25 MOL/L IV SOLN COMPARISON:  Abdominal MRI 02/27/2014. FINDINGS: Lower chest: Bilateral  breast implants are incidentally noted. Hepatobiliary: Previously noted hypervascular lesion in segment 6 of the liver is no longer confidently identified on today's examination. Subcentimeter T1 hypointense, T2 hyperintense, nonenhancing lesion in the periphery of segment 8 of the liver is compatible with a small simple cyst. No intra or extrahepatic biliary ductal dilatation. Gallbladder is unremarkable in appearance. Pancreas: No pancreatic mass. No pancreatic ductal dilatation. No pancreatic or peripancreatic fluid collections or inflammatory changes. Spleen:  Unremarkable. Adrenals/Urinary Tract: Left kidney is markedly enlarged and heterogeneous in appearance related to numerous heterogeneously enhancing lesions, many of which have increased in size compared to the prior study, most compatible with multifocal renal cell carcinoma. Some of these lesions also appear to have some intralesional lipid, indicating that one or more may be angiomyolipomas. The most aggressive appearing lesion is in the upper pole of the left kidney (axial image 51 of series 19 and coronal image 32 of series 22) measuring 6.4 x 6.1 x 5.1 cm, extending toward the splenic hilum in the splenorenal ligament. Status post right radical nephrectomy. Bilateral adrenal glands are  normal in appearance. No hydroureteronephrosis in the visualized portions of the abdomen. Stomach/Bowel: Visualized portions are unremarkable. Vascular/Lymphatic: No aneurysm identified in the visualized abdominal vasculature. No lymphadenopathy noted in the abdomen. Other: No significant volume of ascites noted in the visualized portions of the peritoneal cavity. Umbilical hernia containing a small amount of omental fat. Musculoskeletal: No aggressive appearing osseous lesions are noted in the visualized portions of the skeleton. IMPRESSION: 1. Marked enlargement of the left kidney containing numerous enlarging heterogeneously enhancing aggressive appearing lesions concerning for multifocal renal cell carcinoma. Urologic consultation for surgical management is recommended in the near future. 2. Previously noted enhancing lesion in the right lobe of the liver (previously characterized as focal nodular hyperplasia) is no longer readily apparent on today's study, suggesting spontaneous involution. Electronically Signed   By: Vinnie Langton M.D.   On: 01/31/2022 10:29   IR Radiologist Eval & Mgmt  Result Date: 02/24/2022 Please refer to notes tab for details about interventional procedure. (Op Note)      Assessment and Plan:  56 year old with tuberous sclerosis and history of bilateral angiomyolipomas.  Patient has had previous spontaneous renal hemorrhages and undergone previous transarterial embolization.  Patient had a right nephrectomy in 2011 and had coil embolization of left renal arteries in 2011.  No documented spontaneous renal embolization since 2011 but patient has clear enlargement of multiple lesions in the left kidney.  Patient's case was recently discussed at Urology tumor conference and the left renal lesions were thought to be AMLs rather than renal cell carcinomas.  I reviewed the recent MRI and it is difficult to differentiate individual lesions within the left kidney.  Suspect  approximately a dozen lesions in the left kidney.  There is a conglomeration of lesions in the lateral left kidney upper pole that measures approximately 6.5 cm.  There is a second large area along the left kidney mid/lower pole that measures approximately 4.7 cm.  Explained to the patient that AMLs larger than 4 cm are at increased risk for spontaneous hemorrhage.  We discussed treatment options for these enlarging AMLs which include close surveillance and transcatheter arterial embolization of the largest lesions in order to decrease the risk of spontaneous hemorrhage.  Patient is interested in transcatheter arterial embolization at this time.  Left renal arterial anatomy is likely complex based on the multiple lesions and coil embolization of selective branches in 2011.  I recommend  a diagnostic left renal arteriogram to assess the tumor vascularity and viable options for selective particle embolization of the AMLs.  We may be able to perform embolizations during the initial angiogram depending on the imaging findings.  Risks of the procedure including bleeding, infection, vascular injury and decreased renal function were discussed.  In particular, patient understands that any embolization in the left kidney could decrease her overall renal function.  In addition, I explained to the patient that she may need overnight observation after any embolization to manage pain.  Plan for left renal artery angiogram and possible embolization in the upcoming weeks.  Thank you for this interesting consult.  I greatly enjoyed meeting Melissa Ochoa and look forward to participating in their care.  A copy of this report was sent to the requesting provider on this date.  Electronically Signed: Burman Riis 02/24/2022, 3:48 PM   I spent a total of  40 Minutes   in face to face in clinical consultation, greater than 50% of which was counseling/coordinating care for left renal tumors.

## 2022-03-06 ENCOUNTER — Other Ambulatory Visit (HOSPITAL_COMMUNITY): Payer: Self-pay | Admitting: Diagnostic Radiology

## 2022-03-06 DIAGNOSIS — D179 Benign lipomatous neoplasm, unspecified: Secondary | ICD-10-CM

## 2022-03-20 ENCOUNTER — Other Ambulatory Visit: Payer: Self-pay | Admitting: Student

## 2022-03-20 DIAGNOSIS — D1771 Benign lipomatous neoplasm of kidney: Secondary | ICD-10-CM

## 2022-03-22 NOTE — H&P (Signed)
Chief Complaint: Patient was seen in consultation today for left renal lesions at the request of Henn,Adam  Referring Physician(s): Henn,Adam  Supervising Physician: Richarda Overlie  Patient Status: [facility] - Out[facility]pt  History of Present Illness: Melissa Ochoa is a 56 y.o. female with past medical history of tuberous sclerosis and history of bilateral renal angiomyolipomas.  Patient is well-known to IR service having previous transarterial embolization is due to spontaneous renal hemorrhages.  Patient had right nephrectomy in 2011 and coil embolization of left renal arteries in 2011 as well.  Patient's recent MRI showed a conglomeration of lesions in the lateral left upper pole, mid and lower pole of left kidney.  Patient's case was recently discussed at urology tumor conference and the left renal lesions were thought to be angiomyolipomas rather than renal cell carcinomas. Patient had consult with Dr. Richarda Overlie 02/24/2022 where she expressed concern for spontaneous hemorrhage.  At that time Dr. Lowella Dandy recommended diagnostic left renal arteriogram to assess tumor vascularity and options for selective particle embolization of left renal lesions. Patient decided at consultation and presents today to proceed with left renal artery angiogram with possible embolization and possible overnight observation.  Past Medical History:  Diagnosis Date   ADHD (attention deficit hyperactivity disorder)    Anxiety    Bipolar disorder (HCC)    Cervical disc disease    Colon polyps    adenomatous   Depression    Hypertension    Iron deficiency    Kidney disease    renal angiomyolipomas   Liver cyst    Melanosis    Pneumonia    Tuberous sclerosis (HCC)     Past Surgical History:  Procedure Laterality Date   COLONOSCOPY W/ ENDOSCOPIC [place]     IR RADIOLOGIST EVAL & MGMT  02/24/2022   KNEE ARTHROSCOPY WITH DRILLING/MICROFRACTURE Right 09/27/2018   Procedure: Right knee arthroscopy with partial lateral  menisectomy;  Surgeon: Ranee Gosselin, MD;  Location: [facility];  Service: Orthopedics;  Laterality: Right;   NEPHRECTOMY Right    RENAL ARTERY EMBOLIZATION     2 procdures in the left , 4 in the right     TOTAL KNEE ARTHROPLASTY Right 03/27/2019   Procedure: TOTAL KNEE ARTHROPLASTY;  Surgeon: Ollen Gross, MD;  Location: [facility];  Service: Orthopedics;  Laterality: Right;     Allergies: Patient has no known allergies.  Medications: Prior to Admission medications   Medication Sig Start Date End Date Taking? Authorizing Provider  ALPRAZolam Prudy Feeler) 1 MG tablet Take 1 tablet (1 mg total) by mouth 4 (four) times daily as needed for anxiety. 02/05/22   Oletta Darter, MD  amphetamine-dextroamphetamine (ADDERALL) 30 MG tablet Take 1 tablet by mouth 2 (two) times daily. 02/05/22 02/05/23  Oletta Darter, MD  amphetamine-dextroamphetamine (ADDERALL) 30 MG tablet Take 1 tablet by mouth 2 (two) times daily as needed (attention deficit). 02/05/22 02/05/23  Oletta Darter, MD  amphetamine-dextroamphetamine (ADDERALL) 30 MG tablet Take 1 tablet by mouth 2 (two) times daily. 02/05/22 02/05/23  Oletta Darter, MD  labetalol (NORMODYNE) 200 MG tablet Take 400 mg by mouth 3 (three) times daily.     [provider]  lamoTRIgine (LAMICTAL) 100 MG tablet TAKE 1 TABLET(100 MG) BY MOUTH TWICE DAILY 02/05/22   Oletta Darter, MD  losartan (COZAAR) 50 MG tablet Take 50 mg by mouth daily.     [provider]  QUEtiapine (SEROQUEL) 300 MG tablet Take 1 tablet (300 mg total) by mouth at bedtime. 02/05/22  Oletta Darter, MD  sertraline (ZOLOFT) 100 MG tablet TAKE 2 TABLETS(200 MG) BY MOUTH DAILY 02/05/22   Oletta Darter, MD     Family History  Problem Relation Age of Onset   Anxiety disorder Mother    ADD / ADHD Mother    Depression Mother    Lung cancer Father        smoker   Bipolar disorder Daughter    Bipolar disorder Other    Diabetes Other        a lot of relatives   Suicidality  Neg Hx     Social History   Socioeconomic History   Marital status: Married    Spouse name: Not on file   Number of children: Not on file   Years of education: Not on file   Highest education level: Not on file  Occupational History   Not on file  Tobacco Use   Smoking status: Never   Smokeless tobacco: Never  Vaping Use   Vaping Use: Never used  Substance and Sexual Activity   Alcohol use: No    Alcohol/week: 0.0 standard drinks of alcohol   Drug use: No   Sexual activity: Yes    Partners: Male    Birth control/protection: None  Other Topics Concern   Not on file  Social History Narrative   Not on file   Social Determinants of Health   Financial Resource Strain: Not on file  Food Insecurity: Not on file  Transportation Needs: Not on file  Physical Activity: Not on file  Stress: Not on file  Social Connections: Not on file    Review of Systems: A 12 point ROS discussed and pertinent positives are indicated in the HPI above.  All other systems are negative.  Review of Systems  Constitutional:  Negative for chills and fever.  Respiratory:  Negative for shortness of breath.   Cardiovascular:  Negative for chest pain.  Gastrointestinal:  Positive for nausea. Negative for vomiting.  Neurological:  Positive for headaches. Negative for weakness.    Vital Signs: BP (!) 146/88   Pulse 76   Temp 97.6 F (36.4 C) (Oral)   Resp 16   LMP  (LMP Unknown) Comment: reports LMP was over a year ago   SpO2 98%     Physical Exam Vitals reviewed.  Constitutional:      General: She is not in acute distress.    Appearance: Normal appearance. She is not ill-appearing.  HENT:     Head: Normocephalic and atraumatic.     Mouth/Throat:     Mouth: Mucous membranes are dry.     Pharynx: Oropharynx is clear.  Eyes:     Extraocular Movements: Extraocular movements intact.     Pupils: Pupils are equal, round, and reactive to light.  Cardiovascular:     Rate and Rhythm: Normal  rate and regular rhythm.     Pulses: Normal pulses.     Heart sounds: Normal heart sounds.  Pulmonary:     Effort: Pulmonary effort is normal. No respiratory distress.     Breath sounds: Normal breath sounds.  Abdominal:     General: Bowel sounds are normal. There is no distension.     Palpations: Abdomen is soft.     Tenderness: There is no abdominal tenderness. There is no guarding.  Musculoskeletal:     Right lower leg: No edema.     Left lower leg: No edema.  Skin:    General: Skin is warm and dry.  Neurological:     Mental Status: She is alert and oriented to person, place, and time.  Psychiatric:        Mood and Affect: Mood normal.        Behavior: Behavior normal.        Thought Content: Thought content normal.        Judgment: Judgment normal.     Imaging: IR Radiologist Eval & Mgmt  Result Date: 02/24/2022 Please refer to notes tab for details about interventional procedure. (Op Note)   Labs:  CBC: Recent Labs    03/23/22 1155  WBC 4.3  HGB 12.3  HCT 38.4  PLT 246    COAGS: No results for input(s): "INR", "APTT" in the last 8760 hours.  BMP: No results for input(s): "NA", "K", "CL", "CO2", "GLUCOSE", "BUN", "CALCIUM", "CREATININE", "GFRNONAA", "GFRAA" in the last 8760 hours.  Invalid input(s): "CMP"  LIVER FUNCTION TESTS: No results for input(s): "BILITOT", "AST", "ALT", "ALKPHOS", "PROT", "ALBUMIN" in the last 8760 hours.  TUMOR MARKERS: No results for input(s): "AFPTM", "CEA", "CA199", "CHROMGRNA" in the last 8760 hours.  Assessment and Plan: History of tuberous sclerosis and history of bilateral renal angiomyolipomas.  Patient is well-known to IR service having previous transarterial embolization is due to spontaneous renal hemorrhages.  Patient had right nephrectomy in 2011 and coil embolization of left renal arteries in 2011 as well.  Patient's recent MRI showed a conglomeration of lesions in the lateral left upper pole, mid and lower pole of  left kidney.  Patient's case was recently discussed at urology tumor conference and the left renal lesions were thought to be angiomyolipomas rather than renal cell carcinomas. Patient had consult with Dr. Richarda Overlie 02/24/2022 where she expressed concern for spontaneous hemorrhage.  At that time Dr. Lowella Dandy recommended diagnostic left renal arteriogram to assess tumor vascularity and options for selective particle embolization of left renal lesions. Patient decided at consultation and presents today to proceed with left renal artery angiogram with possible embolization and possible overnight observation.  Pt resting on stretcher. She is A&O, calm and pleasant.  She is in no distress.  Pt states she is NPO per order.  She denies the use of AC/AP medications.   Risks and benefits of left renal arteriogram with possible embolization of left renal lesion with moderate sedation were discussed with the patient including, but not limited to bleeding, infection, vascular injury, contrast induced renal failure, stroke, reperfusion hemorrhage, or even death. Risks and benefits of embolization were discussed with the patient including, but not limited to bleeding, infection, vascular injury, post operative pain, or contrast induced renal failure.  This interventional procedure involves the use of X-rays and because of the nature of the planned procedure, it is possible that we will have prolonged use of X-ray fluoroscopy. Potential radiation risks to you include (but are not limited to) the following: - A slightly elevated risk for cancer  several years later in life. This risk is typically less than 0.5% percent. This risk is low in comparison to the normal incidence of human cancer, which is 33% for women and 50% for men according to the [employer]. - Radiation induced injury can include skin redness, resembling a rash, tissue breakdown / ulcers and hair loss (which can be temporary or permanent).  The  likelihood of either of these occurring depends on the difficulty of the procedure and whether you are sensitive to radiation due to previous procedures, disease, or genetic conditions.  IF your procedure  requires a prolonged use of radiation, you will be notified and given written instructions for further action.  It is your responsibility to monitor the irradiated area for the 2 weeks following the procedure and to notify your physician if you are concerned that you have suffered a radiation induced injury.   All of the patient's questions were answered, patient is agreeable to proceed.  Consent signed and in chart.   Thank you for this interesting consult.  I greatly enjoyed meeting Keni V Mcgreal and look forward to participating in their care.  A copy of this report was sent to the requesting provider on this date.  Electronically Signed: Shon Hough, NP 03/23/2022, 12:51 PM   I spent a total of 20 minutes  in face to face in clinical consultation, greater than 50% of which was counseling/coordinating care for left renal lesions.

## 2022-03-23 ENCOUNTER — Ambulatory Visit (HOSPITAL_COMMUNITY)
Admission: RE | Admit: 2022-03-23 | Discharge: 2022-03-23 | Disposition: A | Discharge status: Home / Self Care | Payer: Medicare Other | Source: Ambulatory Visit | Attending: Diagnostic Radiology | Admitting: Diagnostic Radiology

## 2022-03-23 ENCOUNTER — Encounter (HOSPITAL_COMMUNITY): Payer: Self-pay

## 2022-03-23 ENCOUNTER — Other Ambulatory Visit (HOSPITAL_COMMUNITY): Payer: Self-pay | Admitting: Diagnostic Radiology

## 2022-03-23 DIAGNOSIS — D179 Benign lipomatous neoplasm, unspecified: Secondary | ICD-10-CM

## 2022-03-23 DIAGNOSIS — D1771 Benign lipomatous neoplasm of kidney: Secondary | ICD-10-CM

## 2022-03-23 DIAGNOSIS — Z905 Acquired absence of kidney: Secondary | ICD-10-CM | POA: Diagnosis not present

## 2022-03-23 DIAGNOSIS — Q851 Tuberous sclerosis: Secondary | ICD-10-CM | POA: Insufficient documentation

## 2022-03-23 DIAGNOSIS — N2889 Other specified disorders of kidney and ureter: Secondary | ICD-10-CM | POA: Diagnosis not present

## 2022-03-23 HISTORY — PX: IR RENAL SUPRASEL UNI S&I MOD SED: IMG655

## 2022-03-23 HISTORY — PX: IR US GUIDE VASC ACCESS RIGHT: IMG2390

## 2022-03-23 LAB — CBC WITH DIFFERENTIAL/PLATELET
Abs Immature Granulocytes: 0.04 10*3/uL (ref 0.00–0.07)
Basophils Absolute: 0.1 10*3/uL (ref 0.0–0.1)
Basophils Relative: 1 %
Eosinophils Absolute: 0.2 10*3/uL (ref 0.0–0.5)
Eosinophils Relative: 5 %
HCT: 38.4 % (ref 36.0–46.0)
Hemoglobin: 12.3 g/dL (ref 12.0–15.0)
Immature Granulocytes: 1 %
Lymphocytes Relative: 31 %
Lymphs Abs: 1.3 10*3/uL (ref 0.7–4.0)
MCH: 29.5 pg (ref 26.0–34.0)
MCHC: 32 g/dL (ref 30.0–36.0)
MCV: 92.1 fL (ref 80.0–100.0)
Monocytes Absolute: 0.3 10*3/uL (ref 0.1–1.0)
Monocytes Relative: 8 %
Neutro Abs: 2.3 10*3/uL (ref 1.7–7.7)
Neutrophils Relative %: 54 %
Platelets: 246 10*3/uL (ref 150–400)
RBC: 4.17 MIL/uL (ref 3.87–5.11)
RDW: 13.9 % (ref 11.5–15.5)
WBC: 4.3 10*3/uL (ref 4.0–10.5)
nRBC: 0 % (ref 0.0–0.2)

## 2022-03-23 LAB — BASIC METABOLIC PANEL
Anion gap: 7 (ref 5–15)
BUN: 23 mg/dL — ABNORMAL HIGH (ref 6–20)
CO2: 27 mmol/L (ref 22–32)
Calcium: 9.4 mg/dL (ref 8.9–10.3)
Chloride: 107 mmol/L (ref 98–111)
Creatinine, Ser: 0.87 mg/dL (ref 0.44–1.00)
GFR, Estimated: >60 mL/min (ref >60)
Glucose, Bld: 90 mg/dL (ref 70–99)
Potassium: 3.8 mmol/L (ref 3.5–5.1)
Sodium: 141 mmol/L (ref 135–145)

## 2022-03-23 LAB — PROTIME-INR
INR: 0.9 (ref 0.8–1.2)
Prothrombin Time: 12 seconds (ref 11.4–15.2)

## 2022-03-23 MED ORDER — IOHEXOL 300 MG/ML  SOLN
100.0000 mL | Freq: Once | INTRAMUSCULAR | Status: AC | PRN
Start: 2022-03-23 — End: 2022-03-23
  Administered 2022-03-23: 45 mL via INTRA_ARTERIAL

## 2022-03-23 MED ORDER — MIDAZOLAM HCL 2 MG/2ML IJ SOLN
INTRAMUSCULAR | Status: AC
  Filled 2022-03-23: qty 4

## 2022-03-23 MED ORDER — HYDROCODONE-ACETAMINOPHEN 5-325 MG PO TABS
1.0000 | ORAL_TABLET | ORAL | Status: DC | PRN
  Administered 2022-03-23: 1 via ORAL

## 2022-03-23 MED ORDER — PROMETHAZINE HCL 12.5 MG PO TABS: 12.5000 mg | ORAL_TABLET | Freq: Four times a day (QID) | ORAL | 0 refills | Status: DC | PRN

## 2022-03-23 MED ORDER — SODIUM CHLORIDE 0.9 % IV SOLN: INTRAVENOUS | Status: DC

## 2022-03-23 MED ORDER — IOHEXOL 300 MG/ML  SOLN
100.0000 mL | Freq: Once | INTRAMUSCULAR | Status: AC | PRN
  Administered 2022-03-23: 45 mL via INTRA_ARTERIAL

## 2022-03-23 MED ORDER — SODIUM CHLORIDE 0.9% FLUSH: 3.0000 mL | INTRAVENOUS | Status: DC | PRN

## 2022-03-23 MED ORDER — SODIUM CHLORIDE 0.9% FLUSH
3.0000 mL | Freq: Two times a day (BID) | INTRAVENOUS | Status: DC
Start: 2022-03-23 — End: 2022-03-24

## 2022-03-23 MED ORDER — MIDAZOLAM HCL 2 MG/2ML IJ SOLN
INTRAMUSCULAR | Status: AC | PRN
  Administered 2022-03-23: 1 mg via INTRAVENOUS
  Administered 2022-03-23 (×2): .5 mg via INTRAVENOUS

## 2022-03-23 MED ORDER — LIDOCAINE HCL 1 % IJ SOLN
INTRAMUSCULAR | Status: AC
  Filled 2022-03-23: qty 20

## 2022-03-23 MED ORDER — IOHEXOL 300 MG/ML  SOLN
50.0000 mL | Freq: Once | INTRAMUSCULAR | Status: AC | PRN
  Administered 2022-03-23: 45 mL via INTRA_ARTERIAL

## 2022-03-23 MED ORDER — PROMETHAZINE HCL 25 MG/ML IJ SOLN
INTRAMUSCULAR | Status: AC
  Filled 2022-03-23: qty 1

## 2022-03-23 MED ORDER — SODIUM CHLORIDE 0.9 % IV SOLN
12.5000 mg | INTRAVENOUS | Status: AC
Start: 2022-03-23 — End: 2022-03-23
  Administered 2022-03-23: 12.5 mg via INTRAVENOUS
  Filled 2022-03-23: qty 0.5

## 2022-03-23 MED ORDER — SODIUM CHLORIDE 0.9 % IV SOLN
INTRAVENOUS | Status: AC | PRN
  Administered 2022-03-23: 100 mL/h via INTRAVENOUS

## 2022-03-23 MED ORDER — FENTANYL CITRATE (PF) 100 MCG/2ML IJ SOLN
INTRAMUSCULAR | Status: AC | PRN
  Administered 2022-03-23 (×2): 25 ug via INTRAVENOUS
  Administered 2022-03-23: 50 ug via INTRAVENOUS

## 2022-03-23 MED ORDER — HYDROCODONE-ACETAMINOPHEN 5-325 MG PO TABS
ORAL_TABLET | ORAL | Status: AC
  Filled 2022-03-23: qty 1

## 2022-03-23 MED ORDER — PROMETHAZINE HCL 25 MG RE SUPP: 25.0000 mg | Freq: Three times a day (TID) | RECTAL | Status: DC | PRN

## 2022-03-23 MED ORDER — PROMETHAZINE HCL 25 MG PO TABS: 25.0000 mg | ORAL_TABLET | Freq: Three times a day (TID) | ORAL | Status: DC | PRN

## 2022-03-23 MED ORDER — ACETAMINOPHEN 500 MG PO TABS: 500.0000 mg | ORAL_TABLET | Freq: Four times a day (QID) | ORAL | Status: DC | PRN

## 2022-03-23 MED ORDER — FENTANYL CITRATE (PF) 100 MCG/2ML IJ SOLN
INTRAMUSCULAR | Status: AC
  Filled 2022-03-23: qty 4

## 2022-03-23 MED ORDER — SODIUM CHLORIDE 0.9 % IV SOLN: 250.0000 mL | INTRAVENOUS | Status: AC

## 2022-03-23 MED ORDER — KETOROLAC TROMETHAMINE 30 MG/ML IJ SOLN
INTRAMUSCULAR | Status: DC
Start: 2022-03-23 — End: 2022-03-23
  Filled 2022-03-23: qty 1

## 2022-03-23 NOTE — Procedures (Signed)
Interventional Radiology Procedure:   Indications: Tuberous sclerosis with solitary left kidney and multiple AMLs  Procedure: Left renal arteriogram with [facility] Beam CT  Findings: Abnormal vascularity throughout the left kidney with selective angiography of upper pole arteries.  Mapped out vascularity to upper pole tumors.  Right groin sheath removed with AngioSeal closure device.   Complications: No immediate complications noted.     EBL: Minimal  Plan: Hydrate and plan for discharge this evening.  Will review angiography and discuss selective angiography with Urology and Nephrology.     Melissa Caraher R. Lowella Dandy, MD  Pager: [phone]

## 2022-04-14 ENCOUNTER — Other Ambulatory Visit: Payer: Self-pay | Admitting: Diagnostic Radiology

## 2022-04-14 DIAGNOSIS — N2889 Other specified disorders of kidney and ureter: Secondary | ICD-10-CM

## 2022-04-24 ENCOUNTER — Ambulatory Visit
Admission: RE | Admit: 2022-04-24 | Discharge: 2022-04-24 | Disposition: A | Discharge status: Home / Self Care | Payer: Medicare Other | Source: Ambulatory Visit | Attending: Diagnostic Radiology | Admitting: Diagnostic Radiology

## 2022-04-24 DIAGNOSIS — N2889 Other specified disorders of kidney and ureter: Secondary | ICD-10-CM | POA: Diagnosis not present

## 2022-04-24 DIAGNOSIS — Q851 Tuberous sclerosis: Secondary | ICD-10-CM | POA: Diagnosis not present

## 2022-04-24 NOTE — Progress Notes (Signed)
Chief Complaint: Patient was consulted remotely today (TeleHealth) for follow-up of left renal angiomyolipomas  Referring Physician(s): Coladonato, Broadus John   History of Present Illness: Melissa Ochoa is a 56 y.o. female with a tuberous sclerosis and history of bilateral renal angiomyolipomas.  History of spontaneous renal hemorrhages and status post bilateral renal artery embolizations and had a right nephrectomy in March 2011.  Last embolization procedure was in the left kidney on 11/06/2009.  Patient had follow-up MRI on 01/30/2022 demonstrating marked enlargement of the left renal lesions.  Patient underwent a diagnostic left renal arteriogram on 03/23/2022.  Angiography demonstrated complex anatomy with infiltrative lesions.  Based on the angiogram, I wanted the patient to follow-up with Dr. [name] in nephrology prior to performing any embolization to her solitary left kidney.  Patient has not seen nephrology since the arteriogram.  Patient has been complaining of bilateral flank pain which she believes is muscular in etiology.  She has no urinary symptoms.  She denies dysuria or hematuria.  She is concerned that she has been recently dehydrated.  Past Medical History:  Diagnosis Date   ADHD (attention deficit hyperactivity disorder)    Anxiety    Bipolar disorder (HCC)    Cervical disc disease    Colon polyps    adenomatous   Depression    Hypertension    Iron deficiency    Kidney disease    renal angiomyolipomas   Liver cyst    Melanosis    Pneumonia    Tuberous sclerosis ([place])     Past Surgical History:  Procedure Laterality Date   COLONOSCOPY W/ ENDOSCOPIC [place]     IR RADIOLOGIST EVAL & MGMT  02/24/2022   IR RENAL SUPRASEL UNI S&I MOD SED  03/23/2022   IR US GUIDE VASC ACCESS RIGHT  03/23/2022   KNEE ARTHROSCOPY WITH DRILLING/MICROFRACTURE Right 09/27/2018   Procedure: Right knee arthroscopy with partial lateral menisectomy;  Surgeon: Latanya Maudlin, MD;  Location:  [facility];  Service: Orthopedics;  Laterality: Right;   NEPHRECTOMY Right    RENAL ARTERY EMBOLIZATION     2 procdures in the left , 4 in the right     TOTAL KNEE ARTHROPLASTY Right 03/27/2019   Procedure: TOTAL KNEE ARTHROPLASTY;  Surgeon: Gaynelle Arabian, MD;  Location: [facility];  Service: Orthopedics;  Laterality: Right;  67mn    Allergies: Patient has no known allergies.  Medications: Prior to Admission medications   Medication Sig Start Date End Date Taking? Authorizing Provider  ALPRAZolam (Duanne Moron 1 MG tablet Take 1 tablet (1 mg total) by mouth 4 (four) times daily as needed for anxiety. 02/05/22   ACharlcie Cradle MD  amphetamine-dextroamphetamine (ADDERALL) 30 MG tablet Take 1 tablet by mouth 2 (two) times daily. 02/05/22 02/05/23  ACharlcie Cradle MD  amphetamine-dextroamphetamine (ADDERALL) 30 MG tablet Take 1 tablet by mouth 2 (two) times daily as needed (attention deficit). 02/05/22 02/05/23  ACharlcie Cradle MD  amphetamine-dextroamphetamine (ADDERALL) 30 MG tablet Take 1 tablet by mouth 2 (two) times daily. 02/05/22 02/05/23  ACharlcie Cradle MD  labetalol (NORMODYNE) 200 MG tablet Take 400 mg by mouth 3 (three) times daily.     [provider]  lamoTRIgine (LAMICTAL) 100 MG tablet TAKE 1 TABLET(100 MG) BY MOUTH TWICE DAILY 02/05/22   ACharlcie Cradle MD  losartan (COZAAR) 50 MG tablet Take 50 mg by mouth daily.     [provider]  promethazine (PHENERGAN) 12.5 MG tablet Take 1 tablet (12.5 mg total) by mouth every 6 (six)  hours as needed for nausea or vomiting. 03/23/22   Tyson Alias, NP  QUEtiapine (SEROQUEL) 300 MG tablet Take 1 tablet (300 mg total) by mouth at bedtime. 02/05/22   Charlcie Cradle, MD  sertraline (ZOLOFT) 100 MG tablet TAKE 2 TABLETS(200 MG) BY MOUTH DAILY 02/05/22   Charlcie Cradle, MD     Family History  Problem Relation Age of Onset   Anxiety disorder Mother    ADD / ADHD Mother    Depression Mother    Lung cancer Father        smoker    Bipolar disorder Daughter    Bipolar disorder Other    Diabetes Other        a lot of relatives   Suicidality Neg Hx     Social History   Socioeconomic History   Marital status: Married    Spouse name: Not on file   Number of children: Not on file   Years of education: Not on file   Highest education level: Not on file  Occupational History   Not on file  Tobacco Use   Smoking status: Never   Smokeless tobacco: Never  Vaping Use   Vaping Use: Never used  Substance and Sexual Activity   Alcohol use: No    Alcohol/week: 0.0 standard drinks of alcohol   Drug use: No   Sexual activity: Yes    Partners: Male    Birth control/protection: None  Other Topics Concern   Not on file  Social History Narrative   Not on file   Social Determinants of Health   Financial Resource Strain: Not on file  Food Insecurity: Not on file  Transportation Needs: Not on file  Physical Activity: Not on file  Stress: Not on file  Social Connections: Not on file     Review of Systems  Constitutional: Negative.   Gastrointestinal:  Positive for abdominal pain.  Genitourinary: Negative.   Musculoskeletal:  Positive for back pain.     Physical exam: No direct physical exam was performed    Imaging: INDICATION: 56 year old with tuberous sclerosis and history of multiple renal angiomyolipomas. Patient has had a right nephrectomy and previous coil embolization in the left kidney for bleeding angiomyolipomas. Plan for diagnostic renal arteriogram for future tumor embolization.   EXAM: 1. LEFT RENAL ARTERIOGRAM WITH SELECTIVE ANGIOGRAPHY OF LEFT UPPER POLE RENAL ARTERIES 2. ULTRASOUND GUIDANCE FOR VASCULAR ACCESS   MEDICATIONS: Moderate sedation   ANESTHESIA/SEDATION: Moderate (conscious) sedation was employed during this procedure. A total of Versed 2.0 mg and Fentanyl 100 mcg was administered intravenously by the radiology nurse.   Total intra-service moderate Sedation Time: 90  minutes. The patient's level of consciousness and vital signs were monitored continuously by radiology nursing throughout the procedure under my direct supervision.   CONTRAST:  135 mL Omnipaque 300   FLUOROSCOPY: Radiation Exposure Index (as provided by the fluoroscopic device): 1834 mGy Kerma   COMPLICATIONS: None immediate.   PROCEDURE: Informed consent was obtained from the patient following explanation of the procedure, risks, benefits and alternatives. The patient understands, agrees and consents for the procedure. All questions were addressed. A time out was performed prior to the initiation of the procedure.   Ultrasound confirmed a patent right common femoral artery. Ultrasound image was saved for documentation. Right groin was prepped and draped in sterile fashion. Maximal barrier sterile technique was utilized including caps, mask, sterile gowns, sterile gloves, sterile drape, hand hygiene and skin antiseptic. Skin was anesthetized with 1% lidocaine.  A small incision was made. Using ultrasound guidance, 21 gauge needle was directed into the right common femoral artery and a 5 French Terumo sheath was advanced over a wire. C2 catheter was initially advanced into the abdominal aorta and it was difficult to cannulate the left renal artery. The C2 catheter was exchanged for a Sos catheter. Sos catheter was used to cannulate the left renal artery and series of left renal arteriograms were performed through the 5 French catheter. Largest tumor burden appeared to be in the upper pole based on previous MRI and high-flow Renegade catheter was used to cannulate the upper pole right renal artery and additional angiography obtained. Catheter advanced into a dominant lateral artery and selective angiography was performed in this area. The catheter was pulled back into the main upper pole renal artery and cone beam CT was performed. Microcatheter was removed. The Sos catheter was  removed from the left renal artery and removed over a Bentson wire. Angiogram was performed through the right groin sheath. Right groin sheath was removed using an Angio-Seal closure device. Right groin hemostasis at the end the procedure. Bandage was placed.   FINDINGS: Left renal artery is widely patent without stenosis. There are embolization coils in the medial left kidney lower pole and within the lateral aspect of the left kidney interpolar region. No large aneurysm or pseudoaneurysm formations. Abnormal vascularity in the upper pole and lateral aspect of the left kidney interpolar region compatible with tumor vascularity. Largest tumor vascularity is in the lateral upper pole area. Selective angiography in the left upper pole demonstrates relatively well-defined tumor vascularity corresponding with 1 of the larger lesions seen on the previous MRI. Cone Beam CT was obtained with the catheter in the upper pole renal artery and there was filling of numerous tumors throughout the left kidney upper pole.   IMPRESSION: 1. Abnormal vascularity throughout the left kidney compatible with numerous angiomyolipomas. The largest lesions are in the left kidney upper pole along the lateral aspect and there appears to be a dominant lesion in the lateral left interpolar region. Selective angiography was performed in the left kidney upper pole renal artery. 2. Review angiographic images and plan to discuss treatment options with urology and nephrology.     Electronically Signed   By: Markus Daft M.D.   On: 03/23/2022 18:04  Labs:  CBC: Recent Labs    03/23/22 1155  WBC 4.3  HGB 12.3  HCT 38.4  PLT 246    COAGS: Recent Labs    03/23/22 1155  INR 0.9    BMP: Recent Labs    03/23/22 1155  NA 141  K 3.8  CL 107  CO2 27  GLUCOSE 90  BUN 23*  CALCIUM 9.4  CREATININE 0.87  GFRNONAA >60     Assessment and Plan:  56 year old female with tuberous sclerosis and  history of ruptured bilateral renal angiomyolipomas.  Patient had a right nephrectomy and now has multiple enlarged left renal lesions.  Recent arteriogram demonstrates complex left renal artery anatomy with infiltrative lesions.  Largest lesions are currently in the left kidney upper pole.  Plan to treat these renal lesions separately and wait several months between treatments in order to monitor and preserve renal function.  Plan to treat the dominant lesion in the left lateral upper pole first.  Would like to coordinate these treatments and plans with Dr. [name] in nephrology.  Patient reports that she is scheduled to see nephrology on May 05, 2022.  We will  tentatively schedule the patient for left renal artery embolization in the third or fourth week of August.  Thank you for this interesting consult.  I greatly enjoyed meeting Melissa Ochoa and look forward to participating in their care.  A copy of this report was sent to the requesting provider on this date.  Electronically Signed: Burman Riis 04/24/2022, 1:33 PM   I spent a total of    5 Minutes in remote  clinical consultation, greater than 50% of which was counseling/coordinating care for left renal angiomyolipomas.    Visit type: Audio only (telephone). Audio (no video) only due to technical limitations. Alternative for in-person consultation at [facility], [address] [place], [place], [place]. This visit type was conducted due to national recommendations for restrictions regarding the COVID-19 Pandemic (e.g. social distancing).  This format is felt to be most appropriate for this patient at this time.  All issues noted in this document were discussed and addressed.   Patient ID: Melissa Ochoa, female   DOB: Mar 19, 1966, 56 y.o.   MRN: [id number]

## 2022-04-30 ENCOUNTER — Other Ambulatory Visit (HOSPITAL_COMMUNITY): Payer: Self-pay | Admitting: Diagnostic Radiology

## 2022-04-30 DIAGNOSIS — D1771 Benign lipomatous neoplasm of kidney: Secondary | ICD-10-CM

## 2022-04-30 DIAGNOSIS — N2889 Other specified disorders of kidney and ureter: Secondary | ICD-10-CM

## 2022-05-05 DIAGNOSIS — D631 Anemia in chronic kidney disease: Secondary | ICD-10-CM | POA: Diagnosis not present

## 2022-05-05 DIAGNOSIS — N182 Chronic kidney disease, stage 2 (mild): Secondary | ICD-10-CM | POA: Diagnosis not present

## 2022-05-05 DIAGNOSIS — D179 Benign lipomatous neoplasm, unspecified: Secondary | ICD-10-CM | POA: Diagnosis not present

## 2022-05-05 DIAGNOSIS — I129 Hypertensive chronic kidney disease with stage 1 through stage 4 chronic kidney disease, or unspecified chronic kidney disease: Secondary | ICD-10-CM | POA: Diagnosis not present

## 2022-05-07 ENCOUNTER — Telehealth (HOSPITAL_BASED_OUTPATIENT_CLINIC_OR_DEPARTMENT_OTHER): Payer: Medicare Other | Admitting: Psychiatry

## 2022-05-07 DIAGNOSIS — F3130 Bipolar disorder, current episode depressed, mild or moderate severity, unspecified: Secondary | ICD-10-CM

## 2022-05-07 DIAGNOSIS — F411 Generalized anxiety disorder: Secondary | ICD-10-CM

## 2022-05-07 DIAGNOSIS — F900 Attention-deficit hyperactivity disorder, predominantly inattentive type: Secondary | ICD-10-CM | POA: Diagnosis not present

## 2022-05-07 MED ORDER — AMPHETAMINE-DEXTROAMPHETAMINE 30 MG PO TABS: 30.0000 mg | ORAL_TABLET | Freq: Two times a day (BID) | ORAL | 0 refills | Status: DC

## 2022-05-07 MED ORDER — SERTRALINE HCL 100 MG PO TABS: ORAL_TABLET | ORAL | 0 refills | Status: DC

## 2022-05-07 MED ORDER — ALPRAZOLAM 1 MG PO TABS: 1.0000 mg | ORAL_TABLET | Freq: Four times a day (QID) | ORAL | 2 refills | Status: DC | PRN

## 2022-05-07 MED ORDER — AMPHETAMINE-DEXTROAMPHETAMINE 30 MG PO TABS: 30.0000 mg | ORAL_TABLET | Freq: Two times a day (BID) | ORAL | 0 refills | Status: DC | PRN

## 2022-05-07 MED ORDER — LAMOTRIGINE 100 MG PO TABS: ORAL_TABLET | ORAL | 0 refills | Status: DC

## 2022-05-07 MED ORDER — QUETIAPINE FUMARATE 300 MG PO TABS: 300.0000 mg | ORAL_TABLET | Freq: Every day | ORAL | 0 refills | Status: DC

## 2022-05-07 NOTE — Progress Notes (Signed)
Virtual Visit via Phone Note  I connected with Melissa Ochoa on 05/07/22 at  1:00 PM EDT by phone. She is currently driving so we opted not to do a video visit.   I verified that I am speaking with the correct person using two identifiers.  Location: Patient: driving in car Provider: office   I discussed the limitations of evaluation and management by telemedicine and the availability of in person appointments. The patient expressed understanding and agreed to proceed.  History of Present Illness: Melissa Ochoa is currently driving her grand daughter to her doctor's appointment. She is rushed today. She shared that she had 12 lesions on her left kidney. She is going to have a procedure at the of the month and then re-analyze her kidney function. It has got her feeling anxious. Her youngest daughter is not doing well and Melissa Ochoa is very worried about her. Due to all the stress she is feeling a lot more tired than usual. Her sleep is not a good as usual due to worrying when she wakes in the middle of the night. Melissa Ochoa is depressed and often feels helpless in regards to her daughter's suffering. She is endorsing some increase in anhedonia and low motivation. Melissa Ochoa has a lot of negative self talk. This week she ate a lot of junk food which is unusual for her. Melissa Ochoa shares that she has experienced pressured speech, increased irritability, inability to focus or complete things about 5 times since our last visit. It lasts for a few hours at a time. She denies passive thoughts of death and denies SI/HI. Her ADHD responds well to Adderall.   Observations/Objective: Psychiatric Specialty Exam:  General Appearance: unable to assess  Eye Contact:  unable to assess  Speech:  Clear and Coherent and Normal Rate  Volume:  Normal  Mood:  Anxious and Depressed  Affect:  Congruent  Thought Process:  Coherent and Descriptions of Associations: Circumstantial  Orientation:  Full (Time, Place, and Person)   Thought Content:  Rumination  Suicidal Thoughts:  No  Homicidal Thoughts:  No  Memory:  Immediate;   Good  Judgement:  Good  Insight:  Good  Psychomotor Activity: unable to assess  Concentration:  Concentration: Fair  Recall:  Good  Fund of Knowledge:  Good  Language:  Good  Akathisia:  unable to assess  Handed:  unable to assess  AIMS (if indicated):     Assets:  Communication Skills Desire for Improvement Financial Resources/Insurance Housing Intimacy Leisure Time Resilience Social Support Talents/Skills Transportation Vocational/Educational  ADL's:  unable to assess  Cognition:  WNL  Sleep:         Assessment and Plan:     05/07/2022    1:17 PM 02/05/2022    3:29 PM 01/01/2022   11:07 AM 08/28/2021    1:48 PM 03/13/2021    2:37 PM  Depression screen PHQ 2/9  Decreased Interest 2 0 1 2 0  Down, Depressed, Hopeless '3 1 3 2 1  '$ PHQ - 2 Score '5 1 4 4 1  '$ Altered sleeping 3 0 1 2   Tired, decreased energy '3 2 2 1   '$ Change in appetite '1 2 2 '$ 0   Feeling bad or failure about yourself  '3 2 3 3   '$ Trouble concentrating 1 0 2 0   Moving slowly or fidgety/restless 0 0 0 0   Suicidal thoughts 0 0 0 0   PHQ-9 Score '16 7 14 10   '$ Difficult doing work/chores Extremely  dIfficult Somewhat difficult Extremely dIfficult Very difficult     Flowsheet Row Video Visit from 05/07/2022 in [place] from 03/23/2022 in [place] Video Visit from 02/05/2022 in [place] No Risk No Risk Error: Q3, 4, or 5 should not be populated when Q2 is No       Pt is aware that these meds carry a teratogenic risk. Pt will discuss plan of action if she does or plans to become pregnant in the future.  Status of current problems: ongoing problems with depression and anxiety  Meds: Melissa Ochoa does not want her meds changed today.   1. GAD (generalized anxiety disorder) - ALPRAZolam (XANAX) 1 MG tablet; Take 1 tablet (1 mg total) by mouth 4 (four) times daily as needed for anxiety.  Dispense: 120 tablet; Refill: 2 - QUEtiapine (SEROQUEL) 300 MG tablet; Take 1 tablet (300 mg total) by mouth at bedtime.  Dispense: 90 tablet; Refill: 0 - sertraline (ZOLOFT) 100 MG tablet; TAKE 2 TABLETS(200 MG) BY MOUTH DAILY  Dispense: 180 tablet; Refill: 0  2. Bipolar I disorder, most recent episode depressed (HCC) - lamoTRIgine (LAMICTAL) 100 MG tablet; TAKE 1 TABLET(100 MG) BY MOUTH TWICE DAILY  Dispense: 180 tablet; Refill: 0 - QUEtiapine (SEROQUEL) 300 MG tablet; Take 1 tablet (300 mg total) by mouth at bedtime.  Dispense: 90 tablet; Refill: 0 - sertraline (ZOLOFT) 100 MG tablet; TAKE 2 TABLETS(200 MG) BY MOUTH DAILY  Dispense: 180 tablet; Refill: 0  3. Attention deficit hyperactivity disorder (ADHD), predominantly inattentive type - amphetamine-dextroamphetamine (ADDERALL) 30 MG tablet; Take 1 tablet by mouth 2 (two) times daily.  Dispense: 60 tablet; Refill: 0 - amphetamine-dextroamphetamine (ADDERALL) 30 MG tablet; Take 1 tablet by mouth 2 (two) times daily as needed (attention deficit).  Dispense: 60 tablet; Refill: 0 - amphetamine-dextroamphetamine (ADDERALL) 30 MG tablet; Take 1 tablet by mouth 2 (two) times daily.  Dispense: 60 tablet; Refill: 0     Labs: none today    Therapy: brief supportive therapy provided. Discussed psychosocial stressors in detail.    Collaboration of Care: Other none  Patient/Guardian was advised Release of Information must be obtained prior to any record release in order to collaborate their care with an outside provider. Patient/Guardian was advised if they have not already done so to contact the registration department to sign all necessary forms in order for [place] to release information regarding their care.   Consent: Patient/Guardian gives verbal consent for treatment and assignment of benefits  for services provided during this visit. Patient/Guardian expressed understanding and agreed to proceed.     Follow Up Instructions: Follow up in 2-3 months or sooner if needed    I discussed the assessment and treatment plan with the patient. The patient was provided an opportunity to ask questions and all were answered. The patient agreed with the plan and demonstrated an understanding of the instructions.   The patient was advised to call back or seek an in-person evaluation if the symptoms worsen or if the condition fails to improve as anticipated.  I provided 20 minutes of non-face-to-face time during this encounter.   Charlcie Cradle, MD

## 2022-05-12 ENCOUNTER — Telehealth (HOSPITAL_COMMUNITY): Payer: Self-pay | Admitting: *Deleted

## 2022-05-12 NOTE — Telephone Encounter (Signed)
Pt called requesting an early refill of the Alprazolam 1 mg tid prn. [place] is going [place] and will run out of medication soon. Rx was filled at [facility] on [address] on 04/18/22. Pt is leaving on Friday, 05/15/22, and would like medication sent to [facility] sent then if possible. Pt next appointment is scheduled for 07/30/22. Please review and advise.

## 2022-05-14 NOTE — Telephone Encounter (Signed)
Will do!

## 2022-05-27 ENCOUNTER — Other Ambulatory Visit: Payer: Self-pay | Admitting: Physician Assistant

## 2022-05-27 DIAGNOSIS — D1771 Benign lipomatous neoplasm of kidney: Secondary | ICD-10-CM

## 2022-05-28 ENCOUNTER — Encounter (HOSPITAL_COMMUNITY): Payer: Self-pay

## 2022-05-28 ENCOUNTER — Other Ambulatory Visit (HOSPITAL_COMMUNITY): Payer: Self-pay | Admitting: Diagnostic Radiology

## 2022-05-28 ENCOUNTER — Ambulatory Visit (HOSPITAL_COMMUNITY)
Admission: RE | Admit: 2022-05-28 | Discharge: 2022-05-28 | Disposition: A | Discharge status: Home / Self Care | Payer: Medicare Other | Source: Ambulatory Visit | Attending: Diagnostic Radiology | Admitting: Diagnostic Radiology

## 2022-05-28 ENCOUNTER — Observation Stay (HOSPITAL_COMMUNITY)
Admission: RE | Admit: 2022-05-28 | Discharge: 2022-05-29 | Disposition: A | Discharge status: Home / Self Care | Payer: Medicare Other | Source: Ambulatory Visit | Attending: Diagnostic Radiology | Admitting: Diagnostic Radiology

## 2022-05-28 ENCOUNTER — Other Ambulatory Visit: Payer: Self-pay

## 2022-05-28 VITALS — BP 151/94 | HR 72 | Temp 98.0°F | Resp 18 | Ht 69.0 in | Wt 216.1 lb

## 2022-05-28 DIAGNOSIS — Q851 Tuberous sclerosis: Secondary | ICD-10-CM | POA: Diagnosis not present

## 2022-05-28 DIAGNOSIS — F319 Bipolar disorder, unspecified: Secondary | ICD-10-CM | POA: Insufficient documentation

## 2022-05-28 DIAGNOSIS — Z905 Acquired absence of kidney: Secondary | ICD-10-CM | POA: Insufficient documentation

## 2022-05-28 DIAGNOSIS — D1771 Benign lipomatous neoplasm of kidney: Secondary | ICD-10-CM | POA: Diagnosis not present

## 2022-05-28 DIAGNOSIS — F419 Anxiety disorder, unspecified: Secondary | ICD-10-CM | POA: Insufficient documentation

## 2022-05-28 DIAGNOSIS — I10 Essential (primary) hypertension: Secondary | ICD-10-CM | POA: Insufficient documentation

## 2022-05-28 DIAGNOSIS — N2889 Other specified disorders of kidney and ureter: Secondary | ICD-10-CM

## 2022-05-28 DIAGNOSIS — F909 Attention-deficit hyperactivity disorder, unspecified type: Secondary | ICD-10-CM | POA: Insufficient documentation

## 2022-05-28 HISTORY — PX: IR RENAL SUPRASEL UNI S&I MOD SED: IMG655

## 2022-05-28 HISTORY — PX: IR EMBO TUMOR ORGAN ISCHEMIA INFARCT INC GUIDE ROADMAPPING: IMG5449

## 2022-05-28 HISTORY — PX: IR RENAL SELECTIVE  UNI INC S&I MOD SED: IMG654

## 2022-05-28 HISTORY — PX: IR US GUIDE VASC ACCESS RIGHT: IMG2390

## 2022-05-28 LAB — CBC
HCT: 40.1 % (ref 36.0–46.0)
Hemoglobin: 12.7 g/dL (ref 12.0–15.0)
MCH: 28.9 pg (ref 26.0–34.0)
MCHC: 31.7 g/dL (ref 30.0–36.0)
MCV: 91.1 fL (ref 80.0–100.0)
Platelets: 257 10*3/uL (ref 150–400)
RBC: 4.4 MIL/uL (ref 3.87–5.11)
RDW: 13.9 % (ref 11.5–15.5)
WBC: 4.9 10*3/uL (ref 4.0–10.5)
nRBC: 0 % (ref 0.0–0.2)

## 2022-05-28 LAB — BASIC METABOLIC PANEL
Anion gap: 6 (ref 5–15)
BUN: 26 mg/dL — ABNORMAL HIGH (ref 6–20)
CO2: 27 mmol/L (ref 22–32)
Calcium: 9.3 mg/dL (ref 8.9–10.3)
Chloride: 107 mmol/L (ref 98–111)
Creatinine, Ser: 0.92 mg/dL (ref 0.44–1.00)
GFR, Estimated: >60 mL/min (ref >60)
Glucose, Bld: 98 mg/dL (ref 70–99)
Potassium: 4.2 mmol/L (ref 3.5–5.1)
Sodium: 140 mmol/L (ref 135–145)

## 2022-05-28 LAB — PROTIME-INR
INR: 0.9 (ref 0.8–1.2)
Prothrombin Time: 11.8 seconds (ref 11.4–15.2)

## 2022-05-28 MED ORDER — IODIXANOL 320 MG/ML IV SOLN
50.0000 mL | Freq: Once | INTRAVENOUS | Status: AC | PRN
  Administered 2022-05-28: 12 mL via INTRA_ARTERIAL

## 2022-05-28 MED ORDER — SODIUM CHLORIDE 0.9 % IV SOLN
INTRAVENOUS | Status: DC
Start: 2022-05-28 — End: 2022-05-29

## 2022-05-28 MED ORDER — CEFAZOLIN SODIUM-DEXTROSE 2-4 GM/100ML-% IV SOLN
INTRAVENOUS | Status: AC
  Filled 2022-05-28: qty 100

## 2022-05-28 MED ORDER — FENTANYL CITRATE (PF) 100 MCG/2ML IJ SOLN
INTRAMUSCULAR | Status: AC
  Filled 2022-05-28: qty 4

## 2022-05-28 MED ORDER — MIDAZOLAM HCL 2 MG/2ML IJ SOLN
INTRAMUSCULAR | Status: AC
  Filled 2022-05-28: qty 2

## 2022-05-28 MED ORDER — LAMOTRIGINE 100 MG PO TABS
100.0000 mg | ORAL_TABLET | Freq: Two times a day (BID) | ORAL | Status: DC
  Administered 2022-05-28 – 2022-05-29 (×3): 100 mg via ORAL
  Filled 2022-05-28 (×3): qty 1

## 2022-05-28 MED ORDER — ALPRAZOLAM 0.5 MG PO TABS
1.0000 mg | ORAL_TABLET | Freq: Four times a day (QID) | ORAL | Status: DC | PRN
  Administered 2022-05-28: 1 mg via ORAL
  Filled 2022-05-28: qty 2

## 2022-05-28 MED ORDER — SODIUM CHLORIDE 0.9 % IV SOLN: 250.0000 mL | INTRAVENOUS | Status: DC | PRN

## 2022-05-28 MED ORDER — DOCUSATE SODIUM 100 MG PO CAPS
100.0000 mg | ORAL_CAPSULE | Freq: Two times a day (BID) | ORAL | Status: DC
  Administered 2022-05-28 – 2022-05-29 (×3): 100 mg via ORAL
  Filled 2022-05-28 (×3): qty 1

## 2022-05-28 MED ORDER — NALOXONE HCL 0.4 MG/ML IJ SOLN
INTRAMUSCULAR | Status: AC
  Filled 2022-05-28: qty 1

## 2022-05-28 MED ORDER — SODIUM CHLORIDE 0.9% FLUSH: 3.0000 mL | INTRAVENOUS | Status: DC | PRN

## 2022-05-28 MED ORDER — SODIUM CHLORIDE 0.9 % IV SOLN: INTRAVENOUS | Status: DC

## 2022-05-28 MED ORDER — IODIXANOL 320 MG/ML IV SOLN
50.0000 mL | Freq: Once | INTRAVENOUS | Status: AC | PRN
  Administered 2022-05-28: 11 mL via INTRA_ARTERIAL

## 2022-05-28 MED ORDER — FENTANYL CITRATE (PF) 100 MCG/2ML IJ SOLN
INTRAMUSCULAR | Status: AC
  Filled 2022-05-28: qty 2

## 2022-05-28 MED ORDER — FLUMAZENIL 0.5 MG/5ML IV SOLN
INTRAVENOUS | Status: AC
  Filled 2022-05-28: qty 5

## 2022-05-28 MED ORDER — SODIUM CHLORIDE 0.9% FLUSH
3.0000 mL | Freq: Two times a day (BID) | INTRAVENOUS | Status: DC
Start: 2022-05-28 — End: 2022-05-29
  Administered 2022-05-28 – 2022-05-29 (×3): 3 mL via INTRAVENOUS

## 2022-05-28 MED ORDER — PROMETHAZINE HCL 25 MG RE SUPP: 25.0000 mg | Freq: Three times a day (TID) | RECTAL | Status: DC | PRN

## 2022-05-28 MED ORDER — ONDANSETRON HCL 4 MG/2ML IJ SOLN
4.0000 mg | Freq: Four times a day (QID) | INTRAMUSCULAR | Status: DC | PRN
  Administered 2022-05-29: 4 mg via INTRAVENOUS
  Filled 2022-05-28: qty 2

## 2022-05-28 MED ORDER — SERTRALINE HCL 100 MG PO TABS
100.0000 mg | ORAL_TABLET | Freq: Two times a day (BID) | ORAL | Status: DC
  Administered 2022-05-28 – 2022-05-29 (×3): 100 mg via ORAL
  Filled 2022-05-28 (×3): qty 1

## 2022-05-28 MED ORDER — AMPHETAMINE-DEXTROAMPHETAMINE 30 MG PO TABS: 30.0000 mg | ORAL_TABLET | Freq: Two times a day (BID) | ORAL | Status: DC

## 2022-05-28 MED ORDER — QUETIAPINE FUMARATE 50 MG PO TABS
300.0000 mg | ORAL_TABLET | Freq: Every day | ORAL | Status: DC
  Administered 2022-05-28: 300 mg via ORAL
  Filled 2022-05-28: qty 6

## 2022-05-28 MED ORDER — FENTANYL CITRATE (PF) 100 MCG/2ML IJ SOLN
INTRAMUSCULAR | Status: AC | PRN
  Administered 2022-05-28 (×2): 50 ug via INTRAVENOUS
  Administered 2022-05-28: 25 ug via INTRAVENOUS
  Administered 2022-05-28 (×3): 50 ug via INTRAVENOUS
  Administered 2022-05-28: 25 ug via INTRAVENOUS

## 2022-05-28 MED ORDER — LABETALOL HCL 100 MG PO TABS
300.0000 mg | ORAL_TABLET | Freq: Three times a day (TID) | ORAL | Status: DC
  Administered 2022-05-28 – 2022-05-29 (×3): 300 mg via ORAL
  Filled 2022-05-28 (×3): qty 3

## 2022-05-28 MED ORDER — IOHEXOL 350 MG/ML SOLN
100.0000 mL | Freq: Once | INTRAVENOUS | Status: AC | PRN
  Administered 2022-05-28: 25 mL via INTRA_ARTERIAL

## 2022-05-28 MED ORDER — LOSARTAN POTASSIUM 50 MG PO TABS
50.0000 mg | ORAL_TABLET | Freq: Every day | ORAL | Status: DC
  Administered 2022-05-28 – 2022-05-29 (×2): 50 mg via ORAL
  Filled 2022-05-28 (×2): qty 1

## 2022-05-28 MED ORDER — HYDROCODONE-ACETAMINOPHEN 5-325 MG PO TABS
1.0000 | ORAL_TABLET | ORAL | Status: DC | PRN
  Administered 2022-05-28 (×2): 2 via ORAL
  Administered 2022-05-28: 1 via ORAL
  Administered 2022-05-29: 2 via ORAL
  Filled 2022-05-28 (×2): qty 2
  Filled 2022-05-28: qty 1
  Filled 2022-05-28: qty 2

## 2022-05-28 MED ORDER — MIDAZOLAM HCL 2 MG/2ML IJ SOLN
INTRAMUSCULAR | Status: AC
  Filled 2022-05-28: qty 4

## 2022-05-28 MED ORDER — MIDAZOLAM HCL 2 MG/2ML IJ SOLN
INTRAMUSCULAR | Status: AC | PRN
  Administered 2022-05-28: .5 mg via INTRAVENOUS
  Administered 2022-05-28 (×2): 1 mg via INTRAVENOUS
  Administered 2022-05-28: .5 mg via INTRAVENOUS
  Administered 2022-05-28: 1 mg via INTRAVENOUS

## 2022-05-28 MED ORDER — PROMETHAZINE HCL 25 MG PO TABS: 25.0000 mg | ORAL_TABLET | Freq: Three times a day (TID) | ORAL | Status: DC | PRN

## 2022-05-28 MED ORDER — LIDOCAINE HCL 1 % IJ SOLN
INTRAMUSCULAR | Status: AC
  Administered 2022-05-28: 10 mL via SUBCUTANEOUS
  Filled 2022-05-28: qty 20

## 2022-05-28 MED ORDER — CEFAZOLIN SODIUM-DEXTROSE 2-4 GM/100ML-% IV SOLN: 2.0000 g | INTRAVENOUS | Status: AC

## 2022-05-28 NOTE — Sedation Documentation (Signed)
Pedal pulses 2+ dorsalis and 1+ posterior bilateral.

## 2022-05-28 NOTE — Progress Notes (Addendum)
Patient ID: Melissa Ochoa, female   DOB: [birth year], 56 y.o.   MRN: [id number] Pt status post left renal arteriogram with particle embolization of dominant left renal angiomyolipoma earlier today.  Currently with mild headache, some dull left flank discomfort , not worsening.  BP 136/110; puncture site right common femoral artery soft, clean, dry, no hematoma, minimal tenderness.  Intact distal pulses.  Abdomen soft, nontender. Resume home meds, hydrate, for overnight obs; check am BMP; checked with Dr. Elissa Hefty office for labetalol dosing and they currently have patient taking 300 mg 3 times daily.

## 2022-05-28 NOTE — H&P (Signed)
Referring Physician(s): Coladonato,J/Pace,M  Supervising Physician: Markus Daft  Patient Status:  [facility] TBA  Chief Complaint:  Left renal angiomyolipomas  Subjective: Pt known to IR service from embolization of right renal angiomyolipomas in 2004 and 2005, left renal AML embolization in 2011, and left renal arteriogram on 03/23/2022.  Is a 56 year old female with past medical history significant for ADHD, anxiety, bipolar disorder, cervical disc disease, colon polyps, depression, hypertension, iron deficiency, osteoarthritis, melanosis, tuberous sclerosis and bilateral renal angiomyolipomas with prior spontaneous renal hemorrhages. She underwent right nephrectomy in March 2011.  Latest imaging has revealed marked enlargement of the left renal lesions.  Largest lesions are currently in the left kidney upper pole.  Following discussions with Dr. Anselm Pancoast plans are to initially treat/embolize the dominant lesion in the left lateral upper pole .  She presents today for the procedure. She currently denies fever, chest pain, dyspnea, cough, abdominal/back pain, nausea, vomiting, hematuria, dysuria.  She does have a mild headache.  Past Medical History:  Diagnosis Date   ADHD (attention deficit hyperactivity disorder)    Anxiety    Bipolar disorder (HCC)    Cervical disc disease    Colon polyps    adenomatous   Depression    Hypertension    Iron deficiency    Kidney disease    renal angiomyolipomas   Liver cyst    Melanosis    Pneumonia    Tuberous sclerosis ([place])    Past Surgical History:  Procedure Laterality Date   COLONOSCOPY W/ ENDOSCOPIC [place]     IR RADIOLOGIST EVAL & MGMT  02/24/2022   IR RENAL SUPRASEL UNI S&I MOD SED  03/23/2022   IR US GUIDE VASC ACCESS RIGHT  03/23/2022   KNEE ARTHROSCOPY WITH DRILLING/MICROFRACTURE Right 09/27/2018   Procedure: Right knee arthroscopy with partial lateral menisectomy;  Surgeon: Latanya Maudlin, MD;  Location: [facility];  Service: Orthopedics;   Laterality: Right;   NEPHRECTOMY Right    RENAL ARTERY EMBOLIZATION     2 procdures in the left , 4 in the right     TOTAL KNEE ARTHROPLASTY Right 03/27/2019   Procedure: TOTAL KNEE ARTHROPLASTY;  Surgeon: Gaynelle Arabian, MD;  Location: [facility];  Service: Orthopedics;  Laterality: Right;  60mn      Allergies: Patient has no known allergies.  Medications: Prior to Admission medications   Medication Sig Start Date End Date Taking? Authorizing Provider  ALPRAZolam (Duanne Moron 1 MG tablet Take 1 tablet (1 mg total) by mouth 4 (four) times daily as needed for anxiety. 05/07/22   ACharlcie Cradle MD  amphetamine-dextroamphetamine (ADDERALL) 30 MG tablet Take 1 tablet by mouth 2 (two) times daily. 05/07/22 05/07/23  ACharlcie Cradle MD  amphetamine-dextroamphetamine (ADDERALL) 30 MG tablet Take 1 tablet by mouth 2 (two) times daily as needed (attention deficit). 05/07/22 05/07/23  ACharlcie Cradle MD  amphetamine-dextroamphetamine (ADDERALL) 30 MG tablet Take 1 tablet by mouth 2 (two) times daily. 05/07/22 05/07/23  ACharlcie Cradle MD  labetalol (NORMODYNE) 200 MG tablet Take 400 mg by mouth 3 (three) times daily.     [provider]  lamoTRIgine (LAMICTAL) 100 MG tablet TAKE 1 TABLET(100 MG) BY MOUTH TWICE DAILY 05/07/22   ACharlcie Cradle MD  losartan (COZAAR) 50 MG tablet Take 50 mg by mouth daily.     [provider]  promethazine (PHENERGAN) 12.5 MG tablet Take 1 tablet (12.5 mg total) by mouth every 6 (six) hours as needed for nausea or vomiting. 03/23/22   CTyson Alias  NP  QUEtiapine (SEROQUEL) 300 MG tablet Take 1 tablet (300 mg total) by mouth at bedtime. 05/07/22   Charlcie Cradle, MD  sertraline (ZOLOFT) 100 MG tablet TAKE 2 TABLETS(200 MG) BY MOUTH DAILY 05/07/22   Charlcie Cradle, MD     Vital Signs: BP 130/81   Pulse 73   Temp 97.9 F (36.6 C) (Oral)   Resp 19   Ht '5\' 9"'$  (1.753 m)   Wt 195 lb (88.5 kg)   LMP  (LMP Unknown) Comment: reports LMP was over a year ago    SpO2 97%   BMI 28.80 kg/m   Physical Exam awake, alert.  Chest clear to auscultation bilaterally.  Heart with regular rate and rhythm.  Abdomen soft, positive bowel sounds, nontender.  No lower extremity edema.  Intact distal pulses.  Imaging: No results found.  Labs:  CBC: Recent Labs    03/23/22 1155 05/28/22 0819  WBC 4.3 4.9  HGB 12.3 12.7  HCT 38.4 40.1  PLT 246 257    COAGS: Recent Labs    03/23/22 1155  INR 0.9    BMP: Recent Labs    03/23/22 1155  NA 141  K 3.8  CL 107  CO2 27  GLUCOSE 90  BUN 23*  CALCIUM 9.4  CREATININE 0.87  GFRNONAA >60    LIVER FUNCTION TESTS: No results for input(s): "BILITOT", "AST", "ALT", "ALKPHOS", "PROT", "ALBUMIN" in the last 8760 hours.  Assessment and Plan: Pt known to IR service from embolization of right renal angiomyolipomas in 2004 and 2005, left renal AML embolization in 2011, and left renal arteriogram on 03/23/2022.  Is a 56 year old female with past medical history significant for ADHD, anxiety, bipolar disorder, cervical disc disease, colon polyps, depression, hypertension, iron deficiency, osteoarthritis, melanosis, tuberous sclerosis and bilateral renal angiomyolipomas with prior spontaneous renal hemorrhages. She underwent right nephrectomy in March 2011.  Latest imaging has revealed marked enlargement of the left renal lesions.  Largest lesions are currently in the left kidney upper pole.  Following discussions with Dr. Anselm Pancoast plans are to initially treat/embolize the dominant lesion in the left lateral upper pole .  She presents today for the procedure.Risks and benefits of procedure were discussed with the patient including, but not limited to bleeding, infection, vascular injury or contrast induced renal failure.  This interventional procedure involves the use of X-rays and because of the nature of the planned procedure, it is possible that we will have prolonged use of X-ray fluoroscopy.  Potential radiation  risks to you include (but are not limited to) the following: - A slightly elevated risk for cancer  several years later in life. This risk is typically less than 0.5% percent. This risk is low in comparison to the normal incidence of human cancer, which is 33% for women and 50% for men according to the [place]. - Radiation induced injury can include skin redness, resembling a rash, tissue breakdown / ulcers and hair loss (which can be temporary or permanent).   The likelihood of either of these occurring depends on the difficulty of the procedure and whether you are sensitive to radiation due to previous procedures, disease, or genetic conditions.   IF your procedure requires a prolonged use of radiation, you will be notified and given written instructions for further action.  It is your responsibility to monitor the irradiated area for the 2 weeks following the procedure and to notify your physician if you are concerned that you have suffered a radiation induced injury.  All of the patient's questions were answered, patient is agreeable to proceed.  Consent signed and in chart. Postprocedure pt will be admitted for overnight observation.    LABS PENDING      Electronically Signed: D. Rowe Robert, PA-C 05/28/2022, 8:36 AM   I spent a total of 30 minutes at the the patient's bedside AND on the patient's hospital floor or unit, greater than 50% of which was counseling/coordinating care for left renal arteriogram with embolization of left  renal angiomyolipoma

## 2022-05-28 NOTE — Procedures (Signed)
Interventional Radiology Procedure:   Indications: Solitary left kidney with multiple AMLs  Procedure: Left renal arteriography with particle embolization of dominant AML  Findings: Selective arteriography demonstrates the dominant mass in left kidney lateral upper pole.  Tumor was successfully embolized with 1 vial of Embospheres (300-500 micron).  Right groin closure with Angioseal.  Complications: No immediate complications noted.     EBL: Minimal  Plan: Overnight observation. Bedrest 3 hours and continue with IV hydration and encourage po intake.   Pain control as needed.     Reinette Cuneo R. Anselm Pancoast, MD  Pager: [phone]

## 2022-05-29 DIAGNOSIS — D1771 Benign lipomatous neoplasm of kidney: Secondary | ICD-10-CM | POA: Diagnosis not present

## 2022-05-29 DIAGNOSIS — F909 Attention-deficit hyperactivity disorder, unspecified type: Secondary | ICD-10-CM | POA: Diagnosis not present

## 2022-05-29 DIAGNOSIS — Z905 Acquired absence of kidney: Secondary | ICD-10-CM | POA: Diagnosis not present

## 2022-05-29 DIAGNOSIS — F319 Bipolar disorder, unspecified: Secondary | ICD-10-CM | POA: Diagnosis not present

## 2022-05-29 DIAGNOSIS — I10 Essential (primary) hypertension: Secondary | ICD-10-CM | POA: Diagnosis not present

## 2022-05-29 DIAGNOSIS — F419 Anxiety disorder, unspecified: Secondary | ICD-10-CM | POA: Diagnosis not present

## 2022-05-29 LAB — BASIC METABOLIC PANEL
Anion gap: 7 (ref 5–15)
BUN: 20 mg/dL (ref 6–20)
CO2: 26 mmol/L (ref 22–32)
Calcium: 9.1 mg/dL (ref 8.9–10.3)
Chloride: 109 mmol/L (ref 98–111)
Creatinine, Ser: 0.86 mg/dL (ref 0.44–1.00)
GFR, Estimated: >60 mL/min (ref >60)
Glucose, Bld: 91 mg/dL (ref 70–99)
Potassium: 4 mmol/L (ref 3.5–5.1)
Sodium: 142 mmol/L (ref 135–145)

## 2022-05-29 MED ORDER — PROMETHAZINE HCL 25 MG PO TABS: 12.5000 mg | ORAL_TABLET | Freq: Three times a day (TID) | ORAL | 0 refills | Status: DC | PRN

## 2022-05-29 MED ORDER — HYDROCODONE-ACETAMINOPHEN 5-325 MG PO TABS: 1.0000 | ORAL_TABLET | ORAL | 0 refills | Status: AC | PRN

## 2022-05-29 MED ORDER — DOCUSATE SODIUM 100 MG PO CAPS: 100.0000 mg | ORAL_CAPSULE | Freq: Two times a day (BID) | ORAL | 0 refills | Status: DC

## 2022-05-29 NOTE — Discharge Summary (Signed)
Patient ID: Melissa Ochoa MRN: [id number] DOB/AGE: 58-28-1967 58 y.o.  Admit date: 05/28/2022 Discharge date: 05/29/2022  Supervising Physician: Markus Daft  Patient Status: [facility] - In-pt  Admission Diagnoses:Angiomyolipoma of left kidney  Discharge Diagnoses:  Principal Problem:   Angiomyolipoma of left kidney   Discharged Condition: good  Hospital Course: Pt underwent mapping thorough left renal arteriogram 03/23/22 with Dr. Anselm Pancoast. Pt presented for left renal arteriogram for embolization of  left angiomyolipoma with Dr. Anselm Pancoast 05/28/22. Arteriography demonstrated a dominant mass in left kidney lateral upper pole. Tumor was successfully embolized. Pt was observed overnight. She c/o mild HA, some nausea and 7/10 pain controlled with pain medications. Currently patient is stable and ready for discharge home.  Consults: None  Significant Diagnostic Studies: radiology: Interventional radiology  Treatments: procedures: Embolization of angiomyolipoma of left kidney  Discharge Exam: Blood pressure (!) 151/94, pulse 72, temperature 98 F (36.7 C), temperature source Oral, resp. rate 18, height '5\' 9"'$  (1.753 m), weight 216 lb 0.8 oz (98 kg), SpO2 98 %. General appearance: alert Resp: clear to auscultation bilaterally Cardio: regular rate and rhythm, S1, S2 normal, no murmur, click, rub or gallop GI: normal findings: soft, non-tender Skin: Skin color, texture, turgor normal. No rashes or lesions or normal Site is soft with no active bleeding and no appreciable pseudoaneurysm. Dressing is C/D/I.  Disposition: Discharge disposition: 01-Home or Self Care       Discharge Instructions     Call MD for:   Complete by: As directed    Please notify MD if you experience pain with urination, blood in urine, frequent urination, pelvic pain, back pain or other signs of urinary tract infection.   Call MD for:  difficulty breathing, headache or visual disturbances   Complete by: As directed     Call MD for:  extreme fatigue   Complete by: As directed    Call MD for:  hives   Complete by: As directed    Call MD for:  persistant dizziness or light-headedness   Complete by: As directed    Call MD for:  persistant nausea and vomiting   Complete by: As directed    Call MD for:  redness, tenderness, or signs of infection (pain, swelling, redness, odor or green/yellow discharge around incision site)   Complete by: As directed    Call MD for:  severe uncontrolled pain   Complete by: As directed    Call MD for:  temperature >100.4   Complete by: As directed    Diet - low sodium heart healthy   Complete by: As directed    Discharge instructions   Complete by: As directed    Hydrate well. No lifting more that 10 lbs for 5 days.   Driving Restrictions   Complete by: As directed    No driving for 24 hours while taking narcotic pain medications   Increase activity slowly   Complete by: As directed    Lifting restrictions   Complete by: As directed    Do not lift more that 10 lbs for 5 days   Other Restrictions   Complete by: As directed    Do not drive for 24 hours or while taking narcotic pain medication.   Remove dressing in 24 hours   Complete by: As directed    Keep puncture site clean and dry. You may replace with bandage.      Allergies as of 05/29/2022   No Known Allergies  Medication List     TAKE these medications    ALPRAZolam 1 MG tablet Commonly known as: XANAX Take 1 tablet (1 mg total) by mouth 4 (four) times daily as needed for anxiety. What changed: when to take this   amphetamine-dextroamphetamine 30 MG tablet Commonly known as: Adderall Take 1 tablet by mouth 2 (two) times daily.   amphetamine-dextroamphetamine 30 MG tablet Commonly known as: Adderall Take 1 tablet by mouth 2 (two) times daily as needed (attention deficit).   amphetamine-dextroamphetamine 30 MG tablet Commonly known as: Adderall Take 1 tablet by mouth 2 (two) times  daily.   cyclobenzaprine 10 MG tablet Commonly known as: FLEXERIL Take 10 mg by mouth every 8 (eight) hours as needed for muscle spasms.   docusate sodium 100 MG capsule Commonly known as: COLACE Take 1 capsule (100 mg total) by mouth 2 (two) times daily.   HYDROcodone-acetaminophen 5-325 MG tablet Commonly known as: NORCO/VICODIN Take 1-2 tablets by mouth every 4 (four) hours as needed for up to 5 days for moderate pain.   labetalol 300 MG tablet Commonly known as: NORMODYNE Take 600 mg by mouth 3 (three) times daily.   lamoTRIgine 100 MG tablet Commonly known as: LAMICTAL TAKE 1 TABLET(100 MG) BY MOUTH TWICE DAILY   losartan 50 MG tablet Commonly known as: COZAAR Take 50 mg by mouth daily.   promethazine 12.5 MG tablet Commonly known as: PHENERGAN Take 1 tablet (12.5 mg total) by mouth every 6 (six) hours as needed for nausea or vomiting. What changed: Another medication with the same name was added. Make sure you understand how and when to take each.   promethazine 25 MG tablet Commonly known as: PHENERGAN Take 0.5 tablets (12.5 mg total) by mouth every 8 (eight) hours as needed for nausea. What changed: You were already taking a medication with the same name, and this prescription was added. Make sure you understand how and when to take each.   QUEtiapine 300 MG tablet Commonly known as: SEROQUEL Take 1 tablet (300 mg total) by mouth at bedtime.   sertraline 100 MG tablet Commonly known as: ZOLOFT TAKE 2 TABLETS(200 MG) BY MOUTH DAILY What changed:  how much to take how to take this when to take this additional instructions        Follow-up Information     Markus Daft, MD Follow up in 2 week(s).   Specialties: Interventional Radiology, Radiology Why: Follow up telephone visit with Dr. Anselm Pancoast s/p embolization of Angiomyolipoma of left kidney Contact information: [address] [place] [address] [phone]                   Electronically Signed: Tyson Alias, NP 05/29/2022, 1:45 PM   I have spent Greater Than 30 Minutes discharging [place].

## 2022-05-29 NOTE — Progress Notes (Signed)
Transition of Care (TOC) Screening Note  Patient Details  Name: Melissa Ochoa Date of Birth: 18-Jul-1966  Transition of Care [facility]) CM/SW Contact:    Sherie Don, LCSW Phone Number: 05/29/2022, 10:18 AM  Transition of Care Department [facility]) has reviewed patient and no TOC needs have been identified at this time. We will continue to monitor patient advancement through interdisciplinary progression rounds. If new patient transition needs arise, please place a TOC consult.

## 2022-05-29 NOTE — Plan of Care (Signed)
?  Problem: Clinical Measurements: ?Goal: Will remain free from infection ?Outcome: Progressing ?  ?

## 2022-06-27 ENCOUNTER — Other Ambulatory Visit (HOSPITAL_COMMUNITY): Payer: Self-pay | Admitting: Diagnostic Radiology

## 2022-06-27 ENCOUNTER — Encounter (HOSPITAL_COMMUNITY): Payer: Self-pay | Admitting: Radiology

## 2022-07-14 DIAGNOSIS — I129 Hypertensive chronic kidney disease with stage 1 through stage 4 chronic kidney disease, or unspecified chronic kidney disease: Secondary | ICD-10-CM | POA: Diagnosis not present

## 2022-07-14 DIAGNOSIS — D179 Benign lipomatous neoplasm, unspecified: Secondary | ICD-10-CM | POA: Diagnosis not present

## 2022-07-14 DIAGNOSIS — N189 Chronic kidney disease, unspecified: Secondary | ICD-10-CM | POA: Diagnosis not present

## 2022-07-14 DIAGNOSIS — D631 Anemia in chronic kidney disease: Secondary | ICD-10-CM | POA: Diagnosis not present

## 2022-07-14 DIAGNOSIS — N182 Chronic kidney disease, stage 2 (mild): Secondary | ICD-10-CM | POA: Diagnosis not present

## 2022-07-21 ENCOUNTER — Ambulatory Visit
Admission: RE | Admit: 2022-07-21 | Discharge: 2022-07-21 | Disposition: A | Discharge status: Home / Self Care | Payer: Medicare Other | Source: Ambulatory Visit | Attending: Internal Medicine | Admitting: Internal Medicine

## 2022-07-21 DIAGNOSIS — N2889 Other specified disorders of kidney and ureter: Secondary | ICD-10-CM | POA: Diagnosis not present

## 2022-07-21 DIAGNOSIS — Q851 Tuberous sclerosis: Secondary | ICD-10-CM | POA: Diagnosis not present

## 2022-07-21 DIAGNOSIS — D1771 Benign lipomatous neoplasm of kidney: Secondary | ICD-10-CM

## 2022-07-21 HISTORY — PX: IR RADIOLOGIST EVAL & MGMT: IMG5224

## 2022-07-21 NOTE — Progress Notes (Signed)
Chief Complaint: Patient was consulted remotely today (TeleHealth) for follow-up of left renal angiomyolipoma embolization.  Referring Physician(s): [facility]  History of Present Illness: Melissa Ochoa is a 56 y.o. female with tuberous sclerosis and history of bilateral renal angiomyolipomas.  History of prior spontaneous renal hemorrhages and had a right nephrectomy in 2011.  Previous embolizations to the left kidney.  Patient was noted to have multiple left renal lesions on MRI from 01/30/2022 and the patient underwent particle embolization of a dominant AML in the left kidney upper pole on 05/28/2022.  The procedure was technically successful and the patient was kept overnight for observation after the procedure.  Patient did well following the procedure with some nausea and mild pain.  Patient reports that she had a fever 1 or 2 days after the embolization that was self-limiting.  She denies hematuria or dysuria.  However, the patient reports at least 3 instances of falling since the procedure.  She also had prolonged diarrhea over the past month but this has resolved.  Patient also complains of fatigue and requiring a lot of sleep.  She is scheduled to leave the country on a vacation in early December.  Patient recently saw her nephrologist Dr. [name] and reportedly her kidney function is within normal limits although I do not have the lab results.  Past Medical History:  Diagnosis Date   ADHD (attention deficit hyperactivity disorder)    Anxiety    Bipolar disorder (HCC)    Cervical disc disease    Colon polyps    adenomatous   Depression    Hypertension    Iron deficiency    Kidney disease    renal angiomyolipomas   Liver cyst    Melanosis    Pneumonia    Tuberous sclerosis ([place])     Past Surgical History:  Procedure Laterality Date   COLONOSCOPY W/ ENDOSCOPIC [place]     IR EMBO TUMOR ORGAN ISCHEMIA INFARCT INC GUIDE ROADMAPPING   05/28/2022   IR RADIOLOGIST EVAL & MGMT  02/24/2022   IR RENAL SUPRASEL UNI S&I MOD SED  03/23/2022   IR RENAL SUPRASEL UNI S&I MOD SED  05/28/2022   IR US GUIDE VASC ACCESS RIGHT  03/23/2022   IR US GUIDE VASC ACCESS RIGHT  05/28/2022   KNEE ARTHROSCOPY WITH DRILLING/MICROFRACTURE Right 09/27/2018   Procedure: Right knee arthroscopy with partial lateral menisectomy;  Surgeon: Latanya Maudlin, MD;  Location: [facility];  Service: Orthopedics;  Laterality: Right;   NEPHRECTOMY Right    RENAL ARTERY EMBOLIZATION     2 procdures in the left , 4 in the right     TOTAL KNEE ARTHROPLASTY Right 03/27/2019   Procedure: TOTAL KNEE ARTHROPLASTY;  Surgeon: Gaynelle Arabian, MD;  Location: [facility];  Service: Orthopedics;  Laterality: Right;  76mn    Allergies: Patient has no known allergies.  Medications: Prior to Admission medications   Medication Sig Start Date End Date Taking? Authorizing Provider  ALPRAZolam (Duanne Moron 1 MG tablet Take 1 tablet (1 mg total) by mouth 4 (four) times daily as needed for anxiety. Patient taking differently: Take 1 mg by mouth 3 (three) times daily. 05/07/22   ACharlcie Cradle MD  amphetamine-dextroamphetamine (ADDERALL) 30 MG tablet Take 1 tablet by mouth 2 (two) times daily. 05/07/22 05/07/23  ACharlcie Cradle MD  amphetamine-dextroamphetamine (ADDERALL) 30 MG tablet Take 1 tablet by mouth 2 (two) times daily as needed (attention deficit). 05/07/22 05/07/23  ACharlcie Cradle MD  amphetamine-dextroamphetamine (  ADDERALL) 30 MG tablet Take 1 tablet by mouth 2 (two) times daily. 05/07/22 05/07/23  Charlcie Cradle, MD  cyclobenzaprine (FLEXERIL) 10 MG tablet Take 10 mg by mouth every 8 (eight) hours as needed for muscle spasms. 05/21/22   [provider]  docusate sodium (COLACE) 100 MG capsule Take 1 capsule (100 mg total) by mouth 2 (two) times daily. 05/29/22   Tyson Alias, NP  labetalol (NORMODYNE) 300 MG tablet Take 600 mg by mouth 3 (three) times daily. 04/05/22   [provider]  lamoTRIgine (LAMICTAL) 100 MG tablet TAKE 1 TABLET(100 MG) BY MOUTH TWICE DAILY 05/07/22   Charlcie Cradle, MD  losartan (COZAAR) 50 MG tablet Take 50 mg by mouth daily.     [provider]  promethazine (PHENERGAN) 12.5 MG tablet Take 1 tablet (12.5 mg total) by mouth every 6 (six) hours as needed for nausea or vomiting. 03/23/22   Tyson Alias, NP  promethazine (PHENERGAN) 25 MG tablet Take 0.5 tablets (12.5 mg total) by mouth every 8 (eight) hours as needed for nausea. 05/29/22   Tyson Alias, NP  QUEtiapine (SEROQUEL) 300 MG tablet Take 1 tablet (300 mg total) by mouth at bedtime. 05/07/22   Charlcie Cradle, MD  sertraline (ZOLOFT) 100 MG tablet TAKE 2 TABLETS(200 MG) BY MOUTH DAILY Patient taking differently: Take 100 mg by mouth in the morning and at bedtime. 05/07/22   Charlcie Cradle, MD     Family History  Problem Relation Age of Onset   Anxiety disorder Mother    ADD / ADHD Mother    Depression Mother    Lung cancer Father        smoker   Bipolar disorder Daughter    Bipolar disorder Other    Diabetes Other        a lot of relatives   Suicidality Neg Hx     Social History   Socioeconomic History   Marital status: Married    Spouse name: Not on file   Number of children: Not on file   Years of education: Not on file   Highest education level: Not on file  Occupational History   Not on file  Tobacco Use   Smoking status: Never   Smokeless tobacco: Never  Vaping Use   Vaping Use: Never used  Substance and Sexual Activity   Alcohol use: No    Alcohol/week: 0.0 standard drinks of alcohol   Drug use: No   Sexual activity: Yes    Partners: Male    Birth control/protection: None  Other Topics Concern   Not on file  Social History Narrative   Not on file   Social Determinants of Health   Financial Resource Strain: Not on file  Food Insecurity: Not on file  Transportation Needs: Not on file  Physical Activity: Not on file  Stress: Not on  file  Social Connections: Not on file    Review of Systems  Constitutional:  Positive for fatigue and unexpected weight change.  Gastrointestinal:  Positive for diarrhea.  Genitourinary: Negative.   Neurological:        Losing balance and falling.     Physical Exam No direct physical exam was performed   Vital Signs: LMP  (LMP Unknown) Comment: reports LMP was over a year ago   Imaging: No results found.  Labs:  CBC: Recent Labs    03/23/22 1155 05/28/22 0819  WBC 4.3 4.9  HGB 12.3 12.7  HCT 38.4 40.1  PLT 246 257    COAGS: Recent Labs    03/23/22 1155 05/28/22 0819  INR 0.9 0.9    BMP: Recent Labs    03/23/22 1155 05/28/22 0826 05/29/22 0502  NA 141 140 142  K 3.8 4.2 4.0  CL 107 107 109  CO2 '27 27 26  '$ GLUCOSE 90 98 91  BUN 23* 26* 20  CALCIUM 9.4 9.3 9.1  CREATININE 0.87 0.92 0.86  GFRNONAA >60 >60 >60     Assessment and Plan:  56 year old with tuberous sclerosis and multiple left renal angiomyolipomas.  A dominant left renal angiomyolipoma was treated on 05/28/2022 with particle embolization.  Patient tolerated the procedure well although she did have a fever a couple days after the procedure which could be associated with postembolization syndrome.  Due to patient's solitary left kidney and multiple lesions, we are treating the large lesions individually in order to maintain her renal function.  Reportedly, the patient's renal function is stable following the most recent catheter directed embolization.  We will plan for follow-up MRI of the abdomen, with and without contrast, in December after her vacation to see if the treated dominant lesion has significantly decreased in size.  Assuming that the treated lesion has responded well to embolization, we will direct our attention at the next largest renal lesion probably in early 2024.  Patient is agreeable to this plan.  I am concerned about the patient's recent falling, particularly with renal lesions  that are susceptible to spontaneous hemorrhage.  I will bring this to the attention of the patient's primary care physician and her psychiatrist.   Electronically Signed: Burman Riis 07/21/2022, 3:13 PM   I spent a total of    15 Minutes in remote  clinical consultation, greater than 50% of which was counseling/coordinating care for left renal angiomyolipomas..    Visit type: Audio only (telephone). Audio (no video) only due to patient preference and technical limitations. Alternative for in-person consultation at [facility], [address] [place], [place], [place]. This visit type was conducted due to national recommendations for restrictions regarding the COVID-19 Pandemic (e.g. social distancing).  This format is felt to be most appropriate for this patient at this time.  All issues noted in this document were discussed and addressed.   Patient ID: Melissa Ochoa, female   DOB: 1966/07/18, 56 y.o.   MRN: [id number]

## 2022-07-30 ENCOUNTER — Telehealth (HOSPITAL_COMMUNITY): Payer: Self-pay | Admitting: Psychiatry

## 2022-07-30 ENCOUNTER — Telehealth (HOSPITAL_COMMUNITY): Payer: Medicare Other | Admitting: Psychiatry

## 2022-07-30 NOTE — Telephone Encounter (Signed)
Patient was not present on video platform used through mychart. I called the patient at our scheduled appointment time. There was no answer. I left a voice message for patient to call the clinic back at their convinence. There was no return phone call during out scheduled visit time. I was not able to speak with the patient today, as they were a no show for their scheduled appointment.   

## 2022-08-06 ENCOUNTER — Telehealth (HOSPITAL_BASED_OUTPATIENT_CLINIC_OR_DEPARTMENT_OTHER): Payer: Medicare Other | Admitting: Psychiatry

## 2022-08-06 DIAGNOSIS — F900 Attention-deficit hyperactivity disorder, predominantly inattentive type: Secondary | ICD-10-CM

## 2022-08-06 DIAGNOSIS — F411 Generalized anxiety disorder: Secondary | ICD-10-CM

## 2022-08-06 DIAGNOSIS — F3130 Bipolar disorder, current episode depressed, mild or moderate severity, unspecified: Secondary | ICD-10-CM | POA: Diagnosis not present

## 2022-08-06 MED ORDER — SERTRALINE HCL 100 MG PO TABS: 100.0000 mg | ORAL_TABLET | Freq: Two times a day (BID) | ORAL | 0 refills | Status: DC

## 2022-08-06 MED ORDER — AMPHETAMINE-DEXTROAMPHETAMINE 30 MG PO TABS: 30.0000 mg | ORAL_TABLET | Freq: Two times a day (BID) | ORAL | 0 refills | Status: DC

## 2022-08-06 MED ORDER — ALPRAZOLAM 1 MG PO TABS
1.0000 mg | ORAL_TABLET | Freq: Four times a day (QID) | ORAL | 2 refills | Status: DC | PRN
Start: 2022-08-06 — End: 2022-10-29

## 2022-08-06 MED ORDER — AMPHETAMINE-DEXTROAMPHETAMINE 30 MG PO TABS: 30.0000 mg | ORAL_TABLET | Freq: Two times a day (BID) | ORAL | 0 refills | Status: DC | PRN

## 2022-08-06 MED ORDER — LAMOTRIGINE 100 MG PO TABS: ORAL_TABLET | ORAL | 0 refills | Status: DC

## 2022-08-06 MED ORDER — QUETIAPINE FUMARATE 300 MG PO TABS: 300.0000 mg | ORAL_TABLET | Freq: Every day | ORAL | 0 refills | Status: DC

## 2022-08-06 NOTE — Progress Notes (Signed)
Virtual Visit via Video Note  I connected with Melissa Ochoa on 08/06/22 at  2:45 PM EST by a video enabled telemedicine application and verified that I am speaking with the correct person using two identifiers.  Location: Patient: home Provider: office   I discussed the limitations of evaluation and management by telemedicine and the availability of in person appointments. The patient expressed understanding and agreed to proceed.  History of Present Illness: Melissa Ochoa shares she has been ok. There is a lot going on in her life and the lives of those around her. She is having some problems with her left kidney and has had a few procedures that went well. Melissa Ochoa fell recently and got her bruised up. It was because she wasn't paying attention to where she was walking. She has realized she needs to be more careful. Other than that her health is ok. There are so many things she can't do physically and it makes her depressed. Her daughter is getting help and is doing better. It has motivated Melissa Ochoa to take better care of herself because her daughter needs her more than ever now. Next week she is going to the [facility] and taking a trip for Christmas. She denies SI/HI. At least once/month for a few hours to days she feels "manic". Acire will get frustrated, irritated and isolate. She denies any impulsive behaviors. Her motivation to go out has been low over the last 1-2 weeks. If she doesn't have to leave the house she won't. She is working on decorating the house for the holidays. Overall [name] feels she is going to be ok. She always misses her dad and grandmother at this time of the year. Her ADHD symptoms are manageable with Adderall.    Observations/Objective: Psychiatric Specialty Exam: ROS  There were no vitals taken for this visit.There is no height or weight on file to calculate BMI.  General Appearance: Casual and Neat  Eye Contact:  Good  Speech:  Clear and Coherent and Normal  Rate  Volume:  Normal  Mood:  Anxious and Depressed  Affect:  Congruent  Thought Process:  Coherent and Descriptions of Associations: Circumstantial  Orientation:  Full (Time, Place, and Person)  Thought Content:  Logical  Suicidal Thoughts:  No  Homicidal Thoughts:  No  Memory:  Immediate;   Good  Judgement:  Good  Insight:  Good  Psychomotor Activity:  Normal  Concentration:  Concentration: Good  Recall:  Good  Fund of Knowledge:  Good  Language:  Good  Akathisia:  No  Handed:  Right  AIMS (if indicated):     Assets:  Communication Skills Desire for Improvement Financial Resources/Insurance Housing Intimacy Leisure Time Resilience Social Support Talents/Skills Transportation Vocational/Educational  ADL's:  Intact  Cognition:  WNL  Sleep:        Assessment and Plan:     05/07/2022    1:17 PM 02/05/2022    3:29 PM 01/01/2022   11:07 AM 08/28/2021    1:48 PM 03/13/2021    2:37 PM  Depression screen PHQ 2/9  Decreased Interest 2 0 1 2 0  Down, Depressed, Hopeless '3 1 3 2 1  '$ PHQ - 2 Score '5 1 4 4 1  '$ Altered sleeping 3 0 1 2   Tired, decreased energy '3 2 2 1   '$ Change in appetite '1 2 2 '$ 0   Feeling bad or failure about yourself  '3 2 3 3   '$ Trouble concentrating 1 0 2 0  Moving slowly or fidgety/restless 0 0 0 0   Suicidal thoughts 0 0 0 0   PHQ-9 Score '16 7 14 10   '$ Difficult doing work/chores Extremely dIfficult Somewhat difficult Extremely dIfficult Very difficult     Flowsheet Row Admission (Discharged) from IR 90MIN MCIR1/MCIR2/WLIR1 from 05/28/2022 in [facility] Video Visit from 05/07/2022 in [facility] IR 90MIN MCIR1/MCIR2/WLIR1 from 03/23/2022 in [place] No Risk No Risk No Risk         Pt is aware that these meds carry a teratogenic risk. Pt will discuss plan of action if she does or plans to become pregnant in the future.  Status  of current problems: overall stable  Meds:  1. Bipolar I disorder, most recent episode depressed (HCC) - lamoTRIgine (LAMICTAL) 100 MG tablet; TAKE 1 TABLET(100 MG) BY MOUTH TWICE DAILY  Dispense: 180 tablet; Refill: 0 - QUEtiapine (SEROQUEL) 300 MG tablet; Take 1 tablet (300 mg total) by mouth at bedtime.  Dispense: 90 tablet; Refill: 0 - sertraline (ZOLOFT) 100 MG tablet; Take 1 tablet (100 mg total) by mouth in the morning and at bedtime.  Dispense: 180 tablet; Refill: 0  2. GAD (generalized anxiety disorder) - ALPRAZolam (XANAX) 1 MG tablet; Take 1 tablet (1 mg total) by mouth 4 (four) times daily as needed for anxiety.  Dispense: 120 tablet; Refill: 2 - QUEtiapine (SEROQUEL) 300 MG tablet; Take 1 tablet (300 mg total) by mouth at bedtime.  Dispense: 90 tablet; Refill: 0 - sertraline (ZOLOFT) 100 MG tablet; Take 1 tablet (100 mg total) by mouth in the morning and at bedtime.  Dispense: 180 tablet; Refill: 0  3. Attention deficit hyperactivity disorder (ADHD), predominantly inattentive type - amphetamine-dextroamphetamine (ADDERALL) 30 MG tablet; Take 1 tablet by mouth 2 (two) times daily as needed (attention deficit).  Dispense: 60 tablet; Refill: 0 - amphetamine-dextroamphetamine (ADDERALL) 30 MG tablet; Take 1 tablet by mouth 2 (two) times daily.  Dispense: 60 tablet; Refill: 0 - amphetamine-dextroamphetamine (ADDERALL) 30 MG tablet; Take 1 tablet by mouth 2 (two) times daily.  Dispense: 60 tablet; Refill: 0     Labs: none    Therapy: brief supportive therapy provided. Discussed psychosocial stressors in detail.    Recommended pt stop all drug and alcohol use   Collaboration of Care: Other none  Patient/Guardian was advised Release of Information must be obtained prior to any record release in order to collaborate their care with an outside provider. Patient/Guardian was advised if they have not already done so to contact the registration department to sign all necessary forms in  order for [place] to release information regarding their care.   Consent: Patient/Guardian gives verbal consent for treatment and assignment of benefits for services provided during this visit. Patient/Guardian expressed understanding and agreed to proceed.     Follow Up Instructions: Follow up in 2-3 months or sooner if needed    I discussed the assessment and treatment plan with the patient. The patient was provided an opportunity to ask questions and all were answered. The patient agreed with the plan and demonstrated an understanding of the instructions.   The patient was advised to call back or seek an in-person evaluation if the symptoms worsen or if the condition fails to improve as anticipated.  I provided 20 minutes of non-face-to-face time during this encounter.   Charlcie Cradle, MD

## 2022-08-07 ENCOUNTER — Other Ambulatory Visit: Payer: Self-pay | Admitting: Diagnostic Radiology

## 2022-08-07 DIAGNOSIS — N2889 Other specified disorders of kidney and ureter: Secondary | ICD-10-CM

## 2022-09-10 ENCOUNTER — Other Ambulatory Visit (HOSPITAL_COMMUNITY): Payer: Self-pay | Admitting: Psychiatry

## 2022-09-10 ENCOUNTER — Telehealth (HOSPITAL_COMMUNITY): Payer: Self-pay

## 2022-09-10 MED ORDER — AMPHETAMINE-DEXTROAMPHETAMINE 20 MG PO TABS: 20.0000 mg | ORAL_TABLET | Freq: Two times a day (BID) | ORAL | 0 refills | Status: DC

## 2022-09-10 NOTE — Telephone Encounter (Signed)
Patient called to report that her pharmacy is out of the adderall 30 mg they do have '20mg'$  available please advise      Disp Refills Start End   amphetamine-dextroamphetamine (ADDERALL) 30 MG tablet 60 tablet 0 08/06/2022 08/06/2023   Sig - Route: Take 1 tablet by mouth 2 (two) times daily as needed (attention deficit). - Oral   Sent to pharmacy as: amphetamine-dextroamphetamine (ADDERALL) 30 MG tablet   Earliest Fill Date: 08/06/2022   Notes to Pharmacy: Fill after 28 days   E-Prescribing Status: Receipt confirmed by pharmacy (08/06/2022  3:10 PM EST)

## 2022-09-24 DIAGNOSIS — L578 Other skin changes due to chronic exposure to nonionizing radiation: Secondary | ICD-10-CM | POA: Diagnosis not present

## 2022-09-24 DIAGNOSIS — D1801 Hemangioma of skin and subcutaneous tissue: Secondary | ICD-10-CM | POA: Diagnosis not present

## 2022-09-24 DIAGNOSIS — D2339 Other benign neoplasm of skin of other parts of face: Secondary | ICD-10-CM | POA: Diagnosis not present

## 2022-09-24 DIAGNOSIS — L820 Inflamed seborrheic keratosis: Secondary | ICD-10-CM | POA: Diagnosis not present

## 2022-10-08 ENCOUNTER — Other Ambulatory Visit (HOSPITAL_COMMUNITY): Payer: Self-pay | Admitting: Psychiatry

## 2022-10-08 ENCOUNTER — Telehealth (HOSPITAL_COMMUNITY): Payer: Self-pay

## 2022-10-08 MED ORDER — AMPHETAMINE-DEXTROAMPHETAMINE 20 MG PO TABS: 20.0000 mg | ORAL_TABLET | Freq: Two times a day (BID) | ORAL | 0 refills | Status: DC

## 2022-10-08 NOTE — Telephone Encounter (Signed)
Patient called requesting a refill for the following medication she found medication at the following pharmacy  Disp Refills Start End   amphetamine-dextroamphetamine (ADDERALL) 20 MG tablet 60 tablet 0 09/10/2022 09/10/2023   Sig - Route: Take 1 tablet (20 mg total) by mouth 2 (two) times daily. - Oral   Sent to pharmacy as: amphetamine-dextroamphetamine (ADDERALL) 20 MG tablet   Earliest Fill Date: 09/10/2022   Notes to Pharmacy: Decreased dose 2nd to Adderall availability   E-Prescribing Status: Receipt confirmed by pharmacy (09/10/2022  5:06 PM EST)    Preferred pharmacy [facility], [place] Phone: [phone]  Fax: [phone]

## 2022-10-09 ENCOUNTER — Telehealth (HOSPITAL_COMMUNITY): Payer: Self-pay | Admitting: *Deleted

## 2022-10-09 NOTE — Telephone Encounter (Signed)
Pt called requesting a refill of the Adderall 30 mg BID, as was previously prescribed. Pt also mentioned that a Rx for Adderall 10 mg  bid be added to last script of Adderall 20 mg for total dose of 30 mg bid. Last visit was on 08/06/22 and pt has an upcoming appointment scheduled for 10/29/22. Please review.

## 2022-10-13 ENCOUNTER — Telehealth (HOSPITAL_COMMUNITY): Payer: Self-pay | Admitting: *Deleted

## 2022-10-13 NOTE — Telephone Encounter (Signed)
Pt calling to request the Adderral IR 30 mg BID be refilled or authorized due to last script sent for 20 mg BID due to shortage of the 30 mg. Pt next scheduled appointment scheduled for 10/29/22. Please review.

## 2022-10-14 ENCOUNTER — Telehealth (HOSPITAL_COMMUNITY): Payer: Self-pay | Admitting: *Deleted

## 2022-10-14 NOTE — Telephone Encounter (Signed)
Pt called requesting refill of the Adderral 30 mg BID. Pt has filled the last script for Adderall 20 mg BId on 10/08/22 ,per pt, but says that it's not working and also asked if giving an extra #90 to "make up the difference" is doable. Pt next appointment scheduled for 10/29/22. Please review.

## 2022-10-15 ENCOUNTER — Other Ambulatory Visit (HOSPITAL_COMMUNITY): Payer: Self-pay | Admitting: Psychiatry

## 2022-10-15 DIAGNOSIS — F900 Attention-deficit hyperactivity disorder, predominantly inattentive type: Secondary | ICD-10-CM

## 2022-10-15 MED ORDER — AMPHETAMINE-DEXTROAMPHETAMINE 30 MG PO TABS: 30.0000 mg | ORAL_TABLET | Freq: Two times a day (BID) | ORAL | 0 refills | Status: DC | PRN

## 2022-10-15 NOTE — Telephone Encounter (Signed)
Thank you. I will notify pt.

## 2022-10-20 ENCOUNTER — Other Ambulatory Visit (HOSPITAL_COMMUNITY): Payer: Self-pay | Admitting: Psychiatry

## 2022-10-20 DIAGNOSIS — F411 Generalized anxiety disorder: Secondary | ICD-10-CM

## 2022-10-28 ENCOUNTER — Other Ambulatory Visit (HOSPITAL_COMMUNITY): Payer: Self-pay | Admitting: Psychiatry

## 2022-10-28 DIAGNOSIS — F411 Generalized anxiety disorder: Secondary | ICD-10-CM

## 2022-10-28 DIAGNOSIS — F3130 Bipolar disorder, current episode depressed, mild or moderate severity, unspecified: Secondary | ICD-10-CM

## 2022-10-29 ENCOUNTER — Telehealth (HOSPITAL_BASED_OUTPATIENT_CLINIC_OR_DEPARTMENT_OTHER): Payer: Medicare Other | Admitting: Psychiatry

## 2022-10-29 DIAGNOSIS — F411 Generalized anxiety disorder: Secondary | ICD-10-CM

## 2022-10-29 DIAGNOSIS — F3130 Bipolar disorder, current episode depressed, mild or moderate severity, unspecified: Secondary | ICD-10-CM

## 2022-10-29 DIAGNOSIS — F900 Attention-deficit hyperactivity disorder, predominantly inattentive type: Secondary | ICD-10-CM | POA: Diagnosis not present

## 2022-10-29 MED ORDER — SERTRALINE HCL 100 MG PO TABS: 100.0000 mg | ORAL_TABLET | Freq: Two times a day (BID) | ORAL | 0 refills | Status: DC

## 2022-10-29 MED ORDER — AMPHETAMINE-DEXTROAMPHETAMINE 20 MG PO TABS: 60.0000 mg | ORAL_TABLET | Freq: Every day | ORAL | 0 refills | Status: DC

## 2022-10-29 MED ORDER — LAMOTRIGINE 100 MG PO TABS: ORAL_TABLET | ORAL | 0 refills | Status: DC

## 2022-10-29 MED ORDER — ALPRAZOLAM 1 MG PO TABS
1.0000 mg | ORAL_TABLET | Freq: Four times a day (QID) | ORAL | 2 refills | Status: DC | PRN
Start: 2022-10-29 — End: 2023-01-28

## 2022-10-29 MED ORDER — QUETIAPINE FUMARATE 50 MG PO TABS: 50.0000 mg | ORAL_TABLET | Freq: Every day | ORAL | 0 refills | Status: DC

## 2022-10-29 MED ORDER — QUETIAPINE FUMARATE 300 MG PO TABS
300.0000 mg | ORAL_TABLET | Freq: Every day | ORAL | 0 refills | Status: DC
Start: 2022-10-29 — End: 2023-01-28

## 2022-10-29 NOTE — Progress Notes (Signed)
Virtual Visit via Video Note  I connected with Melissa Ochoa on 10/29/22 at  2:00 PM EST by  a video enabled telemedicine application and verified that I am speaking with the correct person using two identifiers.  Location: Patient: home Provider: office   I discussed the limitations of evaluation and management by telemedicine and the availability of in person appointments. The patient expressed understanding and agreed to proceed.  History of Present Illness: Melissa Ochoa shares that she has had a really rough month. Her daughter has double pneumonia and was in the hospital for 2 weeks. Her daughter is home now and it has been hard taking care of her. She does not know where she got the energy to run around. [facility] was out of Adderall and Seroquel. She went a couple of days without Seroquel but she is not sure because she has been so stressed. She had to take '40mg'$  of Adderall and states it was not not as effective. She was having trouble with focus. Little things have been irritating her. Melissa Ochoa shares that her anxiety and depression have been high due to stress. At times she has a sense of worthlessness and hopelessness when her daughter got really sick. She felt like she let her daughter down when she got really, really sick. Currently Melissa Ochoa shares that her anxiety is a little better but she is still very worried about her full recovery. Melissa Ochoa has had decent sleep most nights this past week. She is eating fairly. She denies SI/HI. Melissa Ochoa thinks she had a little manic like symptoms where she had more energy and more irritability than usual. It lasted for a about 1 day. She shares impulsivity, financial extravagance occurred thru out the month of December. She thinks she spent about $4000. She is not happy about the financial extravagance. She still feels somewhat impulsive.     Observations/Objective: Psychiatric Specialty Exam: ROS  There were no vitals taken for this visit.There is no  height or weight on file to calculate BMI.  General Appearance: Casual and Fairly Groomed  Eye Contact:  Good  Speech:  Clear and Coherent and Normal Rate  Volume:  Normal  Mood:  Anxious and Depressed  Affect:  Congruent  Thought Process:  Coherent and Descriptions of Associations: Circumstantial  Orientation:  Full (Time, Place, and Person)  Thought Content:  Logical  Suicidal Thoughts:  No  Homicidal Thoughts:  No  Memory:  Immediate;   Good  Judgement:  Good  Insight:  Good  Psychomotor Activity:  Normal  Concentration:  Concentration: Fair  Recall:  Good  Fund of Knowledge:  Good  Language:  Good  Akathisia:  No  Handed:  Right  AIMS (if indicated):     Assets:  Communication Skills Desire for Improvement Financial Resources/Insurance Housing Intimacy Leisure Time Resilience Social Support Talents/Skills Transportation Vocational/Educational  ADL's:  Intact  Cognition:  WNL  Sleep:        Assessment and Plan:     05/07/2022    1:17 PM 02/05/2022    3:29 PM 01/01/2022   11:07 AM 08/28/2021    1:48 PM 03/13/2021    2:37 PM  Depression screen PHQ 2/9  Decreased Interest 2 0 1 2 0  Down, Depressed, Hopeless '3 1 3 2 1  '$ PHQ - 2 Score '5 1 4 4 1  '$ Altered sleeping 3 0 1 2   Tired, decreased energy '3 2 2 1   '$ Change in appetite '1 2 2 '$ 0   Feeling  bad or failure about yourself  '3 2 3 3   '$ Trouble concentrating 1 0 2 0   Moving slowly or fidgety/restless 0 0 0 0   Suicidal thoughts 0 0 0 0   PHQ-9 Score '16 7 14 10   '$ Difficult doing work/chores Extremely dIfficult Somewhat difficult Extremely dIfficult Very difficult     Flowsheet Row Admission (Discharged) from IR 90MIN MCIR1/MCIR2/WLIR1 from 05/28/2022 in [facility] Video Visit from 05/07/2022 in [facility] IR 90MIN [place] from 03/23/2022 in [facility] No Risk No Risk No Risk           Pt is aware that these meds carry a teratogenic risk. Pt will discuss plan of action if she does or plans to become pregnant in the future.  Status of current problems: anxiety and depression related to situational stressors, ongoing ADHD symptoms.    Medication management with supportive therapy. Risks and benefits, side effects and alternative treatment options discussed with patient. Pt was given an opportunity to ask questions about medication, illness, and treatment. All current psychiatric medications have been reviewed and discussed with the patient and adjusted as clinically appropriate.  Pt verbalized understanding and verbal consent obtained for treatment.  Meds: increase Seroquel to '350mg'$  po qHS for Bipolar disorder.  Increase Adderall to '60mg'$  po qD for ADHD 1. GAD (generalized anxiety disorder) - ALPRAZolam (XANAX) 1 MG tablet; Take 1 tablet (1 mg total) by mouth 4 (four) times daily as needed for anxiety.  Dispense: 120 tablet; Refill: 2 - sertraline (ZOLOFT) 100 MG tablet; Take 1 tablet (100 mg total) by mouth in the morning and at bedtime.  Dispense: 180 tablet; Refill: 0 - QUEtiapine (SEROQUEL) 300 MG tablet; Take 1 tablet (300 mg total) by mouth at bedtime.  Dispense: 90 tablet; Refill: 0  2. Bipolar I disorder, most recent episode depressed (HCC) - lamoTRIgine (LAMICTAL) 100 MG tablet; TAKE 1 TABLET(100 MG) BY MOUTH TWICE DAILY  Dispense: 180 tablet; Refill: 0 - sertraline (ZOLOFT) 100 MG tablet; Take 1 tablet (100 mg total) by mouth in the morning and at bedtime.  Dispense: 180 tablet; Refill: 0 - QUEtiapine (SEROQUEL) 300 MG tablet; Take 1 tablet (300 mg total) by mouth at bedtime.  Dispense: 90 tablet; Refill: 0  3. Attention deficit hyperactivity disorder (ADHD), predominantly inattentive type - amphetamine-dextroamphetamine (ADDERALL) 20 MG tablet; Take 3 tablets (60 mg total) by mouth daily.  Dispense: 90 tablet; Refill: 0 - amphetamine-dextroamphetamine  (ADDERALL) 20 MG tablet; Take 3 tablets (60 mg total) by mouth daily.  Dispense: 90 tablet; Refill: 0     Labs:  05/29/22 BMP WNL, reminded to get EKG and labs done ASAP   Therapy: brief supportive therapy provided. Discussed psychosocial stressors in detail.     Collaboration of Care: Other none today  Patient/Guardian was advised Release of Information must be obtained prior to any record release in order to collaborate their care with an outside provider. Patient/Guardian was advised if they have not already done so to contact the registration department to sign all necessary forms in order for [place] to release information regarding their care.   Consent: Patient/Guardian gives verbal consent for treatment and assignment of benefits for services provided during this visit. Patient/Guardian expressed understanding and agreed to proceed.     Follow Up Instructions: Follow up in 2-3 months or sooner if needed    I discussed the assessment and treatment plan with the  patient. The patient was provided an opportunity to ask questions and all were answered. The patient agreed with the plan and demonstrated an understanding of the instructions.   The patient was advised to call back or seek an in-person evaluation if the symptoms worsen or if the condition fails to improve as anticipated.  I provided 27 minutes of non-face-to-face time during this encounter.   Charlcie Cradle, MD

## 2022-11-11 DIAGNOSIS — N182 Chronic kidney disease, stage 2 (mild): Secondary | ICD-10-CM | POA: Diagnosis not present

## 2022-11-11 DIAGNOSIS — Z79899 Other long term (current) drug therapy: Secondary | ICD-10-CM | POA: Diagnosis not present

## 2022-11-12 LAB — LIPID PANEL
Chol/HDL Ratio: 3.8 ratio (ref 0.0–4.4)
Cholesterol, Total: 280 mg/dL — ABNORMAL HIGH (ref 100–199)
HDL: 74 mg/dL (ref >39)
LDL Chol Calc (NIH): 184 mg/dL — ABNORMAL HIGH (ref 0–99)
Triglycerides: 123 mg/dL (ref 0–149)
VLDL Cholesterol Cal: 22 mg/dL (ref 5–40)

## 2022-11-12 LAB — COMPREHENSIVE METABOLIC PANEL
ALT: 14 IU/L (ref 0–32)
AST: 21 IU/L (ref 0–40)
Albumin/Globulin Ratio: 2.9 — ABNORMAL HIGH (ref 1.2–2.2)
Albumin: 5 g/dL — ABNORMAL HIGH (ref 3.8–4.9)
Alkaline Phosphatase: 91 IU/L (ref 44–121)
BUN/Creatinine Ratio: 19 (ref 9–23)
BUN: 20 mg/dL (ref 6–24)
Bilirubin Total: <0.2 mg/dL (ref 0.0–1.2)
CO2: 26 mmol/L (ref 20–29)
Calcium: 9.7 mg/dL (ref 8.7–10.2)
Chloride: 99 mmol/L (ref 96–106)
Creatinine, Ser: 1.03 mg/dL — ABNORMAL HIGH (ref 0.57–1.00)
Globulin, Total: 1.7 g/dL (ref 1.5–4.5)
Glucose: 79 mg/dL (ref 70–99)
Potassium: 4.5 mmol/L (ref 3.5–5.2)
Sodium: 140 mmol/L (ref 134–144)
Total Protein: 6.7 g/dL (ref 6.0–8.5)
eGFR: 63 mL/min/{1.73_m2} (ref >59)

## 2022-11-12 LAB — CBC
Hematocrit: 37.7 % (ref 34.0–46.6)
Hemoglobin: 12.2 g/dL (ref 11.1–15.9)
MCH: 28.5 pg (ref 26.6–33.0)
MCHC: 32.4 g/dL (ref 31.5–35.7)
MCV: 88 fL (ref 79–97)
Platelets: 248 10*3/uL (ref 150–450)
RBC: 4.28 x10E6/uL (ref 3.77–5.28)
RDW: 14.6 % (ref 11.7–15.4)
WBC: 5.3 10*3/uL (ref 3.4–10.8)

## 2022-11-12 LAB — TSH: TSH: 2.52 u[IU]/mL (ref 0.450–4.500)

## 2022-11-12 LAB — HEMOGLOBIN A1C
Est. average glucose Bld gHb Est-mCnc: 120 mg/dL
Hgb A1c MFr Bld: 5.8 % — ABNORMAL HIGH (ref 4.8–5.6)

## 2022-11-12 LAB — PROLACTIN: Prolactin: 9.7 ng/mL (ref 3.6–25.2)

## 2022-11-19 ENCOUNTER — Ambulatory Visit (HOSPITAL_COMMUNITY)
Admission: RE | Admit: 2022-11-19 | Discharge: 2022-11-19 | Disposition: A | Discharge status: Home / Self Care | Payer: Medicare Other | Source: Ambulatory Visit | Attending: Diagnostic Radiology | Admitting: Diagnostic Radiology

## 2022-11-19 DIAGNOSIS — K7689 Other specified diseases of liver: Secondary | ICD-10-CM | POA: Diagnosis not present

## 2022-11-19 DIAGNOSIS — Z86018 Personal history of other benign neoplasm: Secondary | ICD-10-CM | POA: Diagnosis not present

## 2022-11-19 DIAGNOSIS — Q851 Tuberous sclerosis: Secondary | ICD-10-CM | POA: Diagnosis not present

## 2022-11-19 DIAGNOSIS — N2889 Other specified disorders of kidney and ureter: Secondary | ICD-10-CM | POA: Diagnosis not present

## 2022-11-19 MED ORDER — GADOBUTROL 1 MMOL/ML IV SOLN
10.0000 mL | Freq: Once | INTRAVENOUS | Status: AC | PRN
  Administered 2022-11-19: 10 mL via INTRAVENOUS

## 2022-12-02 ENCOUNTER — Ambulatory Visit
Admission: RE | Admit: 2022-12-02 | Discharge: 2022-12-02 | Disposition: A | Discharge status: Home / Self Care | Payer: Medicare Other | Source: Ambulatory Visit | Attending: Diagnostic Radiology | Admitting: Diagnostic Radiology

## 2022-12-02 DIAGNOSIS — D1771 Benign lipomatous neoplasm of kidney: Secondary | ICD-10-CM | POA: Diagnosis not present

## 2022-12-02 DIAGNOSIS — N2889 Other specified disorders of kidney and ureter: Secondary | ICD-10-CM

## 2022-12-02 DIAGNOSIS — Z905 Acquired absence of kidney: Secondary | ICD-10-CM | POA: Diagnosis not present

## 2022-12-02 NOTE — Progress Notes (Signed)
Chief Complaint: Patient was consulted remotely today (TeleHealth) for follow-up of left renal angiomyolipomas.  Referring Physician(s): Donato Heinz, MD Jacalyn Lefevre, MD  History of Present Illness: Melissa Ochoa is a 57 y.o. female with tuberous sclerosis and history of bilateral renal angiomyolipomas.  History of spontaneous renal hemorrhages and the patient had a right nephrectomy in 2011.  Patient also had previous embolizations to the left kidney.  Most recently, the patient had particle embolization to her dominant angiomyolipoma in the left kidney upper pole on 05/28/2022.  She tolerated the embolization procedure well and her kidney function remains normal.  Patient recently had a follow-up MRI on 11/19/2022.  She has no significant complaints at this time.  She notes occasional left flank discomfort or pain which she attributes to the multiple renal lesions.  She denies hematuria or dysuria.  She notes that her daughter has been recently ill and required multiple days in the hospital for treatment of pneumonia.  She continues to have episodes of diarrhea.She had many questions about her MRI results.  Past Medical History:  Diagnosis Date   ADHD (attention deficit hyperactivity disorder)    Anxiety    Bipolar disorder (HCC)    Cervical disc disease    Colon polyps    adenomatous   Depression    Hypertension    Iron deficiency    Kidney disease    renal angiomyolipomas   Liver cyst    Melanosis    Pneumonia    Tuberous sclerosis ([place])     Past Surgical History:  Procedure Laterality Date   COLONOSCOPY W/ ENDOSCOPIC [place]     IR EMBO TUMOR ORGAN ISCHEMIA INFARCT INC GUIDE ROADMAPPING  05/28/2022   IR RADIOLOGIST EVAL & MGMT  02/24/2022   IR RADIOLOGIST EVAL & MGMT  07/21/2022   IR RENAL SUPRASEL UNI S&I MOD SED  03/23/2022   IR RENAL SUPRASEL UNI S&I MOD SED  05/28/2022   IR US GUIDE VASC ACCESS RIGHT  03/23/2022   IR US GUIDE VASC ACCESS RIGHT  05/28/2022    KNEE ARTHROSCOPY WITH DRILLING/MICROFRACTURE Right 09/27/2018   Procedure: Right knee arthroscopy with partial lateral menisectomy;  Surgeon: Latanya Maudlin, MD;  Location: [facility];  Service: Orthopedics;  Laterality: Right;   NEPHRECTOMY Right    RENAL ARTERY EMBOLIZATION     2 procdures in the left , 4 in the right     TOTAL KNEE ARTHROPLASTY Right 03/27/2019   Procedure: TOTAL KNEE ARTHROPLASTY;  Surgeon: Gaynelle Arabian, MD;  Location: [facility];  Service: Orthopedics;  Laterality: Right;  32mn    Allergies: Patient has no known allergies.  Medications: Prior to Admission medications   Medication Sig Start Date End Date Taking? Authorizing Provider  ALPRAZolam (Duanne Moron 1 MG tablet Take 1 tablet (1 mg total) by mouth 4 (four) times daily as needed for anxiety. 10/29/22   ACharlcie Cradle MD  amphetamine-dextroamphetamine (ADDERALL) 20 MG tablet Take 3 tablets (60 mg total) by mouth daily. 10/29/22 10/29/23  ACharlcie Cradle MD  amphetamine-dextroamphetamine (ADDERALL) 20 MG tablet Take 3 tablets (60 mg total) by mouth daily. 10/29/22 10/29/23  ACharlcie Cradle MD  amphetamine-dextroamphetamine (ADDERALL) 20 MG tablet Take 3 tablets (60 mg total) by mouth daily. 10/29/22 10/29/23  ACharlcie Cradle MD  amphetamine-dextroamphetamine (ADDERALL) 30 MG tablet Take 1 tablet by mouth 2 (two) times daily. Patient not taking: Reported on 10/29/2022 08/06/22 08/06/23  ACharlcie Cradle MD  amphetamine-dextroamphetamine (ADDERALL) 30 MG tablet Take 1 tablet by mouth 2 (two)  times daily. Patient not taking: Reported on 10/29/2022 08/06/22 08/06/23  Charlcie Cradle, MD  amphetamine-dextroamphetamine (ADDERALL) 30 MG tablet Take 1 tablet by mouth 2 (two) times daily as needed (attention deficit). Patient not taking: Reported on 10/29/2022 10/15/22 10/15/23  Charlcie Cradle, MD  cyclobenzaprine (FLEXERIL) 10 MG tablet Take 10 mg by mouth every 8 (eight) hours as needed for muscle spasms. Patient not taking: Reported on 10/29/2022  05/21/22   [provider]  docusate sodium (COLACE) 100 MG capsule Take 1 capsule (100 mg total) by mouth 2 (two) times daily. Patient not taking: Reported on 08/06/2022 05/29/22   Tyson Alias, NP  labetalol (NORMODYNE) 300 MG tablet Take 600 mg by mouth 3 (three) times daily. 04/05/22   [provider]  lamoTRIgine (LAMICTAL) 100 MG tablet TAKE 1 TABLET(100 MG) BY MOUTH TWICE DAILY 10/29/22   Charlcie Cradle, MD  losartan (COZAAR) 50 MG tablet Take 50 mg by mouth daily.     [provider]  promethazine (PHENERGAN) 12.5 MG tablet Take 1 tablet (12.5 mg total) by mouth every 6 (six) hours as needed for nausea or vomiting. Patient not taking: Reported on 10/29/2022 03/23/22   Tyson Alias, NP  promethazine (PHENERGAN) 25 MG tablet Take 0.5 tablets (12.5 mg total) by mouth every 8 (eight) hours as needed for nausea. Patient not taking: Reported on 10/29/2022 05/29/22   Tyson Alias, NP  QUEtiapine (SEROQUEL) 300 MG tablet Take 1 tablet (300 mg total) by mouth at bedtime. 10/29/22   Charlcie Cradle, MD  QUEtiapine (SEROQUEL) 50 MG tablet Take 1 tablet (50 mg total) by mouth daily. 10/29/22 10/29/23  Charlcie Cradle, MD  sertraline (ZOLOFT) 100 MG tablet Take 1 tablet (100 mg total) by mouth in the morning and at bedtime. 10/29/22   Charlcie Cradle, MD     Family History  Problem Relation Age of Onset   Anxiety disorder Mother    ADD / ADHD Mother    Depression Mother    Lung cancer Father        smoker   Bipolar disorder Daughter    Bipolar disorder Other    Diabetes Other        a lot of relatives   Suicidality Neg Hx     Social History   Socioeconomic History   Marital status: Married    Spouse name: Not on file   Number of children: Not on file   Years of education: Not on file   Highest education level: Not on file  Occupational History   Not on file  Tobacco Use   Smoking status: Never   Smokeless tobacco: Never  Vaping Use   Vaping Use: Never used   Substance and Sexual Activity   Alcohol use: No    Alcohol/week: 0.0 standard drinks of alcohol   Drug use: No   Sexual activity: Yes    Partners: Male    Birth control/protection: None  Other Topics Concern   Not on file  Social History Narrative   Not on file   Social Determinants of Health   Financial Resource Strain: Not on file  Food Insecurity: Not on file  Transportation Needs: Not on file  Physical Activity: Not on file  Stress: Not on file  Social Connections: Not on file    ECOG Status: 1 - Symptomatic but completely ambulatory  Review of Systems  Constitutional:  Positive for fatigue.  Respiratory: Negative.    Gastrointestinal:  Positive for diarrhea.  Genitourinary:  Positive for flank pain.    Physical Exam  Vital Signs: LMP  (LMP Unknown) Comment: reports LMP was over a year ago   Imaging: MR ABDOMEN WWO CONTRAST  Result Date: 11/19/2022 CLINICAL DATA:  Tuberous sclerosis and multiple renal angiomyolipomas. Follow-up left renal mass after embolization. EXAM: MRI ABDOMEN WITHOUT AND WITH CONTRAST TECHNIQUE: Multiplanar multisequence MR imaging of the abdomen was performed both before and after the administration of intravenous contrast. CONTRAST:  54m GADAVIST GADOBUTROL 1 MMOL/ML IV SOLN COMPARISON:  01/30/2022 FINDINGS: Lower chest: No acute findings. Hepatobiliary: No hepatic masses identified. A few tiny less than 5 mm cysts again seen in the peripheral right hepatic lobe. Gallbladder is unremarkable. No evidence of biliary ductal dilatation. Pancreas:  No mass or inflammatory changes. Spleen:  Within normal limits in size and appearance. Adrenals/Urinary Tract: Prior right nephrectomy again noted. Multiple heterogeneously enhancing masses are seen throughout the left kidney. These masses are unchanged since previous study, except for 1 mass in the lateral upper pole of the left kidney which shows significant decrease in size. This currently measures 3.9 x  2.3 cm on image 31/20, compared to 6.3 x 4.9 cm previously. No new or enlarging masses are identified. No evidence of acute hemorrhage or hydronephrosis. Stomach/Bowel: Unremarkable. Vascular/Lymphatic: No pathologically enlarged lymph nodes identified. No acute vascular findings. Other:  Stable tiny umbilical hernia, which contains only fat. Musculoskeletal:  No suspicious bone lesions identified. IMPRESSION: Significant decrease in size of 1 renal mass in the lateral upper pole of the left kidney. Multiple other left renal masses show no significant change. No evidence of hemorrhage or other acute findings. Electronically Signed   By: JMarlaine HindM.D.   On: 11/19/2022 13:46    Labs:  CBC: Recent Labs    03/23/22 1155 05/28/22 0819 11/11/22 1559  WBC 4.3 4.9 5.3  HGB 12.3 12.7 12.2  HCT 38.4 40.1 37.7  PLT 246 257 248    COAGS: Recent Labs    03/23/22 1155 05/28/22 0819  INR 0.9 0.9    BMP: Recent Labs    03/23/22 1155 05/28/22 0826 05/29/22 0502 11/11/22 1559  NA 141 140 142 140  K 3.8 4.2 4.0 4.5  CL 107 107 109 99  CO2 '27 27 26 26  '$ GLUCOSE 90 98 91 79  BUN 23* 26* 20 20  CALCIUM 9.4 9.3 9.1 9.7  CREATININE 0.87 0.92 0.86 1.03*  GFRNONAA >60 >60 >60  --     LIVER FUNCTION TESTS: Recent Labs    11/11/22 1559  BILITOT <0.2  AST 21  ALT 14  ALKPHOS 91  PROT 6.7  ALBUMIN 5.0*    TUMOR MARKERS: No results for input(s): "AFPTM", "CEA", "CA199", "CHROMGRNA" in the last 8760 hours.  Assessment and Plan:  57year old with tuberous sclerosis and status post right nephrectomy due to renal hemorrhages from multiple renal angiomyolipomas.  Patient has a solitary left kidney with multiple left renal angiomyolipomas.  The largest cluster of tumors in the lateral left kidney upper pole were treated with particle embolization on 05/28/2022.  Recent MRI demonstrates successful infarction of the lesion in this area.  Patient continues to have multiple angiomyolipomas that  measure greater than 3 cm.  There continues to be a dominant exophytic lesion in the lateral left kidney interpolar region that measures up to 4.9 cm but minimally changed.  Overall, the angiomyolipomas have not significantly changed since 01/30/2022.  Patient had many questions about the MRI results. The expectation for the embolization was  for the treated lesions to infarct and decrease in size.  I am very happy with the results of the particle embolization treatment.  I am also very happy that the patient's renal function remains normal and did not change following the embolization.  In the past, we discussed treating 1 or 2 dominant lesions at a time in order to decrease the risk of damaging normal renal tissue.  Explained to the patient that the angiomyolipomas are minimally changed in 9 months but she still has multiple lesions that are at risk for hemorrhage.  In particular, I am worried about the exophytic lesion along the lateral left kidney interpolar region that measures close to 5 cm.  I recommend treating this lesion with particle embolization.  Patient is reluctant to undergo another procedure at this time.  She has a lot of anxiety about going back into the hospital.  I think we could perform her embolization as an outpatient based on our previous treatment experience. I told the patient that my recommendation is to undergo another embolization and she will think about it and contact us in the near future.  We will plan to contact the patient in 2 to 3 months if we do not hear from her sooner.   Thank you for this interesting consult.  I greatly enjoyed meeting Melissa Ochoa and look forward to participating in their care.  A copy of this report was sent to the requesting provider on this date.  Electronically Signed: Burman Riis 12/02/2022, 2:56 PM   I spent a total of  25 Minutes in remote  clinical consultation, greater than 50% of which was counseling/coordinating care for management of  multiple renal angiomyolipomas.   Total time includes review of chart and imaging.     Visit type: Audio only (telephone). Audio (no video) only due to patient preference and technical limitations. Alternative for in-person consultation at [facility], [address] [place], [place], [place]. Patient ID: Melissa Ochoa, female   DOB: July 20, 1966, 57 y.o.   MRN: [id number]

## 2023-01-02 ENCOUNTER — Other Ambulatory Visit (HOSPITAL_COMMUNITY): Payer: Self-pay | Admitting: Psychiatry

## 2023-01-21 ENCOUNTER — Telehealth (HOSPITAL_COMMUNITY): Payer: Self-pay | Admitting: Psychiatry

## 2023-01-21 ENCOUNTER — Telehealth (HOSPITAL_COMMUNITY): Payer: Medicare Other | Admitting: Psychiatry

## 2023-01-21 NOTE — Telephone Encounter (Signed)
Patient was not present on video platform used through mychart. I called the patient at our scheduled appointment time. There was no answer. I left a voice message for patient to call the clinic back at their convinence. There was no return phone call during out scheduled visit time. I was not able to speak with the patient today, as they were a no show for their scheduled appointment.   

## 2023-01-22 ENCOUNTER — Telehealth (HOSPITAL_COMMUNITY): Payer: Self-pay | Admitting: *Deleted

## 2023-01-22 ENCOUNTER — Other Ambulatory Visit (HOSPITAL_COMMUNITY): Payer: Self-pay | Admitting: Psychiatry

## 2023-01-22 DIAGNOSIS — F411 Generalized anxiety disorder: Secondary | ICD-10-CM

## 2023-01-22 NOTE — Telephone Encounter (Signed)
Pt called requesting a refill of the Amphetamine-Dextroamphetamine 20 mg tabs #90 (60 mg total dose). Pt has script on file at the [facility] on [address] but they are out of stock. Pt is requesting script be sent to the [facility] on [address] Pharmacy is in profile. As they have enough in stock she says. Pt missed appointment yesterday. Last completed appointment was on 10/29/22. Pt has an appointment scheduled for 01/28/23. Please review.

## 2023-01-26 ENCOUNTER — Telehealth (HOSPITAL_COMMUNITY): Payer: Self-pay | Admitting: *Deleted

## 2023-01-26 NOTE — Telephone Encounter (Signed)
Pt LVM requesting early refill of the Xanax and Adderall as she is afraid by fill date the pharmacy will be out of stock on these meds. Pt has an appointment scheduled on 01/28/23. Please review.

## 2023-01-28 ENCOUNTER — Other Ambulatory Visit (HOSPITAL_COMMUNITY): Payer: Self-pay

## 2023-01-28 ENCOUNTER — Telehealth (HOSPITAL_BASED_OUTPATIENT_CLINIC_OR_DEPARTMENT_OTHER): Payer: Medicare Other | Admitting: Psychiatry

## 2023-01-28 ENCOUNTER — Other Ambulatory Visit (HOSPITAL_BASED_OUTPATIENT_CLINIC_OR_DEPARTMENT_OTHER): Payer: Self-pay

## 2023-01-28 DIAGNOSIS — F3130 Bipolar disorder, current episode depressed, mild or moderate severity, unspecified: Secondary | ICD-10-CM | POA: Diagnosis not present

## 2023-01-28 DIAGNOSIS — F900 Attention-deficit hyperactivity disorder, predominantly inattentive type: Secondary | ICD-10-CM | POA: Diagnosis not present

## 2023-01-28 DIAGNOSIS — F411 Generalized anxiety disorder: Secondary | ICD-10-CM | POA: Diagnosis not present

## 2023-01-28 MED ORDER — AMPHETAMINE-DEXTROAMPHETAMINE 30 MG PO TABS
30.0000 mg | ORAL_TABLET | Freq: Two times a day (BID) | ORAL | 0 refills | Status: DC | PRN
  Filled 2023-01-28: qty 60, 30d supply, fill #0

## 2023-01-28 MED ORDER — ALPRAZOLAM 1 MG PO TABS: 1.0000 mg | ORAL_TABLET | Freq: Four times a day (QID) | ORAL | 2 refills | Status: DC | PRN

## 2023-01-28 MED ORDER — AMPHETAMINE-DEXTROAMPHETAMINE 30 MG PO TABS
30.0000 mg | ORAL_TABLET | Freq: Two times a day (BID) | ORAL | 0 refills | Status: DC | PRN
Start: 2023-01-28 — End: 2023-03-25
  Filled 2023-01-28: qty 60, 30d supply, fill #0

## 2023-01-28 MED ORDER — AMPHETAMINE-DEXTROAMPHETAMINE 30 MG PO TABS
30.0000 mg | ORAL_TABLET | Freq: Two times a day (BID) | ORAL | 0 refills | Status: DC
Start: 2023-01-28 — End: 2023-03-25
  Filled 2023-01-28 – 2023-03-24 (×5): qty 60, 30d supply, fill #0

## 2023-01-28 MED ORDER — AMPHETAMINE-DEXTROAMPHETAMINE 30 MG PO TABS
30.0000 mg | ORAL_TABLET | Freq: Two times a day (BID) | ORAL | 0 refills | Status: DC
  Filled 2023-01-28: qty 60, 30d supply, fill #0

## 2023-01-28 MED ORDER — LAMOTRIGINE 100 MG PO TABS: ORAL_TABLET | ORAL | 0 refills | Status: DC

## 2023-01-28 MED ORDER — SERTRALINE HCL 100 MG PO TABS: 100.0000 mg | ORAL_TABLET | Freq: Two times a day (BID) | ORAL | 0 refills | Status: DC

## 2023-01-28 MED ORDER — QUETIAPINE FUMARATE 300 MG PO TABS
300.0000 mg | ORAL_TABLET | Freq: Every day | ORAL | 0 refills | Status: DC
Start: 2023-01-28 — End: 2023-03-25

## 2023-01-28 MED ORDER — AMPHETAMINE-DEXTROAMPHETAMINE 30 MG PO TABS
30.0000 mg | ORAL_TABLET | Freq: Two times a day (BID) | ORAL | 0 refills | Status: DC
Start: 2023-01-28 — End: 2023-03-25
  Filled 2023-01-28: qty 60, 30d supply, fill #0
  Filled ????-??-??: fill #0

## 2023-01-28 MED ORDER — QUETIAPINE FUMARATE 50 MG PO TABS: ORAL_TABLET | ORAL | 0 refills | Status: DC

## 2023-01-28 NOTE — Progress Notes (Signed)
Virtual Visit via Video Note  I connected with Melissa Ochoa on 01/28/23 at  3:00 PM EDT by a video enabled telemedicine application and verified that I am speaking with the correct person using two identifiers.  Location: Patient: home Provider: office   I discussed the limitations of evaluation and management by telemedicine and the availability of in person appointments. The patient expressed understanding and agreed to proceed.  History of Present Illness: Last week Melissa Ochoa wasn't feeling good and cancelled our appointment. This week she has been better. "I am doing ok, I have had some stressful times but I am going to be ok". Melissa Ochoa is overwhelmed, frustrated and gets agitated easily. The feelings come and go a couple of times a week. Anything can set it off and gave the example of having trouble with the IRS. It takes a while for Melissa Ochoa to calm down when she gets agitated. The Xanax helps to decrease the overall level of anxiety. The depression comes and goes. Different thoughts and stressors bring her down. She misses her dad and grandmother a lot and seeing their pictures sometimes makes her cry. Melissa Ochoa doesn't have much support outside of her immediately family. Her brothers are not involved in her life and she is not close to them. It seems like everyone has a close relationships with their siblings except for her. She denies SI/HI. She denies any manic or hypomanic like episodes. Melissa Ochoa finds she can't get over things easily. She carries it inside and then ends up exploding at the people around her. The ADHD seems well controlled with Adderall. She sometimes forgets to take her AM on time and will end up taking it around 11am. At times she is not on the Adderall she is not to concentration. She has not been able to get Adderall 30mg  tabs filled and last month it she was able to get 20mg s.     Observations/Objective: Psychiatric Specialty Exam: ROS  There were no vitals taken for  this visit.There is no height or weight on file to calculate BMI.  General Appearance: Fairly Groomed and Neat  Eye Contact:  Good  Speech:  Clear and Coherent and Normal Rate  Volume:  Normal  Mood:  Anxious and Depressed  Affect:  Full Range  Thought Process:  Goal Directed, Linear, and Descriptions of Associations: Intact  Orientation:  Full (Time, Place, and Person)  Thought Content:  Logical  Suicidal Thoughts:  No  Homicidal Thoughts:  No  Memory:  Immediate;   Good  Judgement:  Good  Insight:  Good  Psychomotor Activity:  Normal  Concentration:  Concentration: Good  Recall:  Good  Fund of Knowledge:  Good  Language:  Good  Akathisia:  No  Handed:  Right  AIMS (if indicated):     Assets:  Communication Skills Desire for Improvement Financial Resources/Insurance Housing Intimacy Resilience Talents/Skills Transportation Vocational/Educational  ADL's:  Intact  Cognition:  WNL  Sleep:        Assessment and Plan:     05/07/2022    1:17 PM 02/05/2022    3:29 PM 01/01/2022   11:07 AM 08/28/2021    1:48 PM 03/13/2021    2:37 PM  Depression screen PHQ 2/9  Decreased Interest 2 0 1 2 0  Down, Depressed, Hopeless 3 1 3 2 1   PHQ - 2 Score 5 1 4 4 1   Altered sleeping 3 0 1 2   Tired, decreased energy 3 2 2 1    Change in appetite  1 2 2  0   Feeling bad or failure about yourself  3 2 3 3    Trouble concentrating 1 0 2 0   Moving slowly or fidgety/restless 0 0 0 0   Suicidal thoughts 0 0 0 0   PHQ-9 Score 16 7 14 10    Difficult doing work/chores Extremely dIfficult Somewhat difficult Extremely dIfficult Very difficult     Flowsheet Row Admission (Discharged) from IR [facility] from 05/28/2022 in [facility] [place] [facility] Video Visit from 05/07/2022 in [facility] from 03/23/2022 in [facility]  C-SSRS RISK CATEGORY No Risk No Risk No Risk           Pt is aware that these meds carry a teratogenic risk. Pt will discuss plan of action if she does or plans to become pregnant in the future.  Status of current problems: ongoing stressors contribute to depression, anxiety and irritability. ADHD   Medication management with supportive therapy. Risks and benefits, side effects and alternative treatment options discussed with patient. Pt was given an opportunity to ask questions about medication, illness, and treatment. All current psychiatric medications have been reviewed and discussed with the patient and adjusted as clinically appropriate.  Pt verbalized understanding and verbal consent obtained for treatment.  Meds:  1. GAD (generalized anxiety disorder) - ALPRAZolam (XANAX) 1 MG tablet; Take 1 tablet (1 mg total) by mouth 4 (four) times daily as needed for anxiety.  Dispense: 120 tablet; Refill: 2 - QUEtiapine (SEROQUEL) 300 MG tablet; Take 1 tablet (300 mg total) by mouth at bedtime.  Dispense: 90 tablet; Refill: 0 - sertraline (ZOLOFT) 100 MG tablet; Take 1 tablet (100 mg total) by mouth in the morning and at bedtime.  Dispense: 180 tablet; Refill: 0  2. Attention deficit hyperactivity disorder (ADHD), predominantly inattentive type - amphetamine-dextroamphetamine (ADDERALL) 30 MG tablet; Take 1 tablet by mouth 2 (two) times daily as needed (attention deficit).  Dispense: 60 tablet; Refill: 0 - amphetamine-dextroamphetamine (ADDERALL) 30 MG tablet; Take 1 tablet by mouth 2 (two) times daily.  Dispense: 60 tablet; Refill: 0 - amphetamine-dextroamphetamine (ADDERALL) 30 MG tablet; Take 1 tablet by mouth 2 (two) times daily.  Dispense: 60 tablet; Refill: 0  3. Bipolar I disorder, most recent episode depressed (HCC) - lamoTRIgine (LAMICTAL) 100 MG tablet; TAKE 1 TABLET(100 MG) BY MOUTH TWICE DAILY  Dispense: 180 tablet; Refill: 0 - QUEtiapine (SEROQUEL) 300 MG tablet; Take 1 tablet (300 mg total) by mouth at bedtime.  Dispense: 90  tablet; Refill: 0 - sertraline (ZOLOFT) 100 MG tablet; Take 1 tablet (100 mg total) by mouth in the morning and at bedtime.  Dispense: 180 tablet; Refill: 0     Labs: reviewed labs done 11/11/22 creatinine 1.03, cholesterol 280,CBC WNL , Prolactin 9.7, HbA1c 5.8, TSH WNL. She is working with her PCP for metabolic mangement   Therapy: brief supportive therapy provided. Discussed psychosocial stressors in detail.   Collaboration of Care: Other none today  Patient/Guardian was advised Release of Information must be obtained prior to any record release in order to collaborate their care with an outside provider. Patient/Guardian was advised if they have not already done so to contact the registration department to sign all necessary forms in order for [place] to release information regarding their care.   Consent: Patient/Guardian gives verbal consent for treatment and assignment of benefits for services provided during this visit. Patient/Guardian expressed understanding and agreed to proceed.  Follow Up Instructions: Follow up in 2-3 months or sooner if needed  Patient informed that I am leaving [facility] in 03/2023 and I relayed that they will be getting a new provider after that. Patient verbalized understanding and agreed with the plan.     I discussed the assessment and treatment plan with the patient. The patient was provided an opportunity to ask questions and all were answered. The patient agreed with the plan and demonstrated an understanding of the instructions.   The patient was advised to call back or seek an in-person evaluation if the symptoms worsen or if the condition fails to improve as anticipated.  I provided 26 minutes of non-face-to-face time during this encounter.   Oletta Darter, MD

## 2023-02-01 ENCOUNTER — Encounter (HOSPITAL_COMMUNITY): Payer: Self-pay

## 2023-02-10 DIAGNOSIS — R519 Headache, unspecified: Secondary | ICD-10-CM | POA: Diagnosis not present

## 2023-02-10 DIAGNOSIS — J029 Acute pharyngitis, unspecified: Secondary | ICD-10-CM | POA: Diagnosis not present

## 2023-02-10 DIAGNOSIS — J020 Streptococcal pharyngitis: Secondary | ICD-10-CM | POA: Diagnosis not present

## 2023-02-15 DIAGNOSIS — J029 Acute pharyngitis, unspecified: Secondary | ICD-10-CM | POA: Diagnosis not present

## 2023-02-15 DIAGNOSIS — B370 Candidal stomatitis: Secondary | ICD-10-CM | POA: Diagnosis not present

## 2023-02-24 ENCOUNTER — Other Ambulatory Visit (HOSPITAL_COMMUNITY): Payer: Self-pay | Admitting: Psychiatry

## 2023-03-03 ENCOUNTER — Other Ambulatory Visit (HOSPITAL_COMMUNITY): Payer: Self-pay

## 2023-03-05 ENCOUNTER — Other Ambulatory Visit (HOSPITAL_BASED_OUTPATIENT_CLINIC_OR_DEPARTMENT_OTHER): Payer: Self-pay

## 2023-03-12 ENCOUNTER — Telehealth: Payer: Self-pay | Admitting: Diagnostic Radiology

## 2023-03-12 NOTE — Telephone Encounter (Signed)
Left message to see if she wanted to do the embolization

## 2023-03-24 ENCOUNTER — Other Ambulatory Visit (HOSPITAL_BASED_OUTPATIENT_CLINIC_OR_DEPARTMENT_OTHER): Payer: Self-pay

## 2023-03-25 ENCOUNTER — Other Ambulatory Visit (HOSPITAL_BASED_OUTPATIENT_CLINIC_OR_DEPARTMENT_OTHER): Payer: Self-pay

## 2023-03-25 ENCOUNTER — Telehealth (HOSPITAL_BASED_OUTPATIENT_CLINIC_OR_DEPARTMENT_OTHER): Payer: Medicare Other | Admitting: Psychiatry

## 2023-03-25 DIAGNOSIS — F900 Attention-deficit hyperactivity disorder, predominantly inattentive type: Secondary | ICD-10-CM | POA: Diagnosis not present

## 2023-03-25 DIAGNOSIS — F411 Generalized anxiety disorder: Secondary | ICD-10-CM

## 2023-03-25 DIAGNOSIS — F3130 Bipolar disorder, current episode depressed, mild or moderate severity, unspecified: Secondary | ICD-10-CM | POA: Diagnosis not present

## 2023-03-25 MED ORDER — SERTRALINE HCL 100 MG PO TABS: 100.0000 mg | ORAL_TABLET | Freq: Two times a day (BID) | ORAL | 0 refills | Status: DC

## 2023-03-25 MED ORDER — QUETIAPINE FUMARATE 50 MG PO TABS: ORAL_TABLET | ORAL | 0 refills | Status: DC

## 2023-03-25 MED ORDER — AMPHETAMINE-DEXTROAMPHETAMINE 30 MG PO TABS
30.0000 mg | ORAL_TABLET | Freq: Two times a day (BID) | ORAL | 0 refills | Status: DC
Start: 2023-03-25 — End: 2023-08-05
  Filled 2023-03-25 – 2023-03-26 (×2): qty 60, 30d supply, fill #0

## 2023-03-25 MED ORDER — QUETIAPINE FUMARATE 300 MG PO TABS: 300.0000 mg | ORAL_TABLET | Freq: Every day | ORAL | 0 refills | Status: DC

## 2023-03-25 MED ORDER — AMPHETAMINE-DEXTROAMPHETAMINE 30 MG PO TABS
30.0000 mg | ORAL_TABLET | Freq: Two times a day (BID) | ORAL | 0 refills | Status: DC | PRN
Start: 2023-03-25 — End: 2023-06-22
  Filled 2023-03-25 – 2023-04-23 (×2): qty 60, 30d supply, fill #0

## 2023-03-25 MED ORDER — ALPRAZOLAM 1 MG PO TABS
1.0000 mg | ORAL_TABLET | Freq: Four times a day (QID) | ORAL | 2 refills | Status: AC | PRN
Start: 2023-03-25 — End: ?

## 2023-03-25 MED ORDER — AMPHETAMINE-DEXTROAMPHETAMINE 30 MG PO TABS
30.0000 mg | ORAL_TABLET | Freq: Two times a day (BID) | ORAL | 0 refills | Status: DC
Start: 2023-05-22 — End: 2023-08-05
  Filled 2023-03-25 – 2023-05-22 (×2): qty 60, 30d supply, fill #0

## 2023-03-25 MED ORDER — LAMOTRIGINE 100 MG PO TABS
ORAL_TABLET | ORAL | 0 refills | Status: AC
Start: 2023-03-25 — End: ?

## 2023-03-25 NOTE — Progress Notes (Signed)
Virtual Visit via Video Note  I connected with Melissa Ochoa on 03/25/23 at  2:00 PM EDT by a video enabled telemedicine application and verified that I am speaking with the correct person using two identifiers.  Location: Patient: home Provider: office   I discussed the limitations of evaluation and management by telemedicine and the availability of in person appointments. The patient expressed understanding and agreed to proceed.  History of Present Illness: Melissa Ochoa is about to leave to go to [place] with her daughter. She is feeling a little overwhelmed. She had a few stress induced panic attacks on the same day earlier in the week. Her anxiety is up due to stressors. Her depression is ongoing and last for days when she has to deal with multiple stressors.  She had a fungal infection in her mouth that spread to her fingers. It has now resolved. Right after that she got TMJ. Her left knee is hurting. Her right knee always hurt. She has had a full knee replacement on her right side. Her health remains a big stressor. She hates going out but tries to. She is endorsing some anhedonia. Her sleep is good. Her energy is poor when having severe knee pain. Her appetite has decreased. Her focus is good with Adderall. Pt denies recent manic and hypomanic symptoms including periods of decreased need for sleep, increased energy, mood lability, impulsivity, FOI, and excessive spending. Her meds feel effective and she wants to continue it.      Observations/Objective: Psychiatric Specialty Exam: ROS  There were no vitals taken for this visit.There is no height or weight on file to calculate BMI.  General Appearance: Casual and Fairly Groomed  Eye Contact:  Good  Speech:  Clear and Coherent and Normal Rate  Volume:  Normal  Mood:  Anxious and Depressed  Affect:  Full Range  Thought Process:  Goal Directed, Linear, and Descriptions of Associations: Intact  Orientation:  Full (Time, Place, and  Person)  Thought Content:  Logical  Suicidal Thoughts:  No  Homicidal Thoughts:  No  Memory:  Immediate;   Good  Judgement:  Good  Insight:  Good  Psychomotor Activity:  Normal  Concentration:  Concentration: Good  Recall:  Good  Fund of Knowledge:  Good  Language:  Good  Akathisia:  No  Handed:  Right  AIMS (if indicated):     Assets:  Communication Skills Desire for Improvement Financial Resources/Insurance Housing Intimacy Resilience Social Support Talents/Skills Transportation Vocational/Educational  ADL's:  Intact  Cognition:  WNL  Sleep:        Assessment and Plan:     03/25/2023    2:17 PM 05/07/2022    1:17 PM 02/05/2022    3:29 PM 01/01/2022   11:07 AM 08/28/2021    1:48 PM  Depression screen PHQ 2/9  Decreased Interest 2 2 0 1 2  Down, Depressed, Hopeless 2 3 1 3 2   PHQ - 2 Score 4 5 1 4 4   Altered sleeping 0 3 0 1 2  Tired, decreased energy 2 3 2 2 1   Change in appetite 2 1 2 2  0  Feeling bad or failure about yourself  3 3 2 3 3   Trouble concentrating 0 1 0 2 0  Moving slowly or fidgety/restless 0 0 0 0 0  Suicidal thoughts 0 0 0 0 0  PHQ-9 Score 11 16 7 14 10   Difficult doing work/chores Very difficult Extremely dIfficult Somewhat difficult Extremely dIfficult Very difficult  Flowsheet Row Video Visit from 03/25/2023 in [facility] Admission (Discharged) from IR [facility]1/[facility]IR2/[facility]IR1 from 05/28/2022 in [facility] [place] [facility] Video Visit from 05/07/2022 in [facility]  C-SSRS RISK CATEGORY No Risk No Risk No Risk          Pt is aware that these meds carry a teratogenic risk. Pt will discuss plan of action if she does or plans to become pregnant in the future.  Status of current problems: stable   Medication management with supportive therapy. Risks and benefits, side effects and alternative treatment options discussed with patient. Pt was given an opportunity to  ask questions about medication, illness, and treatment. All current psychiatric medications have been reviewed and discussed with the patient and adjusted as clinically appropriate.  Pt verbalized understanding and verbal consent obtained for treatment.  Meds:  1. GAD (generalized anxiety disorder) - ALPRAZolam (XANAX) 1 MG tablet; Take 1 tablet (1 mg total) by mouth 4 (four) times daily as needed for anxiety.  Dispense: 120 tablet; Refill: 2 - QUEtiapine (SEROQUEL) 300 MG tablet; Take 1 tablet (300 mg total) by mouth at bedtime.  Dispense: 90 tablet; Refill: 0 - sertraline (ZOLOFT) 100 MG tablet; Take 1 tablet (100 mg total) by mouth in the morning and at bedtime.  Dispense: 180 tablet; Refill: 0  2. Bipolar I disorder, most recent episode depressed (HCC) - lamoTRIgine (LAMICTAL) 100 MG tablet; TAKE 1 TABLET(100 MG) BY MOUTH TWICE DAILY  Dispense: 180 tablet; Refill: 0 - QUEtiapine (SEROQUEL) 300 MG tablet; Take 1 tablet (300 mg total) by mouth at bedtime.  Dispense: 90 tablet; Refill: 0 - sertraline (ZOLOFT) 100 MG tablet; Take 1 tablet (100 mg total) by mouth in the morning and at bedtime.  Dispense: 180 tablet; Refill: 0  3. Attention deficit hyperactivity disorder (ADHD), predominantly inattentive type - amphetamine-dextroamphetamine (ADDERALL) 30 MG tablet; Take 1 tablet by mouth 2 (two) times daily.  Dispense: 60 tablet; Refill: 0 - amphetamine-dextroamphetamine (ADDERALL) 30 MG tablet; Take 1 tablet by mouth 2 (two) times daily.  Dispense: 60 tablet; Refill: 0 - amphetamine-dextroamphetamine (ADDERALL) 30 MG tablet; Take 1 tablet by mouth 2 (two) times daily as needed (attention deficit).  Dispense: 60 tablet; Refill: 0     Labs: next due to 11/17/22    Therapy: brief supportive therapy provided. Discussed psychosocial stressors in detail.    Collaboration of Care: Other none  Patient/Guardian was advised Release of Information must be obtained prior to any record release in order  to collaborate their care with an outside provider. Patient/Guardian was advised if they have not already done so to contact the registration department to sign all necessary forms in order for [place] to release information regarding their care.   Consent: Patient/Guardian gives verbal consent for treatment and assignment of benefits for services provided during this visit. Patient/Guardian expressed understanding and agreed to proceed.     Follow Up Instructions: Follow up in 3 months or sooner if needed with a new psychiatrist  Patient informed that I am leaving [facility] in 03/2023 and I relayed that they will be getting a new provider after that. Patient verbalized understanding and agreed with the plan.     I discussed the assessment and treatment plan with the patient. The patient was provided an opportunity to ask questions and all were answered. The patient agreed with the plan and demonstrated an understanding of the instructions.   The patient was advised to call back or  seek an in-person evaluation if the symptoms worsen or if the condition fails to improve as anticipated.  I provided 14 minutes of non-face-to-face time during this encounter.   Oletta Darter, MD

## 2023-03-26 ENCOUNTER — Other Ambulatory Visit (HOSPITAL_BASED_OUTPATIENT_CLINIC_OR_DEPARTMENT_OTHER): Payer: Self-pay

## 2023-04-21 ENCOUNTER — Other Ambulatory Visit (HOSPITAL_COMMUNITY): Payer: Self-pay | Admitting: Psychiatry

## 2023-04-21 ENCOUNTER — Other Ambulatory Visit (HOSPITAL_BASED_OUTPATIENT_CLINIC_OR_DEPARTMENT_OTHER): Payer: Self-pay

## 2023-04-21 DIAGNOSIS — F411 Generalized anxiety disorder: Secondary | ICD-10-CM

## 2023-04-21 DIAGNOSIS — F3130 Bipolar disorder, current episode depressed, mild or moderate severity, unspecified: Secondary | ICD-10-CM

## 2023-04-21 DIAGNOSIS — M25562 Pain in left knee: Secondary | ICD-10-CM | POA: Diagnosis not present

## 2023-04-23 ENCOUNTER — Other Ambulatory Visit (HOSPITAL_BASED_OUTPATIENT_CLINIC_OR_DEPARTMENT_OTHER): Payer: Self-pay

## 2023-04-23 ENCOUNTER — Other Ambulatory Visit (HOSPITAL_COMMUNITY): Payer: Self-pay

## 2023-04-27 ENCOUNTER — Other Ambulatory Visit (HOSPITAL_COMMUNITY): Payer: Self-pay | Admitting: Psychiatry

## 2023-04-27 DIAGNOSIS — F3130 Bipolar disorder, current episode depressed, mild or moderate severity, unspecified: Secondary | ICD-10-CM

## 2023-04-27 DIAGNOSIS — F411 Generalized anxiety disorder: Secondary | ICD-10-CM

## 2023-05-06 ENCOUNTER — Other Ambulatory Visit (HOSPITAL_COMMUNITY): Payer: Self-pay | Admitting: Psychiatry

## 2023-05-06 DIAGNOSIS — F3130 Bipolar disorder, current episode depressed, mild or moderate severity, unspecified: Secondary | ICD-10-CM

## 2023-05-06 DIAGNOSIS — F411 Generalized anxiety disorder: Secondary | ICD-10-CM

## 2023-05-17 ENCOUNTER — Other Ambulatory Visit (HOSPITAL_BASED_OUTPATIENT_CLINIC_OR_DEPARTMENT_OTHER): Payer: Self-pay

## 2023-05-21 ENCOUNTER — Other Ambulatory Visit (HOSPITAL_BASED_OUTPATIENT_CLINIC_OR_DEPARTMENT_OTHER): Payer: Self-pay

## 2023-05-22 ENCOUNTER — Other Ambulatory Visit (HOSPITAL_BASED_OUTPATIENT_CLINIC_OR_DEPARTMENT_OTHER): Payer: Self-pay

## 2023-06-02 DIAGNOSIS — I10 Essential (primary) hypertension: Secondary | ICD-10-CM | POA: Insufficient documentation

## 2023-06-02 DIAGNOSIS — M25562 Pain in left knee: Secondary | ICD-10-CM | POA: Diagnosis not present

## 2023-06-02 DIAGNOSIS — Z1231 Encounter for screening mammogram for malignant neoplasm of breast: Secondary | ICD-10-CM | POA: Diagnosis not present

## 2023-06-02 DIAGNOSIS — Z01419 Encounter for gynecological examination (general) (routine) without abnormal findings: Secondary | ICD-10-CM | POA: Diagnosis not present

## 2023-06-08 DIAGNOSIS — M25562 Pain in left knee: Secondary | ICD-10-CM | POA: Diagnosis not present

## 2023-06-10 ENCOUNTER — Telehealth (HOSPITAL_COMMUNITY): Payer: Self-pay | Admitting: *Deleted

## 2023-06-10 NOTE — Telephone Encounter (Signed)
Former pt of Dr. Michae Kava who will see you on 06/29/23 called stating that she is leaving the country for 2 weeks as of 06/12/23 and is requesting a 2 week fill of the Xanax 1 mg QID PRN, and Adderall 30 mg BID. Writer advised pt that with both meds being controls and one being a stimulant, and having not seen you yet that she may have to wait until her appointment on 06/29/23 however that writer would advise you. Pt verbalizes understanding. Please review and advise. Thanks.

## 2023-06-11 NOTE — Telephone Encounter (Signed)
They are controlled substance and they cannot be renewed until she had appointment.

## 2023-06-19 ENCOUNTER — Other Ambulatory Visit (HOSPITAL_BASED_OUTPATIENT_CLINIC_OR_DEPARTMENT_OTHER): Payer: Self-pay

## 2023-06-19 ENCOUNTER — Other Ambulatory Visit (HOSPITAL_COMMUNITY): Payer: Self-pay | Admitting: Psychiatry

## 2023-06-19 DIAGNOSIS — F900 Attention-deficit hyperactivity disorder, predominantly inattentive type: Secondary | ICD-10-CM

## 2023-06-21 ENCOUNTER — Other Ambulatory Visit (HOSPITAL_BASED_OUTPATIENT_CLINIC_OR_DEPARTMENT_OTHER): Payer: Self-pay

## 2023-06-22 ENCOUNTER — Other Ambulatory Visit (HOSPITAL_BASED_OUTPATIENT_CLINIC_OR_DEPARTMENT_OTHER): Payer: Self-pay

## 2023-06-22 ENCOUNTER — Telehealth (HOSPITAL_COMMUNITY): Payer: Self-pay

## 2023-06-22 DIAGNOSIS — F900 Attention-deficit hyperactivity disorder, predominantly inattentive type: Secondary | ICD-10-CM

## 2023-06-22 MED ORDER — AMPHETAMINE-DEXTROAMPHETAMINE 30 MG PO TABS
30.0000 mg | ORAL_TABLET | Freq: Two times a day (BID) | ORAL | 0 refills | Status: DC | PRN
Start: 2023-06-22 — End: 2023-09-02
  Filled 2023-06-22: qty 30, 15d supply, fill #0

## 2023-06-22 NOTE — Telephone Encounter (Signed)
I have provided 15-day supply until her next appointment.  She should keep future appointment.

## 2023-06-22 NOTE — Telephone Encounter (Signed)
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 Last filled on 05/22/2023. Patient has been rescheduled and is now being seen on 10/1. Pharmacy is [facility]. Please review and advise, thank you

## 2023-06-23 DIAGNOSIS — M170 Bilateral primary osteoarthritis of knee: Secondary | ICD-10-CM | POA: Diagnosis not present

## 2023-06-25 ENCOUNTER — Other Ambulatory Visit (HOSPITAL_COMMUNITY): Payer: Self-pay | Admitting: Psychiatry

## 2023-06-29 ENCOUNTER — Other Ambulatory Visit: Payer: Self-pay

## 2023-06-29 ENCOUNTER — Encounter (HOSPITAL_COMMUNITY): Payer: Self-pay | Admitting: Psychiatry

## 2023-06-29 ENCOUNTER — Telehealth (HOSPITAL_BASED_OUTPATIENT_CLINIC_OR_DEPARTMENT_OTHER): Payer: Medicare Other | Admitting: Psychiatry

## 2023-06-29 ENCOUNTER — Other Ambulatory Visit (HOSPITAL_BASED_OUTPATIENT_CLINIC_OR_DEPARTMENT_OTHER): Payer: Self-pay

## 2023-06-29 DIAGNOSIS — F3130 Bipolar disorder, current episode depressed, mild or moderate severity, unspecified: Secondary | ICD-10-CM

## 2023-06-29 DIAGNOSIS — F900 Attention-deficit hyperactivity disorder, predominantly inattentive type: Secondary | ICD-10-CM

## 2023-06-29 DIAGNOSIS — F411 Generalized anxiety disorder: Secondary | ICD-10-CM

## 2023-06-29 MED ORDER — QUETIAPINE FUMARATE 300 MG PO TABS
300.0000 mg | ORAL_TABLET | Freq: Every day | ORAL | 0 refills | Status: DC
  Filled 2023-06-29: qty 90, 90d supply, fill #0

## 2023-06-29 MED ORDER — LAMOTRIGINE 100 MG PO TABS
100.0000 mg | ORAL_TABLET | Freq: Two times a day (BID) | ORAL | 0 refills | Status: DC
  Filled 2023-06-29: qty 180, 90d supply, fill #0

## 2023-06-29 MED ORDER — ALPRAZOLAM 1 MG PO TABS
1.0000 mg | ORAL_TABLET | Freq: Four times a day (QID) | ORAL | 1 refills | Status: DC | PRN
Start: 2023-06-29 — End: 2023-09-13
  Filled 2023-06-29 – 2023-07-15 (×2): qty 120, 30d supply, fill #0
  Filled 2023-08-11 – 2023-08-12 (×2): qty 120, 30d supply, fill #1

## 2023-06-29 MED ORDER — AMPHETAMINE-DEXTROAMPHETAMINE 20 MG PO TABS
20.0000 mg | ORAL_TABLET | Freq: Two times a day (BID) | ORAL | 0 refills | Status: DC
Start: 2023-06-29 — End: 2023-08-05
  Filled 2023-06-29: qty 60, 30d supply, fill #0

## 2023-06-29 NOTE — Progress Notes (Signed)
Psychiatric Initial Adult Assessment    Virtual Visit via Video Note  I connected with Melissa Ochoa on 06/29/23 at  1:00 PM EDT by a video enabled telemedicine application and verified that I am speaking with the correct person using two identifiers.  Location: Patient: Home Provider: Home Office   I discussed the limitations of evaluation and management by telemedicine and the availability of in person appointments. The patient expressed understanding and agreed to proceed.  Patient Identification: Melissa Ochoa MRN:  [id number] Date of Evaluation:  06/29/2023  Referral Source: Dr. Michae Kava  Chief Complaint:   Chief Complaint  Patient presents with   Establish Care   Visit Diagnosis:    ICD-10-CM   1. GAD (generalized anxiety disorder)  F41.1 QUEtiapine (SEROQUEL) 300 MG tablet    ALPRAZolam (XANAX) 1 MG tablet    2. Bipolar I disorder, most recent episode depressed (HCC)  F31.30 QUEtiapine (SEROQUEL) 300 MG tablet    lamoTRIgine (LAMICTAL) 100 MG tablet    3. Attention deficit hyperactivity disorder (ADHD), predominantly inattentive type  F90.0 amphetamine-dextroamphetamine (ADDERALL) 20 MG tablet      History of Present Illness: Melissa Ochoa is a 57 year old Caucasian, married female who is seeing Dr. Michae Kava who had left the practice.  She has been seeing Dr. Michae Kava since 2015.  She has a history of bipolar disorder, anxiety and ADHD.  She also reported history of panic attacks but lately under control.  She feels the current medicine is working but is still there are times when she feels overwhelmed and anxious.  She has chronic symptoms of depression and irritability but lately her sleep is improved.  She is taking Seroquel 300 mg at bedtime.  She was given extra 50 mg Seroquel but she does not need it as sleep is better.  She also reported chronic knee pain and recently given steroids and pain medicine that she finished.  She lives with her husband.  She has a good  relationship with her 2 daughters who are 3 and 49 year old.  She also had 24-year-old and 45-year-old grandchild and she is very close to them.  Patient reported her daughter lives close by and she is see them on a regular basis.  She also taking Xanax 1 mg up to 4 times a day for anxiety and panic attack.  Her Adderall is 30 mg instant release twice a day.  She reported it works good because she is able to focus and multitasking.  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 but she had stopped working.  She denies any recent manic episode but reported there has a history of lashing out, throwing things, slamming doors.  Her primary care is at [facility] and she sees Dr. Clelia Croft.  She is taking 2 antihypertensive medication.  She denies any history of seizures.  She denies any illegal substance use.  She reported her appetite is okay and weight is unchanged from the past.  She has no rash, itching, tremors or shakes.  When I ask about if she recalled any psychological testing to establish diagnosis of ADHD, patient reported she may have but do not recall.  She has been taking psychotropic medication since her late 59s.  She recalled Paxil did not work.  She also tried Klonopin that did not work either.  As per EMR she also tried Ritalin but do not remember.  Past Psychiatric History: History of bipolar disorder, anxiety, ADHD.  Had tried Klonopin, Paxil did not like.  No history of suicidal attempt, psychosis but reported history of mania.  Saw Dr. Earnstine Regal and than Dr. Michae Kava since 2015 until she left the practice.  No history of seizures.  Previous Psychotropic Medications: Yes   Substance Abuse History in the last 12 months:  No.  Consequences of Substance Abuse: NA  Past Medical History:  Past Medical History:  Diagnosis Date   ADHD (attention deficit hyperactivity disorder)    Anxiety    Bipolar disorder (HCC)    Cervical disc disease     Colon polyps    adenomatous   Depression    Hypertension    Iron deficiency    Kidney disease    renal angiomyolipomas   Liver cyst    Melanosis    Pneumonia    Tuberous sclerosis (HCC)     Past Surgical History:  Procedure Laterality Date   COLONOSCOPY W/ ENDOSCOPIC [place]     IR EMBO TUMOR ORGAN ISCHEMIA INFARCT INC GUIDE ROADMAPPING  05/28/2022   IR RADIOLOGIST EVAL & MGMT  02/24/2022   IR RADIOLOGIST EVAL & MGMT  07/21/2022   IR RENAL SUPRASEL UNI S&I MOD SED  03/23/2022   IR RENAL SUPRASEL UNI S&I MOD SED  05/28/2022   IR US GUIDE VASC ACCESS RIGHT  03/23/2022   IR US GUIDE VASC ACCESS RIGHT  05/28/2022   KNEE ARTHROSCOPY WITH DRILLING/MICROFRACTURE Right 09/27/2018   Procedure: Right knee arthroscopy with partial lateral menisectomy;  Surgeon: Ranee Gosselin, MD;  Location: [facility];  Service: Orthopedics;  Laterality: Right;   NEPHRECTOMY Right    RENAL ARTERY EMBOLIZATION     2 procdures in the left , 4 in the right     TOTAL KNEE ARTHROPLASTY Right 03/27/2019   Procedure: TOTAL KNEE ARTHROPLASTY;  Surgeon: Ollen Gross, MD;  Location: [facility];  Service: Orthopedics;  Laterality: Right;     Family Psychiatric History: Reviewed  Family History:  Family History  Problem Relation Age of Onset   Anxiety disorder Mother    ADD / ADHD Mother    Depression Mother    Lung cancer Father        smoker   Bipolar disorder Daughter    Bipolar disorder Other    Diabetes Other        a lot of relatives   Suicidality Neg Hx     Social History:   Social History   Socioeconomic History   Marital status: Married    Spouse name: Not on file   Number of children: Not on file   Years of education: Not on file   Highest education level: Not on file  Occupational History   Not on file  Tobacco Use   Smoking status: Never   Smokeless tobacco: Never  Vaping Use   Vaping status: Never Used  Substance and Sexual Activity   Alcohol use: No    Alcohol/week: 0.0 standard drinks  of alcohol   Drug use: No   Sexual activity: Yes    Partners: Male    Birth control/protection: None  Other Topics Concern   Not on file  Social History Narrative   Not on file   Social Determinants of Health   Financial Resource Strain: Not on file  Food Insecurity: Not on file  Transportation Needs: Not on file  Physical Activity: Not on file  Stress: Not on file  Social Connections: Not on file    Additional Social History: Patient born  and raised in [place].  She lives with her husband.  She has 66 and 61 year old daughter.  She is very close to her 40-year-old and 101-year-old grandchildren.  Patient told she grew up with 3 brothers and female cousins.  She believes very fortunate that she has 2 daughters who are very caring and actually one of her daughter helped her to establish getting psychiatric help when she was not doing well.    Allergies:  No Known Allergies  Metabolic Disorder Labs: Lab Results  Component Value Date   HGBA1C 5.8 (H) 11/11/2022   Lab Results  Component Value Date   PROLACTIN 9.7 11/11/2022   Lab Results  Component Value Date   CHOL 280 (H) 11/11/2022   TRIG 123 11/11/2022   HDL 74 11/11/2022   CHOLHDL 3.8 11/11/2022   LDLCALC 184 (H) 11/11/2022   Lab Results  Component Value Date   TSH 2.520 11/11/2022    Therapeutic Level Labs: No results found for: "LITHIUM" No results found for: "CBMZ" No results found for: "VALPROATE"  Current Medications: Current Outpatient Medications  Medication Sig Dispense Refill   ALPRAZolam (XANAX) 1 MG tablet Take 1 tablet (1 mg total) by mouth 4 (four) times daily as needed for anxiety. 120 tablet 2   amphetamine-dextroamphetamine (ADDERALL) 20 MG tablet Take 3 tablets (60 mg total) by mouth daily. (Patient not taking: Reported on 03/25/2023) 90 tablet 0   amphetamine-dextroamphetamine (ADDERALL) 20 MG tablet Take 3 tablets (60 mg total) by mouth daily. (Patient not taking: Reported on 03/25/2023) 90 tablet  0   amphetamine-dextroamphetamine (ADDERALL) 30 MG tablet Take 1 tablet by mouth 2 (two) times daily. 60 tablet 0   amphetamine-dextroamphetamine (ADDERALL) 30 MG tablet Take 1 tablet by mouth 2 (two) times daily. 60 tablet 0   amphetamine-dextroamphetamine (ADDERALL) 30 MG tablet Take 1 tablet by mouth 2 (two) times daily as needed (attention deficit). 30 tablet 0   cyclobenzaprine (FLEXERIL) 10 MG tablet Take 10 mg by mouth every 8 (eight) hours as needed for muscle spasms.     docusate sodium (COLACE) 100 MG capsule Take 1 capsule (100 mg total) by mouth 2 (two) times daily. 10 capsule 0   labetalol (NORMODYNE) 300 MG tablet Take 600 mg by mouth 3 (three) times daily.     lamoTRIgine (LAMICTAL) 100 MG tablet TAKE 1 TABLET(100 MG) BY MOUTH TWICE DAILY 180 tablet 0   losartan (COZAAR) 50 MG tablet Take 50 mg by mouth daily.      promethazine (PHENERGAN) 12.5 MG tablet Take 1 tablet (12.5 mg total) by mouth every 6 (six) hours as needed for nausea or vomiting. 10 tablet 0   promethazine (PHENERGAN) 25 MG tablet Take 0.5 tablets (12.5 mg total) by mouth every 8 (eight) hours as needed for nausea. 10 tablet 0   QUEtiapine (SEROQUEL) 300 MG tablet Take 1 tablet (300 mg total) by mouth at bedtime. 90 tablet 0   QUEtiapine (SEROQUEL) 50 MG tablet TAKE 1 TABLET(50 MG) BY MOUTH DAILY 90 tablet 0   sertraline (ZOLOFT) 100 MG tablet Take 1 tablet (100 mg total) by mouth in the morning and at bedtime. 180 tablet 0   No current facility-administered medications for this visit.    Musculoskeletal: Strength & Muscle Tone: within normal limits Gait & Station: normal Patient leans: N/A  Psychiatric Specialty Exam: Review of Systems  There were no vitals taken for this visit.There is no height or weight on file to calculate BMI.  General Appearance: Casual  Eye Contact:  Good  Speech:  Normal Rate  Volume:  Normal  Mood:  Anxious  Affect:  Congruent  Thought Process:  Goal Directed  Orientation:   Full (Time, Place, and Person)  Thought Content:  WDL  Suicidal Thoughts:  No  Homicidal Thoughts:  No  Memory:  Immediate;   Good Recent;   Good Remote;   Good  Judgement:  Good  Insight:  Good  Psychomotor Activity:  Normal  Concentration:  Concentration: Good and Attention Span: Good  Recall:  Good  Fund of Knowledge:Good  Language: Good  Akathisia:  No  Handed:  Right  AIMS (if indicated):  not done  Assets:  Communication Skills Desire for Improvement Housing Social Support Transportation  ADL's:  Intact  Cognition: WNL  Sleep:  Good   Screenings: PHQ2-9    Flowsheet Row Video Visit [facility] 03/25/2023 in [facility] Video Visit from 05/07/2022 in [facility] Video Visit from 02/05/2022 in [facility] Video Visit from 01/01/2022 in [facility] Video Visit from 08/28/2021 in [facility]  PHQ-2 Total Score 4 5 1 4 4   PHQ-9 Total Score 11 16 7 14 10       Flowsheet Row Video Visit from 03/25/2023 in [facility] Admission (Discharged) from IR [facility]IR1/[facility]IR2/[facility]1 from 05/28/2022 in [facility] [place] [facility] Video Visit from 05/07/2022 in [facility]  C-SSRS RISK CATEGORY No Risk No Risk No Risk       Assessment and Plan: Patient is 57 year old Caucasian, married female with a history of psychiatric illness.  I reviewed the chart, current medication, blood work results and collateral from other providers.  She is taking Lamictal 100 mg twice a day, Seroquel 300 mg at bedtime, Adderall IR 30 mg twice a day, Xanax 1 mg up to 4 times a day.  I discussed in detail about potential side effects, benefits, long-term effect.  I reviewed medical history, she has hypertension and tubular sclerosis, chronic pain in her  knee.  I recommend to cut down the Adderall from 30 mg twice a day to 20 mg twice a day given the history of hypertension and also explain stimulant can cause anxiety, insomnia, exacerbation of manic symptoms.  After some discussion patient agreed to cut down the dose.  I also discussed other choices as trying Vyvanse, Klonopin and discussed about the difference of benzodiazepine related to their half-life.  Patient promised that she will look into Vyvanse if new dose of Adderall did not work.  Patient is not requiring extra 50 mg of quetiapine at this time.  I recommend to call us back if she is any question or any concern.  Encouraged to monitor blood pressure.  Follow-up in 2 months.    Collaboration of Care: Other provider involved in patient's care AEB notes are available in epic to review  Patient/Guardian was advised Release of Information must be obtained prior to any record release in order to collaborate their care with an outside provider. Patient/Guardian was advised if they have not already done so to contact the registration department to sign all necessary forms in order for [place] to release information regarding their care.   Consent: Patient/Guardian gives verbal consent for treatment and assignment of benefits for services provided during this visit. Patient/Guardian expressed understanding and agreed to proceed.    Follow Up Instructions:    I discussed the assessment  and treatment plan with the patient. The patient was provided an opportunity to ask questions and all were answered. The patient agreed with the plan and demonstrated an understanding of the instructions.   The patient was advised to call back or seek an in-person evaluation if the symptoms worsen or if the condition fails to improve as anticipated.  I provided 61 minutes of non-face-to-face time during this encounter.  Cleotis Nipper, MD 10/1/202411:57 AM

## 2023-07-05 ENCOUNTER — Other Ambulatory Visit (HOSPITAL_BASED_OUTPATIENT_CLINIC_OR_DEPARTMENT_OTHER): Payer: Self-pay

## 2023-07-15 ENCOUNTER — Other Ambulatory Visit: Payer: Self-pay

## 2023-07-15 ENCOUNTER — Other Ambulatory Visit (HOSPITAL_BASED_OUTPATIENT_CLINIC_OR_DEPARTMENT_OTHER): Payer: Self-pay

## 2023-07-15 DIAGNOSIS — M1712 Unilateral primary osteoarthritis, left knee: Secondary | ICD-10-CM | POA: Diagnosis not present

## 2023-07-22 DIAGNOSIS — M1712 Unilateral primary osteoarthritis, left knee: Secondary | ICD-10-CM | POA: Diagnosis not present

## 2023-07-29 DIAGNOSIS — M1712 Unilateral primary osteoarthritis, left knee: Secondary | ICD-10-CM | POA: Diagnosis not present

## 2023-08-03 ENCOUNTER — Other Ambulatory Visit (HOSPITAL_BASED_OUTPATIENT_CLINIC_OR_DEPARTMENT_OTHER): Payer: Self-pay

## 2023-08-03 ENCOUNTER — Other Ambulatory Visit (HOSPITAL_COMMUNITY): Payer: Self-pay | Admitting: Psychiatry

## 2023-08-03 DIAGNOSIS — F900 Attention-deficit hyperactivity disorder, predominantly inattentive type: Secondary | ICD-10-CM

## 2023-08-04 ENCOUNTER — Telehealth (HOSPITAL_COMMUNITY): Payer: Self-pay | Admitting: *Deleted

## 2023-08-04 ENCOUNTER — Other Ambulatory Visit (HOSPITAL_BASED_OUTPATIENT_CLINIC_OR_DEPARTMENT_OTHER): Payer: Self-pay

## 2023-08-04 DIAGNOSIS — F900 Attention-deficit hyperactivity disorder, predominantly inattentive type: Secondary | ICD-10-CM

## 2023-08-04 NOTE — Telephone Encounter (Signed)
Pt called requesting a refill of the Adderall 30 mg BID. Last script sent by Dr. Michae Kava on 06/22/23. Per her visit note on 06/29/23 decreasing dose to 20 mg BID was discussed. Pt requesting 30 mg refill. Pt has an upcoming appointment scheduled for 08/10/23. Please review and advise.

## 2023-08-05 ENCOUNTER — Other Ambulatory Visit (HOSPITAL_BASED_OUTPATIENT_CLINIC_OR_DEPARTMENT_OTHER): Payer: Self-pay

## 2023-08-05 MED ORDER — AMPHETAMINE-DEXTROAMPHETAMINE 20 MG PO TABS
20.0000 mg | ORAL_TABLET | Freq: Two times a day (BID) | ORAL | 0 refills | Status: DC
  Filled 2023-08-05: qty 60, 30d supply, fill #0

## 2023-08-05 NOTE — Telephone Encounter (Signed)
Pt advised.

## 2023-08-05 NOTE — Telephone Encounter (Signed)
Due to hypertension and taking 2 medication to control the blood pressure I will not increase the dose and keep the Adderall 20 mg 2 times a day. I have send new prescription.

## 2023-08-10 ENCOUNTER — Telehealth (HOSPITAL_COMMUNITY): Payer: Medicare Other | Admitting: Psychiatry

## 2023-08-11 ENCOUNTER — Other Ambulatory Visit (HOSPITAL_BASED_OUTPATIENT_CLINIC_OR_DEPARTMENT_OTHER): Payer: Self-pay

## 2023-08-11 ENCOUNTER — Other Ambulatory Visit (HOSPITAL_COMMUNITY): Payer: Self-pay

## 2023-08-11 ENCOUNTER — Other Ambulatory Visit: Payer: Self-pay

## 2023-08-12 ENCOUNTER — Other Ambulatory Visit (HOSPITAL_BASED_OUTPATIENT_CLINIC_OR_DEPARTMENT_OTHER): Payer: Self-pay

## 2023-08-31 ENCOUNTER — Other Ambulatory Visit (HOSPITAL_BASED_OUTPATIENT_CLINIC_OR_DEPARTMENT_OTHER): Payer: Self-pay

## 2023-08-31 ENCOUNTER — Other Ambulatory Visit (HOSPITAL_COMMUNITY): Payer: Self-pay | Admitting: Psychiatry

## 2023-08-31 DIAGNOSIS — F900 Attention-deficit hyperactivity disorder, predominantly inattentive type: Secondary | ICD-10-CM

## 2023-09-02 ENCOUNTER — Telehealth (HOSPITAL_COMMUNITY): Payer: Self-pay | Admitting: *Deleted

## 2023-09-02 ENCOUNTER — Other Ambulatory Visit (HOSPITAL_BASED_OUTPATIENT_CLINIC_OR_DEPARTMENT_OTHER): Payer: Self-pay

## 2023-09-02 ENCOUNTER — Other Ambulatory Visit (HOSPITAL_COMMUNITY): Payer: Self-pay | Admitting: Psychiatry

## 2023-09-02 DIAGNOSIS — J019 Acute sinusitis, unspecified: Secondary | ICD-10-CM | POA: Diagnosis not present

## 2023-09-02 DIAGNOSIS — R519 Headache, unspecified: Secondary | ICD-10-CM | POA: Diagnosis not present

## 2023-09-02 DIAGNOSIS — F900 Attention-deficit hyperactivity disorder, predominantly inattentive type: Secondary | ICD-10-CM

## 2023-09-02 MED ORDER — AMPHETAMINE-DEXTROAMPHETAMINE 20 MG PO TABS
20.0000 mg | ORAL_TABLET | Freq: Two times a day (BID) | ORAL | 0 refills | Status: DC
  Filled 2023-09-02 – 2023-09-03 (×3): qty 60, 30d supply, fill #0

## 2023-09-02 NOTE — Telephone Encounter (Signed)
Pt called requesting refill of the generic Adderall 20 mg IR bid. Last Rx e-scribed on 08/05/23. Last visit was on 06/29/23 and she has a f/u appointment scheduled for 10/05/23. Please review.

## 2023-09-02 NOTE — Telephone Encounter (Signed)
Done

## 2023-09-03 ENCOUNTER — Other Ambulatory Visit: Payer: Self-pay

## 2023-09-03 ENCOUNTER — Other Ambulatory Visit (HOSPITAL_BASED_OUTPATIENT_CLINIC_OR_DEPARTMENT_OTHER): Payer: Self-pay

## 2023-09-08 ENCOUNTER — Other Ambulatory Visit (HOSPITAL_BASED_OUTPATIENT_CLINIC_OR_DEPARTMENT_OTHER): Payer: Self-pay

## 2023-09-08 ENCOUNTER — Other Ambulatory Visit (HOSPITAL_COMMUNITY): Payer: Self-pay | Admitting: Psychiatry

## 2023-09-08 DIAGNOSIS — K080 Exfoliation of teeth due to systemic causes: Secondary | ICD-10-CM | POA: Diagnosis not present

## 2023-09-08 DIAGNOSIS — F411 Generalized anxiety disorder: Secondary | ICD-10-CM

## 2023-09-10 ENCOUNTER — Other Ambulatory Visit (HOSPITAL_BASED_OUTPATIENT_CLINIC_OR_DEPARTMENT_OTHER): Payer: Self-pay

## 2023-09-10 ENCOUNTER — Other Ambulatory Visit (HOSPITAL_COMMUNITY): Payer: Self-pay | Admitting: Psychiatry

## 2023-09-10 DIAGNOSIS — F411 Generalized anxiety disorder: Secondary | ICD-10-CM

## 2023-09-11 ENCOUNTER — Other Ambulatory Visit (HOSPITAL_BASED_OUTPATIENT_CLINIC_OR_DEPARTMENT_OTHER): Payer: Self-pay

## 2023-09-13 ENCOUNTER — Other Ambulatory Visit (HOSPITAL_BASED_OUTPATIENT_CLINIC_OR_DEPARTMENT_OTHER): Payer: Self-pay

## 2023-09-13 ENCOUNTER — Other Ambulatory Visit (HOSPITAL_COMMUNITY): Payer: Self-pay

## 2023-09-13 DIAGNOSIS — F411 Generalized anxiety disorder: Secondary | ICD-10-CM

## 2023-09-13 MED ORDER — ALPRAZOLAM 1 MG PO TABS
1.0000 mg | ORAL_TABLET | Freq: Four times a day (QID) | ORAL | 0 refills | Status: DC | PRN
  Filled 2023-09-13: qty 120, 30d supply, fill #0

## 2023-09-14 DIAGNOSIS — K080 Exfoliation of teeth due to systemic causes: Secondary | ICD-10-CM | POA: Diagnosis not present

## 2023-10-01 ENCOUNTER — Other Ambulatory Visit (HOSPITAL_BASED_OUTPATIENT_CLINIC_OR_DEPARTMENT_OTHER): Payer: Self-pay

## 2023-10-01 ENCOUNTER — Other Ambulatory Visit (HOSPITAL_COMMUNITY): Payer: Self-pay

## 2023-10-01 DIAGNOSIS — F3130 Bipolar disorder, current episode depressed, mild or moderate severity, unspecified: Secondary | ICD-10-CM

## 2023-10-01 DIAGNOSIS — F411 Generalized anxiety disorder: Secondary | ICD-10-CM

## 2023-10-01 MED ORDER — QUETIAPINE FUMARATE 300 MG PO TABS
300.0000 mg | ORAL_TABLET | Freq: Every day | ORAL | 0 refills | Status: DC
  Filled 2023-10-01: qty 30, 30d supply, fill #0

## 2023-10-04 ENCOUNTER — Telehealth (HOSPITAL_COMMUNITY): Payer: Self-pay

## 2023-10-04 NOTE — Telephone Encounter (Signed)
 Her last prescription shows to be filled on December 7.  She should not be getting early refills.

## 2023-10-04 NOTE — Telephone Encounter (Signed)
 Patient is calling for a refill on her Adderall, last filled on 12/5, patient has an appointment tomorrow but is out today - she said she knows she sees you tomorrow but it would be more convenient to pick up her prescription today.

## 2023-10-05 ENCOUNTER — Encounter (HOSPITAL_COMMUNITY): Payer: Self-pay | Admitting: Psychiatry

## 2023-10-05 ENCOUNTER — Telehealth (HOSPITAL_COMMUNITY): Payer: Medicare Other | Admitting: Psychiatry

## 2023-10-05 VITALS — Wt 180.0 lb

## 2023-10-05 DIAGNOSIS — F3130 Bipolar disorder, current episode depressed, mild or moderate severity, unspecified: Secondary | ICD-10-CM | POA: Diagnosis not present

## 2023-10-05 DIAGNOSIS — F900 Attention-deficit hyperactivity disorder, predominantly inattentive type: Secondary | ICD-10-CM

## 2023-10-05 DIAGNOSIS — F411 Generalized anxiety disorder: Secondary | ICD-10-CM | POA: Diagnosis not present

## 2023-10-05 MED ORDER — QUETIAPINE FUMARATE 300 MG PO TABS: 300.0000 mg | ORAL_TABLET | Freq: Every day | ORAL | 1 refills | Status: DC

## 2023-10-05 MED ORDER — ALPRAZOLAM 1 MG PO TABS: 1.0000 mg | ORAL_TABLET | Freq: Three times a day (TID) | ORAL | 1 refills | Status: DC | PRN

## 2023-10-05 MED ORDER — AMPHETAMINE-DEXTROAMPHETAMINE 20 MG PO TABS: 20.0000 mg | ORAL_TABLET | Freq: Two times a day (BID) | ORAL | 0 refills | Status: DC

## 2023-10-05 MED ORDER — LAMOTRIGINE 100 MG PO TABS: 100.0000 mg | ORAL_TABLET | Freq: Two times a day (BID) | ORAL | 0 refills | Status: DC

## 2023-10-05 NOTE — Progress Notes (Signed)
 [facility] MD Virtual Progress Note   Patient Location: Home Provider Location: Home Office  I connect with patient by video and verified that I am speaking with correct person by using two identifiers. I discussed the limitations of evaluation and management by telemedicine and the availability of in person appointments. I also discussed with the patient that there may be a patient responsible charge related to this service. The patient expressed understanding and agreed to proceed.  Melissa Ochoa [id number] 58 y.o.  10/05/2023 10:20 AM  History of Present Illness:  Patient is 58 year old Caucasian, married female who was seen first time more than 2 months ago.  She is a patient of Dr. Brutus who had left the practice.  She was taking moderate dose of Adderall, Xanax  and also taking Seroquel .  She has bipolar disorder, ADHD and generalized anxiety disorder.  She also has hypertension and [facility].  She was taking 2 antihypertensive medication.  We have cut down her Adderall because of high blood pressure readings as per EMR.  She reported things are going well and she had good Christmas with the family.  Patient has 2 grandchild's and 2 daughters.  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  Her primary care is Dr. Loreli at [facility].  She reported her mania is stable and she has not taken extra 50 mg of Seroquel  in a while.  She reported her attention, concentration is good.  She is checking her blood pressure and noticed her blood pressure has been much better and came down and wondering if one of the medication may need to stop or lower the dose.  She has planned to discuss with her doctor on the next visit.  She denies any major panic attack.  We have recommended to consider Vyvanse  is a long-acting and a better choice for her ADHD symptoms.  However she forgot  to do research on it.  I discussed in detail about having a psychological testing to establish ADHD as we do not have any records.  Patient told that she had testing by Ozell Kass many years ago and then it was switched to Dr. Brutus who continued his stimulants.  I encourage reach out to previous provider who had done psychological testing and if they do not have it then we may need to refer for new testing.  Patient also believe her primary care may have the results and she will reach out to them.  She denies drinking or using any illegal substances.  She had lost weight overall in 1 year and she feel much better taking her medication.  She is very reluctant to cut down further Adderall as she feels medicine needed for her daily functioning.  She denies any hallucination, paranoia, suicidal thoughts.  She does not recall any major panic attack since the last visit.  She has no tremors, shakes.  Past Psychiatric History: History of bipolar disorder, anxiety, ADHD.  Tried Klonopin and Paxil but did not like.  Saw Dr. Verba and Brutus.  No history of suicidal attempt, psychosis but history of mania.     Outpatient Encounter Medications as of 10/05/2023  Medication Sig   ALPRAZolam  (XANAX ) 1 MG tablet Take 1 tablet (1 mg total) by mouth 4 (four) times daily as needed for anxiety. Bridge to January appt   amphetamine -dextroamphetamine  (ADDERALL) 20 MG tablet Take 1 tablet (20  mg total) by mouth 2 (two) times daily with a meal.   cyclobenzaprine  (FLEXERIL ) 10 MG tablet Take 10 mg by mouth every 8 (eight) hours as needed for muscle spasms.   docusate sodium  (COLACE) 100 MG capsule Take 1 capsule (100 mg total) by mouth 2 (two) times daily.   labetalol  (NORMODYNE ) 300 MG tablet Take 600 mg by mouth 3 (three) times daily.   labetalol  (NORMODYNE ) 300 MG tablet Take 3 tablets by mouth 2 (two) times daily.   lamoTRIgine  (LAMICTAL ) 100 MG tablet Take 1 tablet (100 mg total) by mouth 2 (two) times daily.    losartan  (COZAAR ) 50 MG tablet Take 50 mg by mouth daily.    meloxicam (MOBIC) 15 MG tablet Take 15 mg by mouth daily as needed.   QUEtiapine  (SEROQUEL ) 300 MG tablet Take 1 tablet (300 mg total) by mouth at bedtime.   QUEtiapine  (SEROQUEL ) 50 MG tablet TAKE 1 TABLET(50 MG) BY MOUTH DAILY   sertraline  (ZOLOFT ) 100 MG tablet Take 1 tablet (100 mg total) by mouth in the morning and at bedtime.   No facility-administered encounter medications on file as of 10/05/2023.    No results found for this or any previous visit (from the past 2160 hours).   Psychiatric Specialty Exam: Physical Exam  Review of Systems  Weight 180 lb (81.6 kg).There is no height or weight on file to calculate BMI.  General Appearance: Casual  Eye Contact:  Good  Speech:  Clear and Coherent  Volume:  Normal  Mood:  Euthymic  Affect:  Appropriate  Thought Process:  Goal Directed  Orientation:  Full (Time, Place, and Person)  Thought Content:  Logical  Suicidal Thoughts:  No  Homicidal Thoughts:  No  Memory:  Immediate;   Good Recent;   Good Remote;   Good  Judgement:  Intact  Insight:  Present  Psychomotor Activity:  Normal  Concentration:  Concentration: Good and Attention Span: Good  Recall:  Good  Fund of Knowledge:  Good  Language:  Good  Akathisia:  No  Handed:  Right  AIMS (if indicated):     Assets:  Communication Skills Desire for Improvement Housing Resilience Social Support Transportation  ADL's:  Intact  Cognition:  WNL  Sleep:  ok     Assessment/Plan: Bipolar I disorder, most recent episode depressed (HCC) - Plan: lamoTRIgine  (LAMICTAL ) 100 MG tablet, QUEtiapine  (SEROQUEL ) 300 MG tablet  Attention deficit hyperactivity disorder (ADHD), predominantly inattentive type - Plan: amphetamine -dextroamphetamine  (ADDERALL) 20 MG tablet  GAD (generalized anxiety disorder) - Plan: ALPRAZolam  (XANAX ) 1 MG tablet, QUEtiapine  (SEROQUEL ) 300 MG tablet  Discussed current medication and taking  higher dose of Xanax  and moderate dose of Adderall.  Discussed having good psychological testing to establish ADHD.  Patient recalled it was done by previous provider but we do not have any results.  She agreed to give them a call and also she will call primary care Dr. Loreli at [facility] if they have any testing results.  She also agreed if could not find the results then may need to have a new testing and we will refer her for places.  Since Adderall does cut down she noticed her blood pressure is better and lower and wondering if she may need to stop or reduce the dose of second hypertensive.  She has appointment coming up at [facility] who manages her blood pressure.  I also discussed since her anxiety is not as bad she may consider lowering her Xanax  and  take only 1 mg 3 times a day.  We discussed stimulants, benzodiazepine dependency, tolerance, withdrawal.  Discussed hospital policy and stent of care requires adequate testing for these medications.  Patient acknowledged and agreed.  We will continue Seroquel  300 mg at bedtime, Lamictal  100 mg twice a day, Adderall for now 20 mg twice a day and Xanax  reduced to 1 mg 3 times a day.  Encouraged to call us  back if she is any question or any concern.  We will follow-up in 2 months.  Emphasized to contact her provider for the management of hypertension.   Follow Up Instructions:     I discussed the assessment and treatment plan with the patient. The patient was provided an opportunity to ask questions and all were answered. The patient agreed with the plan and demonstrated an understanding of the instructions.   The patient was advised to call back or seek an in-person evaluation if the symptoms worsen or if the condition fails to improve as anticipated.    Collaboration of Care: Other provider involved in patient's care AEB notes are available in epic to review  Patient/Guardian was advised Release of Information must be  obtained prior to any record release in order to collaborate their care with an outside provider. Patient/Guardian was advised if they have not already done so to contact the registration department to sign all necessary forms in order for us  to release information regarding their care.   Consent: Patient/Guardian gives verbal consent for treatment and assignment of benefits for services provided during this visit. Patient/Guardian expressed understanding and agreed to proceed.     I provided 28 minutes of non face to face time during this encounter.  Note: This document was prepared by [employer] voice dictation technology and any errors that results from this process are unintentional.    Leni ONEIDA Client, MD 10/05/2023

## 2023-10-08 ENCOUNTER — Telehealth (HOSPITAL_COMMUNITY): Payer: Self-pay | Admitting: *Deleted

## 2023-10-08 NOTE — Telephone Encounter (Signed)
 I have LVM with PCP medical records requesting recent labs and med list. I also spoke with [facility] with same request but have not received anything yet. Will continue to f/u. FYI.

## 2023-10-27 DIAGNOSIS — N182 Chronic kidney disease, stage 2 (mild): Secondary | ICD-10-CM | POA: Diagnosis not present

## 2023-11-02 ENCOUNTER — Telehealth (HOSPITAL_COMMUNITY): Payer: Self-pay

## 2023-11-02 DIAGNOSIS — F900 Attention-deficit hyperactivity disorder, predominantly inattentive type: Secondary | ICD-10-CM

## 2023-11-02 NOTE — Telephone Encounter (Signed)
Patient is calling about her Adderall prescription, she is not due to have it filled until tomorrow, she is just being proactive. Last filled on 1/7 and patient has a follow up on 3/5. Pharmacy in the chart is correct

## 2023-11-03 MED ORDER — AMPHETAMINE-DEXTROAMPHETAMINE 20 MG PO TABS: 20.0000 mg | ORAL_TABLET | Freq: Two times a day (BID) | ORAL | 0 refills | Status: DC

## 2023-11-03 NOTE — Telephone Encounter (Addendum)
 Sent new prescription that she can pick up on her due date but she need to follow-up for ADHD testing.

## 2023-11-29 DIAGNOSIS — K080 Exfoliation of teeth due to systemic causes: Secondary | ICD-10-CM | POA: Diagnosis not present

## 2023-12-01 ENCOUNTER — Encounter (HOSPITAL_COMMUNITY): Payer: Self-pay | Admitting: Psychiatry

## 2023-12-01 ENCOUNTER — Telehealth (HOSPITAL_COMMUNITY): Payer: Medicare Other | Admitting: Psychiatry

## 2023-12-01 VITALS — BP 134/80 | Wt 180.0 lb

## 2023-12-01 DIAGNOSIS — F411 Generalized anxiety disorder: Secondary | ICD-10-CM

## 2023-12-01 DIAGNOSIS — F3130 Bipolar disorder, current episode depressed, mild or moderate severity, unspecified: Secondary | ICD-10-CM

## 2023-12-01 DIAGNOSIS — F900 Attention-deficit hyperactivity disorder, predominantly inattentive type: Secondary | ICD-10-CM | POA: Diagnosis not present

## 2023-12-01 MED ORDER — ALPRAZOLAM 1 MG PO TABS
1.0000 mg | ORAL_TABLET | Freq: Three times a day (TID) | ORAL | 1 refills | Status: DC | PRN
Start: 2023-12-01 — End: 2023-12-30

## 2023-12-01 MED ORDER — SERTRALINE HCL 100 MG PO TABS: 100.0000 mg | ORAL_TABLET | Freq: Two times a day (BID) | ORAL | 1 refills | Status: DC

## 2023-12-01 MED ORDER — QUETIAPINE FUMARATE 300 MG PO TABS: 300.0000 mg | ORAL_TABLET | Freq: Every day | ORAL | 1 refills | Status: DC

## 2023-12-01 MED ORDER — LAMOTRIGINE 100 MG PO TABS: 100.0000 mg | ORAL_TABLET | Freq: Two times a day (BID) | ORAL | 0 refills | Status: DC

## 2023-12-01 MED ORDER — AMPHETAMINE-DEXTROAMPHETAMINE 15 MG PO TABS
15.0000 mg | ORAL_TABLET | Freq: Two times a day (BID) | ORAL | 0 refills | Status: DC
Start: 2023-12-01 — End: 2024-01-31

## 2023-12-01 NOTE — Progress Notes (Signed)
 [facility] MD Virtual Progress Note   Patient Location: Home Provider Location: Home Office  I connect with patient by video and verified that I am speaking with correct person by using two identifiers. I discussed the limitations of evaluation and management by telemedicine and the availability of in person appointments. I also discussed with the patient that there may be a patient responsible charge related to this service. The patient expressed understanding and agreed to proceed.  Melissa Ochoa [id number] 58 y.o.  12/01/2023 10:24 AM  History of Present Illness:  Patient is evaluated by video session.  She is taking Seroquel, Adderall, Xanax Lamictal and Zoloft.  Patient is without Zoloft for past 10 days as could not get the refills from the pharmacy.  Her Zoloft was written by Dr. Michae Kava more than 6 months ago and I did not receive any refill request from the pharmacy.  She reported since not taking the Zoloft her anxiety is slightly increased.  She reported current medicine is "working however I reminded that we need a psychological testing for ADHD and recent blood work results.  We have contacted her primary care Dr. Sherryll Burger at [facility] and [facility] for [facility] labs but have not received any documentation.  Patient told that she has diagnostic code for ADHD which she received from primary care.  Explained we need the actual testing results and if they do not have it then we may have to refer for actual testing to be done.  I explained without results it would be difficult to prescribe stimulants in the future.  Patient promised to look into it and she will get back to [place].  She is going to [place] in first week of April.  She like to get few days earlier medication.  She sleeps good.  She checks her blood pressure regularly and lately readings are much better.  Her appetite is okay.  Her sleep is good.  We have also discussed switching  to Vyvanse and patient did research and explain the possible side effects with Vyvanse which is exactly can happen with Adderall.  Patient insist to keep the Adderall for now.  He is also taking Xanax 1 mg 3 times a day.  We discussed polypharmacy.  She denies drinking or using any illegal substances.  She denies any hallucination, paranoia, suicidal thoughts.  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  She has 2 daughter and 2 granddaughter.  She reported medicine helps her attention, concentration.  She denies any mania or psychosis as Seroquel helps her mood.  Past Psychiatric History: History of bipolar disorder, anxiety, ADHD.  Tried Klonopin and Paxil but did not like.  Saw Dr. Earnstine Regal and Michae Kava.  No history of suicidal attempt, psychosis but history of mania.     Outpatient Encounter Medications as of 12/01/2023  Medication Sig   ALPRAZolam (XANAX) 1 MG tablet Take 1 tablet (1 mg total) by mouth 3 (three) times daily as needed for anxiety.   amphetamine-dextroamphetamine (ADDERALL) 20 MG tablet Take 1 tablet (20 mg total) by mouth 2 (two) times daily with a meal.   cyclobenzaprine (FLEXERIL) 10 MG tablet Take 10 mg by mouth every 8 (eight) hours as needed for muscle spasms.   docusate sodium (COLACE) 100 MG capsule Take 1 capsule (100 mg total) by mouth 2 (two) times daily.   labetalol (NORMODYNE) 300 MG tablet Take 600 mg by mouth 3 (three) times  daily.   labetalol (NORMODYNE) 300 MG tablet Take 3 tablets by mouth 2 (two) times daily.   lamoTRIgine (LAMICTAL) 100 MG tablet Take 1 tablet (100 mg total) by mouth 2 (two) times daily.   losartan (COZAAR) 50 MG tablet Take 50 mg by mouth daily.    meloxicam (MOBIC) 15 MG tablet Take 15 mg by mouth daily as needed.   QUEtiapine (SEROQUEL) 300 MG tablet Take 1 tablet (300 mg total) by mouth at bedtime.   QUEtiapine (SEROQUEL) 50 MG tablet TAKE 1 TABLET(50 MG) BY MOUTH DAILY   sertraline (ZOLOFT) 100 MG  tablet Take 1 tablet (100 mg total) by mouth in the morning and at bedtime.   No facility-administered encounter medications on file as of 12/01/2023.    No results found for this or any previous visit (from the past 2160 hours).   Psychiatric Specialty Exam: Physical Exam  Review of Systems  Blood pressure 134/80, weight 180 lb (81.6 kg).There is no height or weight on file to calculate BMI.  General Appearance: Casual  Eye Contact:  Good  Speech:  Clear and Coherent  Volume:  Normal  Mood:  Euthymic  Affect:  Appropriate  Thought Process:  Goal Directed  Orientation:  Full (Time, Place, and Person)  Thought Content:  Logical  Suicidal Thoughts:  No  Homicidal Thoughts:  No  Memory:  Immediate;   Good Recent;   Good Remote;   Good  Judgement:  Intact  Insight:  Present  Psychomotor Activity:  Normal  Concentration:  Concentration: Good and Attention Span: Good  Recall:  Good  Fund of Knowledge:  Good  Language:  Good  Akathisia:  No  Handed:  Right  AIMS (if indicated):     Assets:  Communication Skills Desire for Improvement Housing Social Support Transportation  ADL's:  Intact  Cognition:  WNL  Sleep:  ok     Assessment/Plan: Bipolar I disorder, most recent episode depressed (HCC) - Plan: lamoTRIgine (LAMICTAL) 100 MG tablet, QUEtiapine (SEROQUEL) 300 MG tablet, sertraline (ZOLOFT) 100 MG tablet  GAD (generalized anxiety disorder) - Plan: ALPRAZolam (XANAX) 1 MG tablet, QUEtiapine (SEROQUEL) 300 MG tablet, sertraline (ZOLOFT) 100 MG tablet  Attention deficit hyperactivity disorder (ADHD), predominantly inattentive type - Plan: amphetamine-dextroamphetamine (ADDERALL) 15 MG tablet  Reviewed current medication and discussed controlled substance abuse, dependency, tolerance and withdrawal.  Emphasized that we must get the psychological testing to continue to provide stimulants.  Other option is to switch to nonstimulant.  Patient will call her primary care and if no  results available then she will be referred to ADHD testing.  She will also need blood work results as [facility] we will manage her blood pressure medication.  For now keep the Xanax 1 mg up to 3 times a day for severe anxiety, continue Seroquel 300 mg at bedtime and Lamictal 100 mg twice a day to help her bipolar disorder and Zoloft 100 mg twice a day for anxiety.  I will reduce Adderall down to 15 mg twice a day however in the future we will require testing.  Follow-up in 2 months.  Patient going to [place] in first week of April and like to get earlier refills.   Follow Up Instructions:     I discussed the assessment and treatment plan with the patient. The patient was provided an opportunity to ask questions and all were answered. The patient agreed with the plan and demonstrated an understanding of the instructions.   The  patient was advised to call back or seek an in-person evaluation if the symptoms worsen or if the condition fails to improve as anticipated.    Collaboration of Care: Other provider involved in patient's care AEB notes are available in epic to review  Patient/Guardian was advised Release of Information must be obtained prior to any record release in order to collaborate their care with an outside provider. Patient/Guardian was advised if they have not already done so to contact the registration department to sign all necessary forms in order for [place] to release information regarding their care.   Consent: Patient/Guardian gives verbal consent for treatment and assignment of benefits for services provided during this visit. Patient/Guardian expressed understanding and agreed to proceed.     I provided 31 minutes of non face to face time during this encounter.  Note: This document was prepared by [employer] voice dictation technology and any errors that results from this process are unintentional.    Cleotis Nipper, MD 12/01/2023

## 2023-12-02 ENCOUNTER — Encounter (HOSPITAL_COMMUNITY): Payer: Self-pay

## 2023-12-02 ENCOUNTER — Telehealth (HOSPITAL_COMMUNITY): Payer: Self-pay

## 2023-12-02 DIAGNOSIS — F900 Attention-deficit hyperactivity disorder, predominantly inattentive type: Secondary | ICD-10-CM

## 2023-12-02 MED ORDER — AMPHETAMINE-DEXTROAMPHETAMINE 15 MG PO TABS: 15.0000 mg | ORAL_TABLET | Freq: Two times a day (BID) | ORAL | 0 refills | Status: DC

## 2023-12-02 NOTE — Telephone Encounter (Signed)
 Patient left a voicemail stating that you sent the refill for Adderall and it has a fill date of 4/4. Patient wants to know if you are going to fill it for this month as well. She was very upset and yelling. Please review and advise, thank you

## 2023-12-02 NOTE — Addendum Note (Signed)
 Addended by: Kathryne Sharper T on: 12/02/2023 02:11 PM   Modules accepted: Orders

## 2023-12-02 NOTE — Telephone Encounter (Signed)
 I sent the prescription to the pharmacy.  She should not be getting upset because she was not due.  She can pick up the prescription today.

## 2023-12-02 NOTE — Telephone Encounter (Signed)
 I called patient back and let her know that her prescription for this month is at the pharmacy

## 2023-12-03 ENCOUNTER — Other Ambulatory Visit (HOSPITAL_COMMUNITY): Payer: Self-pay | Admitting: Nurse Practitioner

## 2023-12-03 DIAGNOSIS — R109 Unspecified abdominal pain: Secondary | ICD-10-CM

## 2023-12-03 LAB — COMPREHENSIVE METABOLIC PANEL (CC13): EGFR: 64.1

## 2023-12-06 ENCOUNTER — Ambulatory Visit
Admission: RE | Admit: 2023-12-06 | Discharge: 2023-12-06 | Disposition: A | Discharge status: Home / Self Care | Source: Ambulatory Visit | Attending: Nurse Practitioner | Admitting: Nurse Practitioner

## 2023-12-06 DIAGNOSIS — R109 Unspecified abdominal pain: Secondary | ICD-10-CM | POA: Diagnosis not present

## 2023-12-06 DIAGNOSIS — K429 Umbilical hernia without obstruction or gangrene: Secondary | ICD-10-CM | POA: Diagnosis not present

## 2023-12-06 DIAGNOSIS — D1771 Benign lipomatous neoplasm of kidney: Secondary | ICD-10-CM | POA: Diagnosis not present

## 2023-12-06 DIAGNOSIS — N2889 Other specified disorders of kidney and ureter: Secondary | ICD-10-CM | POA: Diagnosis not present

## 2023-12-29 ENCOUNTER — Telehealth (HOSPITAL_COMMUNITY): Payer: Self-pay

## 2023-12-29 DIAGNOSIS — F411 Generalized anxiety disorder: Secondary | ICD-10-CM

## 2023-12-29 NOTE — Telephone Encounter (Signed)
 Patient is calling to request an early fill on her Xanax. Patient is going out of the country and all of he medications are being refilled on Friday except the Xanax, it is due to be refilled on the 10th, patient will not be back in the country until the 17th- leaving her 7 days without the Xanax. She wants to know if you will do a one time early fill so she can get all her medications on Friday. Please review and advise, thank you

## 2023-12-30 MED ORDER — ALPRAZOLAM 1 MG PO TABS: 1.0000 mg | ORAL_TABLET | Freq: Three times a day (TID) | ORAL | 0 refills | Status: DC | PRN

## 2023-12-30 NOTE — Telephone Encounter (Signed)
 Send to pharmacy. She may ave to pay out of her pocket.

## 2023-12-31 NOTE — Telephone Encounter (Signed)
 Called the patient and left a voicemail letting her know - I also advised the pharmacy

## 2024-01-27 ENCOUNTER — Other Ambulatory Visit (HOSPITAL_COMMUNITY): Payer: Self-pay | Admitting: Psychiatry

## 2024-01-27 DIAGNOSIS — F411 Generalized anxiety disorder: Secondary | ICD-10-CM

## 2024-01-27 DIAGNOSIS — F3130 Bipolar disorder, current episode depressed, mild or moderate severity, unspecified: Secondary | ICD-10-CM

## 2024-01-31 ENCOUNTER — Telehealth (HOSPITAL_COMMUNITY): Payer: Self-pay

## 2024-01-31 DIAGNOSIS — F900 Attention-deficit hyperactivity disorder, predominantly inattentive type: Secondary | ICD-10-CM

## 2024-01-31 MED ORDER — AMPHETAMINE-DEXTROAMPHETAMINE 15 MG PO TABS
15.0000 mg | ORAL_TABLET | Freq: Two times a day (BID) | ORAL | 0 refills | Status: DC
Start: 2024-01-31 — End: 2024-07-26

## 2024-01-31 NOTE — Telephone Encounter (Signed)
 I have sent the prescription however she must do the psychological testing but she is promised in the past.  I have discussed that if you do not have psychological testing for ADHD then option is to provide nonstimulants.

## 2024-01-31 NOTE — Telephone Encounter (Signed)
 Patient is calling for a refill on her Adderall - she currently has an appointment this Thursday, but patient states she will have her Grandson and needs to reschedule her appointment - this is the only medication that will need a refill. Please review and advise, thank you

## 2024-02-03 ENCOUNTER — Ambulatory Visit (HOSPITAL_COMMUNITY): Admitting: Psychiatry

## 2024-02-17 ENCOUNTER — Other Ambulatory Visit (HOSPITAL_COMMUNITY): Payer: Self-pay

## 2024-02-17 DIAGNOSIS — F411 Generalized anxiety disorder: Secondary | ICD-10-CM

## 2024-02-17 DIAGNOSIS — F3130 Bipolar disorder, current episode depressed, mild or moderate severity, unspecified: Secondary | ICD-10-CM

## 2024-02-17 MED ORDER — SERTRALINE HCL 100 MG PO TABS
100.0000 mg | ORAL_TABLET | Freq: Two times a day (BID) | ORAL | 0 refills | Status: DC
Start: 2024-02-17 — End: 2024-02-24

## 2024-02-21 ENCOUNTER — Other Ambulatory Visit (HOSPITAL_COMMUNITY): Payer: Self-pay | Admitting: Psychiatry

## 2024-02-21 DIAGNOSIS — F411 Generalized anxiety disorder: Secondary | ICD-10-CM

## 2024-02-21 DIAGNOSIS — F3130 Bipolar disorder, current episode depressed, mild or moderate severity, unspecified: Secondary | ICD-10-CM

## 2024-02-24 ENCOUNTER — Other Ambulatory Visit: Payer: Self-pay

## 2024-02-24 ENCOUNTER — Encounter (HOSPITAL_COMMUNITY): Payer: Self-pay | Admitting: Psychiatry

## 2024-02-24 ENCOUNTER — Ambulatory Visit (HOSPITAL_COMMUNITY): Admitting: Psychiatry

## 2024-02-24 VITALS — BP 155/91 | HR 78 | Ht 68.0 in | Wt 199.0 lb

## 2024-02-24 DIAGNOSIS — F3130 Bipolar disorder, current episode depressed, mild or moderate severity, unspecified: Secondary | ICD-10-CM

## 2024-02-24 DIAGNOSIS — F900 Attention-deficit hyperactivity disorder, predominantly inattentive type: Secondary | ICD-10-CM | POA: Diagnosis not present

## 2024-02-24 DIAGNOSIS — F411 Generalized anxiety disorder: Secondary | ICD-10-CM

## 2024-02-24 MED ORDER — SERTRALINE HCL 100 MG PO TABS: 100.0000 mg | ORAL_TABLET | Freq: Two times a day (BID) | ORAL | 1 refills | Status: DC

## 2024-02-24 MED ORDER — LAMOTRIGINE 100 MG PO TABS: 100.0000 mg | ORAL_TABLET | Freq: Two times a day (BID) | ORAL | 0 refills | Status: DC

## 2024-02-24 MED ORDER — ATOMOXETINE HCL 25 MG PO CAPS: 25.0000 mg | ORAL_CAPSULE | Freq: Two times a day (BID) | ORAL | 1 refills | Status: DC

## 2024-02-24 MED ORDER — QUETIAPINE FUMARATE 300 MG PO TABS: 300.0000 mg | ORAL_TABLET | Freq: Every day | ORAL | 1 refills | Status: DC

## 2024-02-24 MED ORDER — ALPRAZOLAM 1 MG PO TABS: 1.0000 mg | ORAL_TABLET | Freq: Three times a day (TID) | ORAL | 1 refills | Status: DC | PRN

## 2024-02-24 NOTE — Progress Notes (Signed)
 [facility] MD/PA/NP OP Progress Note  02/24/2024 3:28 PM Melissa Ochoa  MRN:  [id number]  Chief Complaint:  Chief Complaint  Patient presents with   Follow-up   Medication Refill   HPI: Patient came today for her follow-up appointment.  She recently had a visit to [place] which she really enjoyed with the family.  Patient told she brought some old papers from her primary care which shows that she has taken the Vyvanse  60 mg in the past and it did work but she could not afford.  She is taking Xanax , Seroquel , Zoloft , Lamictal .  We have provided Adderall however reminded that she needed psychological testing in order to provide stimulants.  We also recommended to have blood work from her primary care.  We received only CBC which was done in January but no other lab results.  Patient was told she has chronic kidney issues and she has renal angiogram but do not have any details available.  Overall she feels things are going very well.  She sleeps good.  She denies any irritability, anger, agitation, crying spells or any feeling of hopelessness or worthlessness patient denies drinking or using any illegal substances.  Her appetite is okay.  Her weight is stable.  She is not working and she enjoys traveling.  Her future plan is to visit [place] and other places.  She denies any mania, psychosis.  She used to take Seroquel  50 mg during the day which she had stopped.  Patient told her cardiologist also trying to lower the blood pressure medication since her blood pressure is much better.  Visit Diagnosis:    ICD-10-CM   1. Attention deficit hyperactivity disorder (ADHD), predominantly inattentive type  F90.0 atomoxetine (STRATTERA) 25 MG capsule    2. GAD (generalized anxiety disorder)  F41.1 QUEtiapine  (SEROQUEL ) 300 MG tablet    sertraline  (ZOLOFT ) 100 MG tablet    ALPRAZolam  (XANAX ) 1 MG tablet    atomoxetine (STRATTERA) 25 MG capsule    3. Bipolar I disorder, most recent episode depressed (HCC)  F31.30  QUEtiapine  (SEROQUEL ) 300 MG tablet    sertraline  (ZOLOFT ) 100 MG tablet    lamoTRIgine  (LAMICTAL ) 100 MG tablet    atomoxetine (STRATTERA) 25 MG capsule      Past Psychiatric History: Reviewed H/O bipolar disorder, anxiety, ADHD.  Tried Klonopin and Paxil but did not like.  Saw Dr. Eldridge Greig and Katrine Parody.  No history of suicidal attempt, psychosis but history of mania.    Past Medical History:  Past Medical History:  Diagnosis Date   ADHD (attention deficit hyperactivity disorder)    Anxiety    Bipolar disorder (HCC)    Cervical disc disease    Colon polyps    adenomatous   Depression    Hypertension    Iron deficiency    Kidney disease    renal angiomyolipomas   Liver cyst    Melanosis    Pneumonia    Tuberous sclerosis (HCC)     Past Surgical History:  Procedure Laterality Date   COLONOSCOPY W/ ENDOSCOPIC US      IR EMBO TUMOR ORGAN ISCHEMIA INFARCT INC GUIDE ROADMAPPING  05/28/2022   IR RADIOLOGIST EVAL & MGMT  02/24/2022   IR RADIOLOGIST EVAL & MGMT  07/21/2022   IR RENAL SUPRASEL UNI S&I MOD SED  03/23/2022   IR RENAL SUPRASEL UNI S&I MOD SED  05/28/2022   IR US  GUIDE VASC ACCESS RIGHT  03/23/2022   IR US  GUIDE VASC ACCESS RIGHT  05/28/2022  KNEE ARTHROSCOPY WITH DRILLING/MICROFRACTURE Right 09/27/2018   Procedure: Right knee arthroscopy with partial lateral menisectomy;  Surgeon: Hazle Lites, MD;  Location: [facility];  Service: Orthopedics;  Laterality: Right;   NEPHRECTOMY Right    RENAL ARTERY EMBOLIZATION     2 procdures in the left , 4 in the right     TOTAL KNEE ARTHROPLASTY Right 03/27/2019   Procedure: TOTAL KNEE ARTHROPLASTY;  Surgeon: Liliane Rei, MD;  Location: [facility];  Service: Orthopedics;  Laterality: Right;     Family Psychiatric History: Reviewed  Family History:  Family History  Problem Relation Age of Onset   Anxiety disorder Mother    ADD / ADHD Mother    Depression Mother    Lung cancer Father        smoker   Bipolar disorder Daughter     Bipolar disorder Other    Diabetes Other        a lot of relatives   Suicidality Neg Hx     Social History:  Social History   Socioeconomic History   Marital status: Married    Spouse name: Not on file   Number of children: Not on file   Years of education: Not on file   Highest education level: Not on file  Occupational History   Not on file  Tobacco Use   Smoking status: Never   Smokeless tobacco: Never  Vaping Use   Vaping status: Never Used  Substance and Sexual Activity   Alcohol use: No    Alcohol/week: 0.0 standard drinks of alcohol   Drug use: No   Sexual activity: Yes    Partners: Male    Birth control/protection: None  Other Topics Concern   Not on file  Social History Narrative   Not on file   Social Drivers of Health   Financial Resource Strain: Not on file  Food Insecurity: Not on file  Transportation Needs: Not on file  Physical Activity: Not on file  Stress: Not on file  Social Connections: Not on file    Allergies: No Known Allergies  Metabolic Disorder Labs: Lab Results  Component Value Date   HGBA1C 5.8 (H) 11/11/2022   Lab Results  Component Value Date   PROLACTIN 9.7 11/11/2022   Lab Results  Component Value Date   CHOL 280 (H) 11/11/2022   TRIG 123 11/11/2022   HDL 74 11/11/2022   CHOLHDL 3.8 11/11/2022   LDLCALC 184 (H) 11/11/2022   Lab Results  Component Value Date   TSH 2.520 11/11/2022   TSH 3.15 10/27/2017    Therapeutic Level Labs: No results found for: "LITHIUM" No results found for: "VALPROATE" No results found for: "CBMZ"  Current Medications: Current Outpatient Medications  Medication Sig Dispense Refill   amphetamine -dextroamphetamine  (ADDERALL) 15 MG tablet Take 1 tablet by mouth 2 (two) times daily with a meal. 60 tablet 0   atomoxetine (STRATTERA) 25 MG capsule Take 1 capsule (25 mg total) by mouth 2 (two) times daily with a meal. 60 capsule 1   labetalol  (NORMODYNE ) 200 MG tablet Take 200 mg by mouth  3 (three) times daily.     losartan  (COZAAR ) 50 MG tablet Take 50 mg by mouth daily.      ALPRAZolam  (XANAX ) 1 MG tablet Take 1 tablet (1 mg total) by mouth 3 (three) times daily as needed for anxiety. 90 tablet 1   lamoTRIgine  (LAMICTAL ) 100 MG tablet Take 1 tablet (100 mg total) by mouth 2 (two) times daily. 180  tablet 0   QUEtiapine  (SEROQUEL ) 300 MG tablet Take 1 tablet (300 mg total) by mouth at bedtime. 30 tablet 1   sertraline  (ZOLOFT ) 100 MG tablet Take 1 tablet (100 mg total) by mouth in the morning and at bedtime. 60 tablet 1   No current facility-administered medications for this visit.     Musculoskeletal: Strength & Muscle Tone: within normal limits Gait & Station: normal Patient leans: N/A  Psychiatric Specialty Exam: Review of Systems  Pulse 78, height 5\' 8"  (1.727 m), weight 199 lb (90.3 kg).Body mass index is 30.26 kg/m.  General Appearance: Casual  Eye Contact:  Good  Speech:  Clear and Coherent and Normal Rate  Volume:  Normal  Mood:  Euthymic  Affect:  Appropriate  Thought Process:  Goal Directed  Orientation:  Full (Time, Place, and Person)  Thought Content: Logical   Suicidal Thoughts:  No  Homicidal Thoughts:  No  Memory:  Immediate;   Good Recent;   Good Remote;   Good  Judgement:  Good  Insight:  Good  Psychomotor Activity:  Normal  Concentration:  Concentration: Good and Attention Span: Good  Recall:  Good  Fund of Knowledge: Good  Language: Good  Akathisia:  No  Handed:  Right  AIMS (if indicated): not done  Assets:  Communication Skills Desire for Improvement Housing Resilience Social Support Talents/Skills Transportation  ADL's:  Intact  Cognition: WNL  Sleep:  Good   Screenings: PHQ2-9    Flowsheet Row [facility] Video Visit from 05/07/2022 in [facility] Video Visit from 02[facility]05/2022 in [facility] Video Visit from 01/01/2022 in [facility] Video Visit from 08/28/2021 in [facility]  PHQ-2 Total Score 4 5 1 4 4   PHQ-9 Total Score 11 16 7 14 10       Flowsheet Row Video Visit from 03/25/2023 in [facility] Admission (Discharged) from IR [facility] from [facility]28/2022 in [facility] [place] [facility] Video Visit from 05/07/2022 in [facility]  C-SSRS RISK CATEGORY No Risk No Risk No Risk        Assessment and Plan: Patient is 58 year old female with history of hypertension, osteoarthritis, renal issues however unclear since we do not have the details came for her follow-up appointment reviewed in detail her medication especially reconciliation as some of the medication she is not taking anymore.  I emphasized we need a lab work from her primary care.  Patient has a blood work coming up next month and she will make sure that we get the results.  I again encouraged to get psychological testing to establish diagnosis of ADHD.  Patient now promised to look into it however in the meantime we will switch to Strattera 25 mg twice a day to help her ADHD symptoms.  Discussed in detail about stimulant versus nonstimulant, side effects and efficacy.  She has no rash or itching.  Continue Lamictal  100 mg twice a day, Zoloft  100 mg twice a day, Xanax  1 mg 3 times a day and Seroquel  300 mg at bedtime.  Patient is not exhibiting any mania psychosis at this time.  Follow-up in 2 months.  Collaboration of Care: Collaboration of Care: Other provider involved in patient's care AEB notes are available in epic to review  Patient/Guardian was advised Release of Information must be obtained prior to any record release in order to  collaborate their care with an outside provider. Patient/Guardian was advised if they have not already done so to  contact the registration department to sign all necessary forms in order for us  to release information regarding their care.   Consent: Patient/Guardian gives verbal consent for treatment and assignment of benefits for services provided during this visit. Patient/Guardian expressed understanding and agreed to proceed.    Arturo Late, MD 02/24/2024, 3:28 PM

## 2024-02-25 ENCOUNTER — Telehealth (HOSPITAL_COMMUNITY): Payer: Self-pay

## 2024-02-25 NOTE — Telephone Encounter (Signed)
 Patient called stating that the atomoxetine (STRATTERA) 25 MG capsule  is going to cost her $99 with her insurance and she is requesting to go back on the Adderall for one month until her testing is done please advise

## 2024-02-25 NOTE — Telephone Encounter (Signed)
 We do psychological testing for the ADHD.  Other option is to contact her pharmacy our office so we can do prior authorization.

## 2024-02-28 ENCOUNTER — Other Ambulatory Visit (HOSPITAL_COMMUNITY): Payer: Self-pay

## 2024-02-28 ENCOUNTER — Telehealth (HOSPITAL_COMMUNITY): Payer: Self-pay

## 2024-02-28 DIAGNOSIS — F411 Generalized anxiety disorder: Secondary | ICD-10-CM

## 2024-02-28 DIAGNOSIS — F900 Attention-deficit hyperactivity disorder, predominantly inattentive type: Secondary | ICD-10-CM

## 2024-02-28 DIAGNOSIS — F3130 Bipolar disorder, current episode depressed, mild or moderate severity, unspecified: Secondary | ICD-10-CM

## 2024-02-28 MED ORDER — ATOMOXETINE HCL 25 MG PO CAPS
25.0000 mg | ORAL_CAPSULE | Freq: Two times a day (BID) | ORAL | 1 refills | Status: DC
Start: 2024-02-28 — End: 2024-05-02

## 2024-02-28 NOTE — Telephone Encounter (Signed)
 Patient called because she went to pick up the Strattera  and for one month it is $99. She can not afford this. Patient states she will go for testing if you refer her and wants to know if you can provide the adder all until she gets tested. Please review and advise, thank you

## 2024-02-29 NOTE — Telephone Encounter (Signed)
 [facility] resent [facility], patient was able to get it for 40 dollars. She states this is still too much but she will try it this month until she can get testing scheduled

## 2024-03-17 ENCOUNTER — Other Ambulatory Visit (HOSPITAL_COMMUNITY): Payer: Self-pay

## 2024-03-17 DIAGNOSIS — F411 Generalized anxiety disorder: Secondary | ICD-10-CM

## 2024-03-17 DIAGNOSIS — F3130 Bipolar disorder, current episode depressed, mild or moderate severity, unspecified: Secondary | ICD-10-CM

## 2024-03-17 MED ORDER — QUETIAPINE FUMARATE 300 MG PO TABS
300.0000 mg | ORAL_TABLET | Freq: Every day | ORAL | 0 refills | Status: DC
Start: 2024-03-17 — End: 2024-05-04

## 2024-03-21 ENCOUNTER — Other Ambulatory Visit (HOSPITAL_COMMUNITY): Payer: Self-pay | Admitting: Psychiatry

## 2024-03-21 DIAGNOSIS — F3130 Bipolar disorder, current episode depressed, mild or moderate severity, unspecified: Secondary | ICD-10-CM

## 2024-03-21 DIAGNOSIS — F900 Attention-deficit hyperactivity disorder, predominantly inattentive type: Secondary | ICD-10-CM

## 2024-03-21 DIAGNOSIS — F411 Generalized anxiety disorder: Secondary | ICD-10-CM

## 2024-03-28 DIAGNOSIS — H0288A Meibomian gland dysfunction right eye, upper and lower eyelids: Secondary | ICD-10-CM | POA: Diagnosis not present

## 2024-03-28 DIAGNOSIS — H02831 Dermatochalasis of right upper eyelid: Secondary | ICD-10-CM | POA: Diagnosis not present

## 2024-03-28 DIAGNOSIS — H524 Presbyopia: Secondary | ICD-10-CM | POA: Diagnosis not present

## 2024-03-28 DIAGNOSIS — H52223 Regular astigmatism, bilateral: Secondary | ICD-10-CM | POA: Diagnosis not present

## 2024-03-28 DIAGNOSIS — H35371 Puckering of macula, right eye: Secondary | ICD-10-CM | POA: Diagnosis not present

## 2024-03-28 DIAGNOSIS — H02834 Dermatochalasis of left upper eyelid: Secondary | ICD-10-CM | POA: Diagnosis not present

## 2024-03-28 DIAGNOSIS — H16223 Keratoconjunctivitis sicca, not specified as Sjogren's, bilateral: Secondary | ICD-10-CM | POA: Diagnosis not present

## 2024-03-28 DIAGNOSIS — H43813 Vitreous degeneration, bilateral: Secondary | ICD-10-CM | POA: Diagnosis not present

## 2024-03-28 DIAGNOSIS — H0288B Meibomian gland dysfunction left eye, upper and lower eyelids: Secondary | ICD-10-CM | POA: Diagnosis not present

## 2024-03-28 DIAGNOSIS — H5213 Myopia, bilateral: Secondary | ICD-10-CM | POA: Diagnosis not present

## 2024-04-17 ENCOUNTER — Telehealth (HOSPITAL_COMMUNITY): Payer: Self-pay

## 2024-04-17 NOTE — Telephone Encounter (Signed)
 Patient was contacted by [facility] and completed all of their paperwork and they told her that her insurance does not cover testing for ADHD, they quoted her $700. Patient states she cannot afford this and that the medication she is currently taking is not working. Please review and advise, thank you

## 2024-04-17 NOTE — Telephone Encounter (Signed)
 Unfortunately for about the test cannot provide stimulants.

## 2024-04-22 ENCOUNTER — Other Ambulatory Visit (HOSPITAL_COMMUNITY): Payer: Self-pay | Admitting: Psychiatry

## 2024-04-22 DIAGNOSIS — F3130 Bipolar disorder, current episode depressed, mild or moderate severity, unspecified: Secondary | ICD-10-CM

## 2024-04-22 DIAGNOSIS — F411 Generalized anxiety disorder: Secondary | ICD-10-CM

## 2024-04-22 DIAGNOSIS — F900 Attention-deficit hyperactivity disorder, predominantly inattentive type: Secondary | ICD-10-CM

## 2024-04-24 ENCOUNTER — Other Ambulatory Visit (HOSPITAL_COMMUNITY): Payer: Self-pay | Admitting: Psychiatry

## 2024-04-24 DIAGNOSIS — F3130 Bipolar disorder, current episode depressed, mild or moderate severity, unspecified: Secondary | ICD-10-CM

## 2024-04-24 DIAGNOSIS — F411 Generalized anxiety disorder: Secondary | ICD-10-CM

## 2024-04-25 DIAGNOSIS — M1712 Unilateral primary osteoarthritis, left knee: Secondary | ICD-10-CM | POA: Diagnosis not present

## 2024-04-25 DIAGNOSIS — N182 Chronic kidney disease, stage 2 (mild): Secondary | ICD-10-CM | POA: Diagnosis not present

## 2024-04-26 ENCOUNTER — Other Ambulatory Visit (HOSPITAL_COMMUNITY): Payer: Self-pay | Admitting: Psychiatry

## 2024-04-26 DIAGNOSIS — F411 Generalized anxiety disorder: Secondary | ICD-10-CM

## 2024-04-26 NOTE — Telephone Encounter (Signed)
 No stimulants without psychological testing

## 2024-04-27 ENCOUNTER — Other Ambulatory Visit (HOSPITAL_COMMUNITY): Payer: Self-pay

## 2024-04-27 ENCOUNTER — Telehealth: Payer: Self-pay | Admitting: Internal Medicine

## 2024-04-27 DIAGNOSIS — F411 Generalized anxiety disorder: Secondary | ICD-10-CM

## 2024-04-27 MED ORDER — ALPRAZOLAM 1 MG PO TABS: 1.0000 mg | ORAL_TABLET | Freq: Three times a day (TID) | ORAL | 0 refills | Status: DC | PRN

## 2024-04-27 NOTE — Telephone Encounter (Signed)
 Reviewed.  Certainly okay for her to transfer to a female provider. Last colonoscopy 2015.  Due for follow-up. Any of our female gastroenterologists would be an excellent choice. You can schedule her with whichever provider has a greatest availability. Thanks, Dr.  Abran

## 2024-04-27 NOTE — Telephone Encounter (Signed)
 Good Morning Dr. Abran,   Patient called stating that she needed to schedule a colonoscopy. Patient states she would like to transfer her care to one of our female providers.   Will you please review and advise on patient transferring her care?  Thank you.

## 2024-04-28 NOTE — Telephone Encounter (Signed)
 Good afternoon Dr. Federico,  Patient called wanting to schedule a colonoscopy and wanted to transfer her care from Dr. Abran to a female provider.    Would you be willing to take patient under your care?  Please review and advise.  Thank you!

## 2024-05-02 ENCOUNTER — Telehealth (HOSPITAL_COMMUNITY): Payer: Self-pay

## 2024-05-02 DIAGNOSIS — F3130 Bipolar disorder, current episode depressed, mild or moderate severity, unspecified: Secondary | ICD-10-CM

## 2024-05-02 DIAGNOSIS — M1712 Unilateral primary osteoarthritis, left knee: Secondary | ICD-10-CM | POA: Diagnosis not present

## 2024-05-02 DIAGNOSIS — F900 Attention-deficit hyperactivity disorder, predominantly inattentive type: Secondary | ICD-10-CM

## 2024-05-02 DIAGNOSIS — F411 Generalized anxiety disorder: Secondary | ICD-10-CM

## 2024-05-02 MED ORDER — ATOMOXETINE HCL 25 MG PO CAPS: 25.0000 mg | ORAL_CAPSULE | Freq: Two times a day (BID) | ORAL | 1 refills | Status: DC

## 2024-05-02 NOTE — Telephone Encounter (Signed)
 Hello,   Patient called today asking for a three day supply of her STRATTERA  . Patient states she ran out and needs meds for Tuesday, Wednesday, and Thursday.    JNL

## 2024-05-02 NOTE — Telephone Encounter (Signed)
 I sent 15-day supply until her next appointment.  Please inform the patient.

## 2024-05-02 NOTE — Addendum Note (Signed)
 Addended by: CURRY PATERSON T on: 05/02/2024 09:13 AM   Modules accepted: Orders

## 2024-05-04 ENCOUNTER — Encounter (HOSPITAL_COMMUNITY): Payer: Self-pay | Admitting: Psychiatry

## 2024-05-04 ENCOUNTER — Telehealth (HOSPITAL_COMMUNITY): Admitting: Psychiatry

## 2024-05-04 DIAGNOSIS — F411 Generalized anxiety disorder: Secondary | ICD-10-CM | POA: Diagnosis not present

## 2024-05-04 DIAGNOSIS — F900 Attention-deficit hyperactivity disorder, predominantly inattentive type: Secondary | ICD-10-CM | POA: Diagnosis not present

## 2024-05-04 DIAGNOSIS — F3130 Bipolar disorder, current episode depressed, mild or moderate severity, unspecified: Secondary | ICD-10-CM

## 2024-05-04 MED ORDER — QUETIAPINE FUMARATE 300 MG PO TABS: 300.0000 mg | ORAL_TABLET | Freq: Every day | ORAL | 0 refills | Status: DC

## 2024-05-04 MED ORDER — SERTRALINE HCL 100 MG PO TABS
100.0000 mg | ORAL_TABLET | Freq: Two times a day (BID) | ORAL | 0 refills | Status: DC
Start: 2024-05-04 — End: 2024-07-26

## 2024-05-04 MED ORDER — ALPRAZOLAM 1 MG PO TABS: 1.0000 mg | ORAL_TABLET | Freq: Three times a day (TID) | ORAL | 2 refills | Status: DC | PRN

## 2024-05-04 MED ORDER — LAMOTRIGINE 100 MG PO TABS: 100.0000 mg | ORAL_TABLET | Freq: Two times a day (BID) | ORAL | 0 refills | Status: DC

## 2024-05-04 MED ORDER — ATOMOXETINE HCL 40 MG PO CAPS: 40.0000 mg | ORAL_CAPSULE | Freq: Two times a day (BID) | ORAL | 0 refills | Status: DC

## 2024-05-04 NOTE — Progress Notes (Signed)
 [facility] MD Virtual Progress Note   Patient Location: Home Provider Location: Office  I connect with patient by video and verified that I am speaking with correct person by using two identifiers. I discussed the limitations of evaluation and management by telemedicine and the availability of in person appointments. I also discussed with the patient that there may be a patient responsible charge related to this service. The patient expressed understanding and agreed to proceed.  Melissa Ochoa [id number] 58 y.o.  05/04/2024 1:33 PM  History of Present Illness:  Patient is evaluated by video session.  She continues to struggle with attention concentration but taking Strattera  25 mg twice a day.  She is not sure if it is working or not.  She really like to go back to Adderall but have not done psychological testing for ADHD.  She is sleeping good.  She noticed lately somewhat impulsive with excesses spending.  She is taking Xanax , Seroquel , Zoloft  and Lamictal .  She reported recently had a visit with primary care Dr. Elsie Gentry at [facility] and she was told labs are normal.  Initially she was told having some kidney issues.  She denies any hopelessness or worthlessness.  She denies any suicidal thoughts.  She denies drinking or using any illegal substances.  Her biggest concern is that not able to do the things which she like to.  Have difficulty multitasking.  So far no major concern from the medication.  Denies any tremors, rash, itching.  Denies any major panic attack.  Patient usually busy on Thursday and Friday as taking care of the grandkids.  Patient told he had a plan to visit [place] in November but do not have details.  She is taking hypertensive medication.  Patient wondering if she can get her ADHD medication from her primary care.  Past Psychiatric History: H/O bipolar disorder, anxiety, ADHD.  Took Adderall but it was discontinued because not  having formal psychological testing.  Tried Klonopin and Paxil but did not like.  Saw Dr. Verba and Brutus.  No history of suicidal attempt, psychosis but history of mania.      Past Medical History:  Diagnosis Date   ADHD (attention deficit hyperactivity disorder)    Anxiety    Bipolar disorder (HCC)    Cervical disc disease    Colon polyps    adenomatous   Depression    Hypertension    Iron deficiency    Kidney disease    renal angiomyolipomas   Liver cyst    Melanosis    Pneumonia    Tuberous sclerosis (HCC)     Outpatient Encounter Medications as of 05/04/2024  Medication Sig   ALPRAZolam  (XANAX ) 1 MG tablet Take 1 tablet (1 mg total) by mouth 3 (three) times daily as needed for anxiety.   amphetamine -dextroamphetamine  (ADDERALL) 15 MG tablet Take 1 tablet by mouth 2 (two) times daily with a meal. (Patient not taking: Reported on 02/24/2024)   atomoxetine  (STRATTERA ) 25 MG capsule Take 1 capsule (25 mg total) by mouth 2 (two) times daily with a meal.   labetalol  (NORMODYNE ) 200 MG tablet Take 200 mg by mouth 3 (three) times daily.   lamoTRIgine  (LAMICTAL ) 100 MG tablet Take 1 tablet (100 mg total) by mouth 2 (two) times daily.   losartan  (COZAAR ) 50 MG tablet Take 50 mg by mouth daily.    QUEtiapine  (SEROQUEL ) 300 MG tablet Take 1 tablet (300 mg total) by mouth at bedtime.   sertraline  (ZOLOFT )  100 MG tablet Take 1 tablet (100 mg total) by mouth in the morning and at bedtime.   No facility-administered encounter medications on file as of 05/04/2024.    No results found for this or any previous visit (from the past 2160 hours).   Psychiatric Specialty Exam: Physical Exam  Review of Systems  Weight 199 lb (90.3 kg).There is no height or weight on file to calculate BMI.  General Appearance: Casual  Eye Contact:  Good  Speech:  Clear and Coherent and Normal Rate  Volume:  Normal  Mood:  Dysphoric  Affect:  Congruent  Thought Process:  Descriptions of Associations: Intact   Orientation:  Full (Time, Place, and Person)  Thought Content:  Rumination  Suicidal Thoughts:  No  Homicidal Thoughts:  No  Memory:  Immediate;   Good Recent;   Good Remote;   Good  Judgement:  Intact  Insight:  Present  Psychomotor Activity:  Normal  Concentration:  Concentration: Good and Attention Span: Fair  Recall:  Good  Fund of Knowledge:  Good  Language:  Good  Akathisia:  No  Handed:  Right  AIMS (if indicated):     Assets:  Communication Skills Desire for Improvement Housing Social Support Transportation  ADL's:  Intact  Cognition:  WNL  Sleep:  pk       03/25/2023    2:17 PM 05/07/2022    1:17 PM 02/05/2022    3:29 PM 01/01/2022   11:07 AM 08/28/2021    1:48 PM  Depression screen PHQ 2/9  Decreased Interest 2 2 0 1 2  Down, Depressed, Hopeless 2 3 1 3 2   PHQ - 2 Score 4 5 1 4 4   Altered sleeping 0 3 0 1 2  Tired, decreased energy 2 3 2 2 1   Change in appetite 2 1 2 2  0  Feeling bad or failure about yourself  3 3 2 3 3   Trouble concentrating 0 1 0 2 0  Moving slowly or fidgety/restless 0 0 0 0 0  Suicidal thoughts 0 0 0 0 0  PHQ-9 Score 11 16 7 14 10   Difficult doing work/chores Very difficult Extremely dIfficult Somewhat difficult Extremely dIfficult Very difficult    Assessment/Plan: GAD (generalized anxiety disorder) - Plan: atomoxetine  (STRATTERA ) 40 MG capsule, QUEtiapine  (SEROQUEL ) 300 MG tablet, sertraline  (ZOLOFT ) 100 MG tablet, ALPRAZolam  (XANAX ) 1 MG tablet  Bipolar I disorder, most recent episode depressed (HCC) - Plan: atomoxetine  (STRATTERA ) 40 MG capsule, lamoTRIgine  (LAMICTAL ) 100 MG tablet, QUEtiapine  (SEROQUEL ) 300 MG tablet, sertraline  (ZOLOFT ) 100 MG tablet  Attention deficit hyperactivity disorder (ADHD), predominantly inattentive type - Plan: atomoxetine  (STRATTERA ) 40 MG capsule  Patient is 58 year old Caucasian female with history of hypertension, osteoarthritis, questionable renal issues, bipolar disorder, generalized anxiety  disorder and taking medication for ADHD.  Once again I emphasized we need to have a psychological testing to establish ADHD diagnosis to provide medication.  Given the history of hypertension we have discontinued stimulant.  Discussed residual symptoms of mood lability.  I offered increasing lamotrigine  but patient refused.  She denies any rash or any itching.  She is taking Xanax  1 mg 3 times a day, Seroquel  300 mg at bedtime, Zoloft  100 mg twice a day and Lamictal  100 mg twice a day.  I discussed at length about other option as she can try higher dose of Strattera  40 mg twice a day.  Patient agreed with the plan but also like to have option to get stimulant from primary care.  I emphasize given the history of hypertension needs to monitor the blood pressure if she ever decided to go back on stimulants.  Once again emphasis given about psychological testing to establish diagnosis.  We will get the records from primary care as patient recently had labs and reported that results are normal.  Will follow-up in 3 months.  [facility].   Follow Up Instructions:     I discussed the assessment and treatment plan with the patient. The patient was provided an opportunity to ask questions and all were answered. The patient agreed with the plan and demonstrated an understanding of the instructions.   The patient was advised to call back or seek an in-person evaluation if the symptoms worsen or if the condition fails to improve as anticipated.    Collaboration of Care: Other provider involved in patient's care AEB notes are available in epic to review  Patient/Guardian was advised Release of Information must be obtained prior to any record release in order to collaborate their care with an outside provider. Patient/Guardian was advised if they have not already done so to contact the registration department to sign all necessary forms in order for us  to release  information regarding their care.   Consent: Patient/Guardian gives verbal consent for treatment and assignment of benefits for services provided during this visit. Patient/Guardian expressed understanding and agreed to proceed.     Total encounter time 28 minutes which includes face-to-face time, chart reviewed, care coordination, order entry and documentation during this encounter.   Note: This document was prepared by [employer] voice dictation technology and any errors that results from this process are unintentional.    Leni ONEIDA Client, MD 05/04/2024

## 2024-05-10 DIAGNOSIS — M1712 Unilateral primary osteoarthritis, left knee: Secondary | ICD-10-CM | POA: Diagnosis not present

## 2024-05-10 NOTE — Telephone Encounter (Signed)
 Lvm for patient to call back to schedule colonoscopy

## 2024-05-11 ENCOUNTER — Ambulatory Visit (HOSPITAL_COMMUNITY): Admitting: Psychiatry

## 2024-05-12 ENCOUNTER — Encounter: Payer: Self-pay | Admitting: Internal Medicine

## 2024-05-16 ENCOUNTER — Telehealth (HOSPITAL_COMMUNITY): Payer: Self-pay | Admitting: *Deleted

## 2024-05-16 NOTE — Telephone Encounter (Signed)
 Results received via fax from [facility].  CMP: Glucose H @ 112 BUN: H @ 25 Urinalysis: WNL  CBC: MPV L @ 5.8 Monocytes %; H @ 14.5  Will scan to chart

## 2024-05-16 NOTE — Telephone Encounter (Signed)
 Thanks

## 2024-05-22 DIAGNOSIS — R739 Hyperglycemia, unspecified: Secondary | ICD-10-CM | POA: Diagnosis not present

## 2024-05-22 DIAGNOSIS — N182 Chronic kidney disease, stage 2 (mild): Secondary | ICD-10-CM | POA: Diagnosis not present

## 2024-05-22 DIAGNOSIS — D631 Anemia in chronic kidney disease: Secondary | ICD-10-CM | POA: Diagnosis not present

## 2024-05-22 DIAGNOSIS — I129 Hypertensive chronic kidney disease with stage 1 through stage 4 chronic kidney disease, or unspecified chronic kidney disease: Secondary | ICD-10-CM | POA: Diagnosis not present

## 2024-05-23 DIAGNOSIS — K080 Exfoliation of teeth due to systemic causes: Secondary | ICD-10-CM | POA: Diagnosis not present

## 2024-05-24 ENCOUNTER — Ambulatory Visit (AMBULATORY_SURGERY_CENTER)

## 2024-05-24 VITALS — Ht 68.0 in | Wt 200.0 lb

## 2024-05-24 DIAGNOSIS — Z860100 Personal history of colon polyps, unspecified: Secondary | ICD-10-CM

## 2024-05-24 MED ORDER — NA SULFATE-K SULFATE-MG SULF 17.5-3.13-1.6 GM/177ML PO SOLN: 1.0000 | Freq: Once | ORAL | 0 refills | Status: AC

## 2024-05-24 NOTE — Progress Notes (Signed)
 No egg or soy allergy known to patient  No issues known to pt with past sedation with any surgeries or procedures Patient denies ever being told they had issues or difficulty with intubation  No FH of Malignant Hyperthermia  Pt on diet pills; Takes [phone] and hold instructions provided  Pt is not on  home 02  Pt is not on blood thinners  Pt has intermittent issues with constipation  No A fib or A flutter Have any cardiac testing pending--No Pt can ambulate-uses a cane at times Pt denies use of chewing tobacco Discussed diabetic I weight loss medication holds Discussed NSAID holds Checked BMI Pt instructed to use [employer] or GoodRx for a price reduction on prep  Patient's chart reviewed by Norleen Schillings CNRA prior to previsit and patient appropriate for the [facility].  Pre visit completed and red dot placed by patient's name on their procedure day (on provider's schedule).

## 2024-05-26 ENCOUNTER — Encounter: Payer: Self-pay | Admitting: Internal Medicine

## 2024-05-27 ENCOUNTER — Other Ambulatory Visit (HOSPITAL_COMMUNITY): Payer: Self-pay | Admitting: Psychiatry

## 2024-05-27 DIAGNOSIS — F3130 Bipolar disorder, current episode depressed, mild or moderate severity, unspecified: Secondary | ICD-10-CM

## 2024-05-27 DIAGNOSIS — F900 Attention-deficit hyperactivity disorder, predominantly inattentive type: Secondary | ICD-10-CM

## 2024-05-27 DIAGNOSIS — F411 Generalized anxiety disorder: Secondary | ICD-10-CM

## 2024-05-31 ENCOUNTER — Telehealth: Payer: Self-pay | Admitting: Internal Medicine

## 2024-05-31 DIAGNOSIS — H02423 Myogenic ptosis of bilateral eyelids: Secondary | ICD-10-CM | POA: Diagnosis not present

## 2024-05-31 DIAGNOSIS — H02834 Dermatochalasis of left upper eyelid: Secondary | ICD-10-CM | POA: Diagnosis not present

## 2024-05-31 DIAGNOSIS — H02831 Dermatochalasis of right upper eyelid: Secondary | ICD-10-CM | POA: Diagnosis not present

## 2024-05-31 NOTE — Telephone Encounter (Signed)
 Good Afternoon Dr. Federico,   I received a call from this patient requesting to cancel her procedure due to her not having transportation and a care partner. Patient was offered [facility] and patient declined to accept there services. Patient was schedule for September the 10 th. Patient stated that she is going to call back at the later time to reschedule. Please advise.   Thank you

## 2024-06-07 ENCOUNTER — Encounter: Admitting: Internal Medicine

## 2024-06-26 ENCOUNTER — Other Ambulatory Visit (HOSPITAL_COMMUNITY): Payer: Self-pay | Admitting: Nephrology

## 2024-06-26 DIAGNOSIS — D179 Benign lipomatous neoplasm, unspecified: Secondary | ICD-10-CM

## 2024-07-11 ENCOUNTER — Ambulatory Visit
Admission: RE | Admit: 2024-07-11 | Discharge: 2024-07-11 | Disposition: A | Discharge status: Home / Self Care | Source: Ambulatory Visit | Attending: Nephrology | Admitting: Nephrology

## 2024-07-11 DIAGNOSIS — D1771 Benign lipomatous neoplasm of kidney: Secondary | ICD-10-CM | POA: Diagnosis not present

## 2024-07-11 DIAGNOSIS — K449 Diaphragmatic hernia without obstruction or gangrene: Secondary | ICD-10-CM | POA: Diagnosis not present

## 2024-07-11 DIAGNOSIS — D179 Benign lipomatous neoplasm, unspecified: Secondary | ICD-10-CM | POA: Diagnosis not present

## 2024-07-11 DIAGNOSIS — N2889 Other specified disorders of kidney and ureter: Secondary | ICD-10-CM | POA: Diagnosis not present

## 2024-07-11 DIAGNOSIS — Z905 Acquired absence of kidney: Secondary | ICD-10-CM | POA: Diagnosis not present

## 2024-07-11 MED ORDER — GADOBUTROL 1 MMOL/ML IV SOLN
9.0000 mL | Freq: Once | INTRAVENOUS | Status: AC | PRN
  Administered 2024-07-11: 9 mL via INTRAVENOUS

## 2024-07-12 ENCOUNTER — Other Ambulatory Visit: Payer: Self-pay | Admitting: Diagnostic Radiology

## 2024-07-12 DIAGNOSIS — N2889 Other specified disorders of kidney and ureter: Secondary | ICD-10-CM

## 2024-07-19 ENCOUNTER — Ambulatory Visit
Admission: RE | Admit: 2024-07-19 | Discharge: 2024-07-19 | Disposition: A | Discharge status: Home / Self Care | Source: Ambulatory Visit | Attending: Diagnostic Radiology | Admitting: Diagnostic Radiology

## 2024-07-19 DIAGNOSIS — D1771 Benign lipomatous neoplasm of kidney: Secondary | ICD-10-CM | POA: Diagnosis not present

## 2024-07-19 DIAGNOSIS — N2889 Other specified disorders of kidney and ureter: Secondary | ICD-10-CM

## 2024-07-19 NOTE — Progress Notes (Signed)
 Chief Complaint: Patient was consulted remotely today (TeleHealth) for follow-up of left renal angiomyolipomas.  Referring Physician(s): Fairy Sellar, MD   History of Present Illness: Melissa Ochoa is a 58 y.o. female with tuberous sclerosis and history of bilateral renal angiomyolipomas.  History of spontaneous renal hemorrhages and the patient had a right nephrectomy in 2011.  Patient also had previous embolizations to the left kidney.  Most recently, the patient had particle embolization to her dominant angiomyolipoma in the left kidney upper pole on 05/28/2022.   I last saw the patient 12/02/2022 and she had an MRI February 2024 demonstrating there was successful infarction of the dominant lesion in the left kidney upper pole.  Patient continued to have multiple lesions larger than 3 cm.  We had discussed doing additional catheter directed particle embolization at that time but she was reluctant to undergo another procedure.  Patient reports having left flank pain in February of this year.  She describes the pain as 3/4 out of 10.  The pain got better on its own.  She did have a noncontrast abdominal CT on 12/06/2023 that did not show any acute abnormalities.  There was no evidence for active bleeding.  She has chronic dull left flank pain that she describes as a 2 out of 10 pain.  Reportedly she has had a history of a microscopic hematuria but no gross hematuria.  Patient is scheduled to take a trip to [place] in 3 weeks and she is concerned that she could have another spontaneous hemorrhage.  Patient had a follow-up MRI on 07/11/2024 that demonstrates slight increase in multiple left renal masses but there is now a 3.7 x 3.3 cm upper pole mass that demonstrates intrinsic T1 hyperintensity that could reflect hemorrhage.  Based on his recent MRI findings, patient is motivated to have additional left renal treatments.    Medical history is significant for recent weight loss, 15 pounds,  since she started taking semaglutide.  Her renal function has been stable and last creatinine was 1.0  She is scheduled to follow-up with nephrology in November.  Past Medical History:  Diagnosis Date   ADHD (attention deficit hyperactivity disorder)    Anxiety    Bipolar disorder (HCC)    Cervical disc disease    Colon polyps    adenomatous   Depression    Hypertension    Iron deficiency    Kidney disease    renal angiomyolipomas   Liver cyst    Melanosis    Pneumonia    Tuberous sclerosis (HCC)     Past Surgical History:  Procedure Laterality Date   COLONOSCOPY W/ ENDOSCOPIC US      IR EMBO TUMOR ORGAN ISCHEMIA INFARCT INC GUIDE ROADMAPPING  05/28/2022   IR RADIOLOGIST EVAL & MGMT  02/24/2022   IR RADIOLOGIST EVAL & MGMT  07/21/2022   IR RENAL SUPRASEL UNI S&I MOD SED  03/23/2022   IR RENAL SUPRASEL UNI S&I MOD SED  05/28/2022   IR US  GUIDE VASC ACCESS RIGHT  03/23/2022   IR US  GUIDE VASC ACCESS RIGHT  05/28/2022   KNEE ARTHROSCOPY WITH DRILLING/MICROFRACTURE Right 09/27/2018   Procedure: Right knee arthroscopy with partial lateral menisectomy;  Surgeon: Heide Ingle, MD;  Location: [facility];  Service: Orthopedics;  Laterality: Right;   NEPHRECTOMY Right    RENAL ARTERY EMBOLIZATION     2 procdures in the left , 4 in the right     TOTAL KNEE ARTHROPLASTY Right 03/27/2019   Procedure: TOTAL  KNEE ARTHROPLASTY;  Surgeon: Melodi Lerner, MD;  Location: [facility];  Service: Orthopedics;  Laterality: Right;     Allergies: Hydromorphone   Medications: Prior to Admission medications   Medication Sig Start Date End Date Taking? Authorizing Provider  ALPRAZolam  (XANAX ) 1 MG tablet Take 1 tablet (1 mg total) by mouth 3 (three) times daily as needed for anxiety. 05/04/24   Arfeen, Leni DASEN, MD  amphetamine -dextroamphetamine  (ADDERALL) 15 MG tablet Take 1 tablet by mouth 2 (two) times daily with a meal. Patient not taking: Reported on 02/24/2024 01/31/24 03/01/24  Arfeen, Syed T, MD   atomoxetine  (STRATTERA ) 40 MG capsule Take 1 capsule (40 mg total) by mouth 2 (two) times daily with a meal. 05/04/24 08/02/24  Arfeen, Leni DASEN, MD  labetalol  (NORMODYNE ) 200 MG tablet Take 200 mg by mouth 3 (three) times daily. 01/26/17   Rayburn Pac, MD  lamoTRIgine  (LAMICTAL ) 100 MG tablet Take 1 tablet (100 mg total) by mouth 2 (two) times daily. 05/04/24   Arfeen, Leni DASEN, MD  losartan  (COZAAR ) 50 MG tablet Take 50 mg by mouth daily.     [provider]  QUEtiapine  (SEROQUEL ) 300 MG tablet Take 1 tablet (300 mg total) by mouth at bedtime. 05/04/24   Arfeen, Leni DASEN, MD  Semaglutide-Weight Management (WEGOVY [place]) Inject 1 Dose into the skin every 14 (fourteen) days.    [provider]  sertraline  (ZOLOFT ) 100 MG tablet Take 1 tablet (100 mg total) by mouth in the morning and at bedtime. 05/04/24   Arfeen, Leni DASEN, MD     Family History  Problem Relation Age of Onset   Anxiety disorder Mother    ADD / ADHD Mother    Depression Mother    Lung cancer Father        smoker   Bipolar disorder Daughter    Bipolar disorder Other    Diabetes Other        a lot of relatives   Suicidality Neg Hx    Colon cancer Neg Hx    Rectal cancer Neg Hx    Stomach cancer Neg Hx    Esophageal cancer Neg Hx     Social History   Socioeconomic History   Marital status: Married    Spouse name: Not on file   Number of children: Not on file   Years of education: Not on file   Highest education level: Not on file  Occupational History   Not on file  Tobacco Use   Smoking status: Never   Smokeless tobacco: Never  Vaping Use   Vaping status: Never Used  Substance and Sexual Activity   Alcohol use: No    Alcohol/week: 0.0 standard drinks of alcohol   Drug use: No   Sexual activity: Yes    Partners: Male    Birth control/protection: None  Other Topics Concern   Not on file  Social History Narrative   Not on file   Social Drivers of Health   Financial Resource Strain: Not on file   Food Insecurity: Not on file  Transportation Needs: Not on file  Physical Activity: Not on file  Stress: Not on file  Social Connections: Not on file      Review of Systems  Constitutional: Negative.   Gastrointestinal: Negative.   Genitourinary:  Positive for flank pain. Negative for hematuria.  Neurological:        History of falls     Physical Exam No direct physical exam was performed (patient was  unable to get her video to work)   Imaging: MR ABDOMEN WWO CONTRAST Result Date: 07/11/2024 CLINICAL DATA:  History of tuberous sclerosis status post right nephrectomy with multiple renal angiomyolipomas status post catheter directed embolization of dominant left upper pole lesion 05/28/2022 EXAM: MRI ABDOMEN WITHOUT AND WITH CONTRAST TECHNIQUE: Multiplanar multisequence MR imaging of the abdomen was performed both before and after the administration of intravenous contrast. CONTRAST:  9mL GADAVIST  GADOBUTROL  1 MMOL/ML IV SOLN COMPARISON:  CT abdomen and pelvis dated 12/06/2023, MRI abdomen dated 11/19/2022, 01/30/2022 FINDINGS: Lower chest: Trace left pleural effusion. Hepatobiliary: Subcentimeter cyst in peripheral segment 8 (4:10) and 7 (4:18). No bile duct dilation. Normal gallbladder. Pancreas: No mass, inflammatory changes, or other parenchymal abnormality identified. Spleen:  Within normal limits in size and appearance. Adrenals/Urinary Tract: No adrenal nodules. Right nephrectomy. Enlarged left kidney contains multiple masses, as before. Continued contraction of previously embolized lateral left upper pole mass, no longer discretely measurable. Slight interval increase in size of many of the additional masses, for example: -4.0 x 3.9 cm medial upper pole (14:44), previously 3.7 x 3.5 cm (remeasured) -4.5 x 3.9 cm posterior upper pole (14:50), previously 4.2 x 3.7 cm -5.3 x 4.2 cm posterolateral interpolar (14:62), previously 5.0 x 3.8 cm A 3.7 x 3.3 cm lateral upper pole mass (14:47),  previously 3.1 x 2.8 cm, now also demonstrates an areas intrinsic T1 hyperintensity of the posterosuperior aspect (13:42). Stomach/Bowel: Small hiatal hernia. Visualized portions within the abdomen are unremarkable. Vascular/Lymphatic: No pathologically enlarged lymph nodes identified. No abdominal aortic aneurysm demonstrated. Other:  None. Musculoskeletal: No suspicious bone lesions identified. Partially imaged bilateral breast implants. Small fat-containing paraumbilical hernia. IMPRESSION: 1. Continued contraction of previously embolized lateral left upper pole mass, no longer discretely measurable. 2. Slight interval increase in size of multiple left renal masses, as described above. 3. A 3.7 x 3.3 cm lateral upper pole mass now demonstrates an area of intrinsic T1 hyperintensity of the posterosuperior aspect, which may reflect hemorrhage. 4. Trace left pleural effusion. Electronically Signed   By: Limin  Xu M.D.   On: 07/11/2024 21:32     Assessment and Plan:  58 year old with tuberous sclerosis and history of multiple angiomyolipomas and spontaneous renal hemorrhages.  History of right nephrectomy and previous embolizations to the left kidney.  Most recent treatment was particle embolization to a dominant lesion in left kidney upper pole in 2023.  Patient has chronic left flank pain with self-limiting increased flank pain in February 2025.  Most recent MRI imaging demonstrates multiple renal angiomyolipomas.  Slight interval increase in multiple left renal masses including a lateral upper pole mass that measures 3.7 x 3.3 cm with evidence for internal hemorrhage or blood products.  This lateral left upper pole lesion has enlarged since 2024 and MRI signal characteristics of this lesion are slightly different than the other lesions.  As a result, I am most concerned about this lesion for potential spontaneous hemorrhage.  I had a long discussion with the patient about additional embolizations to her  left kidney.  She understands that she has a solitary left kidney and embolization treatments put her at risk for decreased renal function.  In addition, many of these tumors are infiltrative on angiography and difficult to differentiate tumor from normal renal parenchyma.  Patient is motivated to have a treatment as soon as possible because she is scheduled to go to [place] in approximately 3 weeks.  Patient understands that many of her renal lesions are at risk  for spontaneous renal hemorrhage based on their size.  She understands that she would require multiple treatments for individual lesions in order to preserve her renal function.  We will try to schedule the patient for renal angiogram and possible embolization to the left kidney upper pole lateral lesion in the next week if possible.  If we cannot get the patient scheduled next week, she is comfortable with waiting until after she gets back from her trip.  Patient understands that the risks of the repeat embolization include not only potential renal dysfunction but vascular injury, bleeding and infection.  We will make arrangements to get the patient scheduled for repeat renal angiogram and embolization in the near future.   Electronically Signed: Juliene JONELLE Balder 07/19/2024, 4:06 PM   I spent a total of    40 Minutes in remote  clinical consultation, greater than 50% of which was counseling/coordinating care for multiple renal angiomyolipomas.    Visit type: Audio and video (EPIC; I was on video and able to share images with patient but I could not see patient because of technology problems).     Patient ID: Melissa Ochoa, female   DOB: 28-Dec-1965, 58 y.o.   MRN: [id number]

## 2024-07-20 ENCOUNTER — Other Ambulatory Visit: Payer: Self-pay | Admitting: Diagnostic Radiology

## 2024-07-20 DIAGNOSIS — D179 Benign lipomatous neoplasm, unspecified: Secondary | ICD-10-CM

## 2024-07-26 ENCOUNTER — Other Ambulatory Visit: Payer: Self-pay | Admitting: Radiology

## 2024-07-26 ENCOUNTER — Encounter (HOSPITAL_COMMUNITY): Payer: Self-pay | Admitting: Psychiatry

## 2024-07-26 ENCOUNTER — Other Ambulatory Visit (HOSPITAL_COMMUNITY): Payer: Self-pay | Admitting: Psychiatry

## 2024-07-26 ENCOUNTER — Telehealth (HOSPITAL_COMMUNITY): Admitting: Psychiatry

## 2024-07-26 VITALS — Wt 190.0 lb

## 2024-07-26 DIAGNOSIS — F3130 Bipolar disorder, current episode depressed, mild or moderate severity, unspecified: Secondary | ICD-10-CM

## 2024-07-26 DIAGNOSIS — D1771 Benign lipomatous neoplasm of kidney: Secondary | ICD-10-CM

## 2024-07-26 DIAGNOSIS — F900 Attention-deficit hyperactivity disorder, predominantly inattentive type: Secondary | ICD-10-CM

## 2024-07-26 DIAGNOSIS — F411 Generalized anxiety disorder: Secondary | ICD-10-CM | POA: Diagnosis not present

## 2024-07-26 MED ORDER — SERTRALINE HCL 100 MG PO TABS: 100.0000 mg | ORAL_TABLET | Freq: Two times a day (BID) | ORAL | 0 refills | Status: DC

## 2024-07-26 MED ORDER — ALPRAZOLAM 1 MG PO TABS: 1.0000 mg | ORAL_TABLET | Freq: Three times a day (TID) | ORAL | 2 refills | Status: DC | PRN

## 2024-07-26 MED ORDER — QUETIAPINE FUMARATE 300 MG PO TABS: 300.0000 mg | ORAL_TABLET | Freq: Every day | ORAL | 0 refills | Status: DC

## 2024-07-26 MED ORDER — LAMOTRIGINE 100 MG PO TABS: 100.0000 mg | ORAL_TABLET | Freq: Two times a day (BID) | ORAL | 0 refills | Status: DC

## 2024-07-26 MED ORDER — ATOMOXETINE HCL 40 MG PO CAPS: 40.0000 mg | ORAL_CAPSULE | Freq: Two times a day (BID) | ORAL | 0 refills | Status: DC

## 2024-07-26 NOTE — Progress Notes (Signed)
 [facility] MD Virtual Progress Note   Patient Location: Home Provider Location: Home Office  I connect with patient by video and verified that I am speaking with correct person by using two identifiers. I discussed the limitations of evaluation and management by telemedicine and the availability of in person appointments. I also discussed with the patient that there may be a patient responsible charge related to this service. The patient expressed understanding and agreed to proceed.  Melissa Ochoa [id number] 58 y.o.  07/26/2024 11:07 AM  History of Present Illness:  Patient is evaluated by video session.  On the last visit we increased Strattera  and she is taking 40 mg twice a day.  She noticed improvement in her attention and focus but is still there are times when she get distracted.  She excited about upcoming trip to [place] on her birthday.  She is going with her husband and her daughter.  Patient reported some anxiety as tomorrow having a renal procedure with interventional radiologist.  Patient has angiomyolipoma of the kidney.  Her doctor recommended to have a new scan.  She is taking labetalol  and losartan  to help the blood pressure.  She is also on Xanax , Seroquel , Zoloft , Lamictal  and Strattera .  She denies any mania, psychosis, hallucination.  She denies any impulsive behavior.  She feels her mood is somewhat stable and denies any hopelessness or worthlessness.  She is sleeping okay.  Her ADHD symptoms are stable.  We have requested a formal testing which she has not done it.  She denies any panic attack or any paranoia.  She took Wegovy for a while and lost weight but now her insurance do not approve the medication.  She has appointment to see her PCP in a week and she will consider other options.  Past Psychiatric History: H/O bipolar disorder, anxiety, ADHD.  Took Adderall but it was discontinued because not having formal psychological testing.  Tried Klonopin and  Paxil but did not like.  Saw Dr. Verba and Brutus.  No history of suicidal attempt, psychosis but history of mania.    Past Medical History:  Diagnosis Date   ADHD (attention deficit hyperactivity disorder)    Anxiety    Bipolar disorder (HCC)    Cervical disc disease    Colon polyps    adenomatous   Depression    Hypertension    Iron deficiency    Kidney disease    renal angiomyolipomas   Liver cyst    Melanosis    Pneumonia    Tuberous sclerosis (HCC)     Outpatient Encounter Medications as of 07/26/2024  Medication Sig   labetalol  (NORMODYNE ) 300 MG tablet Take 900 mg by mouth 2 (two) times daily.   ALPRAZolam  (XANAX ) 1 MG tablet Take 1 tablet (1 mg total) by mouth 3 (three) times daily as needed for anxiety.   atomoxetine  (STRATTERA ) 40 MG capsule Take 1 capsule (40 mg total) by mouth 2 (two) times daily with a meal.   lamoTRIgine  (LAMICTAL ) 100 MG tablet Take 1 tablet (100 mg total) by mouth 2 (two) times daily.   losartan  (COZAAR ) 50 MG tablet Take 50 mg by mouth daily.    QUEtiapine  (SEROQUEL ) 300 MG tablet Take 1 tablet (300 mg total) by mouth at bedtime.   sertraline  (ZOLOFT ) 100 MG tablet Take 1 tablet (100 mg total) by mouth in the morning and at bedtime.   [DISCONTINUED] ALPRAZolam  (XANAX ) 1 MG tablet Take 1 tablet (1 mg total) by mouth 3 (  three) times daily as needed for anxiety.   [DISCONTINUED] amphetamine -dextroamphetamine  (ADDERALL) 15 MG tablet Take 1 tablet by mouth 2 (two) times daily with a meal. (Patient not taking: Reported on 02/24/2024)   [DISCONTINUED] atomoxetine  (STRATTERA ) 40 MG capsule Take 1 capsule (40 mg total) by mouth 2 (two) times daily with a meal.   [DISCONTINUED] labetalol  (NORMODYNE ) 200 MG tablet Take 200 mg by mouth 3 (three) times daily.   [DISCONTINUED] lamoTRIgine  (LAMICTAL ) 100 MG tablet Take 1 tablet (100 mg total) by mouth 2 (two) times daily.   [DISCONTINUED] QUEtiapine  (SEROQUEL ) 300 MG tablet Take 1 tablet (300 mg total) by mouth  at bedtime.   [DISCONTINUED] Semaglutide-Weight Management (WEGOVY [place]) Inject 1 Dose into the skin every 14 (fourteen) days.   [DISCONTINUED] sertraline  (ZOLOFT ) 100 MG tablet Take 1 tablet (100 mg total) by mouth in the morning and at bedtime.   No facility-administered encounter medications on file as of 07/26/2024.    No results found for this or any previous visit (from the past 2160 hours).   Psychiatric Specialty Exam: Physical Exam  Review of Systems  Weight 190 lb (86.2 kg).There is no height or weight on file to calculate BMI.  General Appearance: Casual  Eye Contact:  Good  Speech:  Normal Rate  Volume:  Normal  Mood:  Anxious  Affect:  Appropriate  Thought Process:  Goal Directed  Orientation:  Full (Time, Place, and Person)  Thought Content:  Logical  Suicidal Thoughts:  No  Homicidal Thoughts:  No  Memory:  Immediate;   Good Recent;   Good Remote;   Good  Judgement:  Good  Insight:  Good  Psychomotor Activity:  Normal  Concentration:  Concentration: Good and Attention Span: Good  Recall:  Good  Fund of Knowledge:  Good  Language:  Good  Akathisia:  No  Handed:  Right  AIMS (if indicated):     Assets:  Communication Skills Desire for Improvement Housing Social Support Transportation  ADL's:  Intact  Cognition:  WNL  Sleep: Okay       03/25/2023    2:17 PM 05/07/2022    1:17 PM 02/05/2022    3:29 PM 01/01/2022   11:07 AM 08/28/2021    1:48 PM  Depression screen PHQ 2/9  Decreased Interest 2 2 0 1 2  Down, Depressed, Hopeless 2 3 1 3 2   PHQ - 2 Score 4 5 1 4 4   Altered sleeping 0 3 0 1 2  Tired, decreased energy 2 3 2 2 1   Change in appetite 2 1 2 2  0  Feeling bad or failure about yourself  3 3 2 3 3   Trouble concentrating 0 1 0 2 0  Moving slowly or fidgety/restless 0 0 0 0 0  Suicidal thoughts 0 0 0 0 0  PHQ-9 Score 11 16 7 14 10   Difficult doing work/chores Very difficult Extremely dIfficult Somewhat difficult Extremely dIfficult Very difficult     Assessment/Plan: Bipolar I disorder, most recent episode depressed (HCC) - Plan: atomoxetine  (STRATTERA ) 40 MG capsule, QUEtiapine  (SEROQUEL ) 300 MG tablet, sertraline  (ZOLOFT ) 100 MG tablet, lamoTRIgine  (LAMICTAL ) 100 MG tablet  GAD (generalized anxiety disorder) - Plan: atomoxetine  (STRATTERA ) 40 MG capsule, QUEtiapine  (SEROQUEL ) 300 MG tablet, sertraline  (ZOLOFT ) 100 MG tablet, ALPRAZolam  (XANAX ) 1 MG tablet  Attention deficit hyperactivity disorder (ADHD), predominantly inattentive type - Plan: atomoxetine  (STRATTERA ) 40 MG capsule  Patient is 58 year old Caucasian married female with history of osteoarthritis, angiomyolipoma of kidney, essential hypertension, bipolar disorder,  generalized anxiety disorder, ADHD.  Review current medication.  We had increased Strattera  from 25 mg twice a day to 40 mg twice a day.  She noticed some improvement.  She is going to have a procedure tomorrow by interventional radiologist.  Reassurance given.  She also excited about up coming trip to [place] on her birthday.  She lost weight as she was taking Wegovy but now her insurance do not approved.  She is going to talk to her PCP about other options.  Discussed medication side effects and benefits.  She has no rash, itching tremor or shakes.  Patient promised to have her blood work result faxed to us  as recently she had labs but we have no access.  Recommend to call back if she is any question or any concern.  Follow-up in 3 months.   Follow Up Instructions:     I discussed the assessment and treatment plan with the patient. The patient was provided an opportunity to ask questions and all were answered. The patient agreed with the plan and demonstrated an understanding of the instructions.   The patient was advised to call back or seek an in-person evaluation if the symptoms worsen or if the condition fails to improve as anticipated.    Collaboration of Care: Other provider involved in patient's care AEB  notes are available in epic to review  Patient/Guardian was advised Release of Information must be obtained prior to any record release in order to collaborate their care with an outside provider. Patient/Guardian was advised if they have not already done so to contact the registration department to sign all necessary forms in order for us  to release information regarding their care.   Consent: Patient/Guardian gives verbal consent for treatment and assignment of benefits for services provided during this visit. Patient/Guardian expressed understanding and agreed to proceed.     Total encounter time 20 minutes which includes face-to-face time, chart reviewed, care coordination, order entry and documentation during this encounter.   Note: This document was prepared by [employer] voice dictation technology and any errors that results from this process are unintentional.    Leni ONEIDA Client, MD 07/26/2024

## 2024-07-26 NOTE — Progress Notes (Signed)
 Patient for IR LT Renal Angiogram & Poss LT Upper Renal Embo on Thurs 07/27/24, I called and spoke with the patient on the phone and gave pre-procedure instructions. Pt was made aware to be here at 7:30a, NPO after MN prior to procedure as well as driver post procedure/recovery/discharge. Pt stated understanding. Called 07/26/24

## 2024-07-27 ENCOUNTER — Ambulatory Visit
Admission: RE | Admit: 2024-07-27 | Discharge: 2024-07-27 | Disposition: A | Discharge status: Home / Self Care | Source: Ambulatory Visit | Attending: Diagnostic Radiology | Admitting: Diagnostic Radiology

## 2024-07-27 ENCOUNTER — Other Ambulatory Visit: Payer: Self-pay | Admitting: Diagnostic Radiology

## 2024-07-27 ENCOUNTER — Encounter: Payer: Self-pay | Admitting: Radiology

## 2024-07-27 ENCOUNTER — Other Ambulatory Visit: Payer: Self-pay

## 2024-07-27 DIAGNOSIS — D179 Benign lipomatous neoplasm, unspecified: Secondary | ICD-10-CM

## 2024-07-27 DIAGNOSIS — D1771 Benign lipomatous neoplasm of kidney: Secondary | ICD-10-CM | POA: Diagnosis present

## 2024-07-27 DIAGNOSIS — R10A2 Flank pain, left side: Secondary | ICD-10-CM | POA: Diagnosis not present

## 2024-07-27 DIAGNOSIS — Z905 Acquired absence of kidney: Secondary | ICD-10-CM | POA: Diagnosis not present

## 2024-07-27 DIAGNOSIS — M1990 Unspecified osteoarthritis, unspecified site: Secondary | ICD-10-CM | POA: Insufficient documentation

## 2024-07-27 LAB — CBC
HCT: 39.2 % (ref 36.0–46.0)
Hemoglobin: 12.6 g/dL (ref 12.0–15.0)
MCH: 29.2 pg (ref 26.0–34.0)
MCHC: 32.1 g/dL (ref 30.0–36.0)
MCV: 90.7 fL (ref 80.0–100.0)
Platelets: 255 K/uL (ref 150–400)
RBC: 4.32 MIL/uL (ref 3.87–5.11)
RDW: 13.8 % (ref 11.5–15.5)
WBC: 4.2 K/uL (ref 4.0–10.5)
nRBC: 0 % (ref 0.0–0.2)

## 2024-07-27 LAB — PROTIME-INR
INR: 0.8 (ref 0.8–1.2)
Prothrombin Time: 12.1 s (ref 11.4–15.2)

## 2024-07-27 LAB — BASIC METABOLIC PANEL WITH GFR
Anion gap: 13 (ref 5–15)
BUN: 20 mg/dL (ref 6–20)
CO2: 26 mmol/L (ref 22–32)
Calcium: 9.4 mg/dL (ref 8.9–10.3)
Chloride: 104 mmol/L (ref 98–111)
Creatinine, Ser: 0.88 mg/dL (ref 0.44–1.00)
GFR, Estimated: >60 mL/min (ref >60)
Glucose, Bld: 89 mg/dL (ref 70–99)
Potassium: 3.7 mmol/L (ref 3.5–5.1)
Sodium: 143 mmol/L (ref 135–145)

## 2024-07-27 MED ORDER — ACETAMINOPHEN 325 MG PO TABS
ORAL_TABLET | ORAL | Status: AC
  Filled 2024-07-27: qty 2

## 2024-07-27 MED ORDER — IODIXANOL 270 MG/ML IV SOLN
110.0000 mL | Freq: Once | INTRAVENOUS | Status: AC
  Administered 2024-07-27: 100 mL

## 2024-07-27 MED ORDER — ACETAMINOPHEN 500 MG PO TABS
500.0000 mg | ORAL_TABLET | Freq: Four times a day (QID) | ORAL | Status: DC | PRN
  Administered 2024-07-27: 500 mg via ORAL

## 2024-07-27 MED ORDER — HYDROCODONE-ACETAMINOPHEN 5-325 MG PO TABS
1.0000 | ORAL_TABLET | Freq: Four times a day (QID) | ORAL | Status: DC | PRN
  Administered 2024-07-27: 1 via ORAL

## 2024-07-27 MED ORDER — ACETAMINOPHEN 500 MG PO TABS
ORAL_TABLET | ORAL | Status: AC
  Filled 2024-07-27: qty 1

## 2024-07-27 MED ORDER — SODIUM CHLORIDE 0.9 % IV SOLN: INTRAVENOUS | Status: DC

## 2024-07-27 MED ORDER — CEFAZOLIN SODIUM-DEXTROSE 2-4 GM/100ML-% IV SOLN
INTRAVENOUS | Status: AC
  Filled 2024-07-27: qty 100

## 2024-07-27 MED ORDER — HYDROCODONE-ACETAMINOPHEN 5-325 MG PO TABS
ORAL_TABLET | ORAL | Status: AC
  Filled 2024-07-27: qty 1

## 2024-07-27 MED ORDER — LIDOCAINE HCL 1 % IJ SOLN
INTRAMUSCULAR | Status: AC
Start: 2024-07-27 — End: 2024-07-27
  Filled 2024-07-27: qty 20

## 2024-07-27 MED ORDER — MIDAZOLAM HCL 2 MG/2ML IJ SOLN
INTRAMUSCULAR | Status: AC
  Filled 2024-07-27: qty 2

## 2024-07-27 MED ORDER — CEFAZOLIN SODIUM-DEXTROSE 2-4 GM/100ML-% IV SOLN
2.0000 g | INTRAVENOUS | Status: AC
  Administered 2024-07-27: 2 g via INTRAVENOUS

## 2024-07-27 MED ORDER — FENTANYL CITRATE (PF) 100 MCG/2ML IJ SOLN
INTRAMUSCULAR | Status: AC | PRN
  Administered 2024-07-27: 50 ug via INTRAVENOUS

## 2024-07-27 MED ORDER — MIDAZOLAM HCL (PF) 2 MG/2ML IJ SOLN
INTRAMUSCULAR | Status: AC | PRN
  Administered 2024-07-27: 1 mg via INTRAVENOUS

## 2024-07-27 MED ORDER — LIDOCAINE HCL 1 % IJ SOLN
7.0000 mL | Freq: Once | INTRAMUSCULAR | Status: AC
  Administered 2024-07-27: 7 mL via INTRADERMAL

## 2024-07-27 MED ORDER — HYDROCODONE-ACETAMINOPHEN 5-325 MG PO TABS: 1.0000 | ORAL_TABLET | Freq: Four times a day (QID) | ORAL | 0 refills | Status: AC | PRN

## 2024-07-27 MED ORDER — FENTANYL CITRATE (PF) 100 MCG/2ML IJ SOLN
INTRAMUSCULAR | Status: AC
  Filled 2024-07-27: qty 2

## 2024-07-27 NOTE — H&P (Signed)
 Chief Complaint:  Renal Angiomyolipomas  Procedure: Renal Angiogram with possible embolization of left kidney  Referring Provider(s): Dr. Fairy Sellar  Supervising Physician: Philip Cornet  Patient Status: [facility] - Out-pt  History of Present Illness: Melissa Ochoa is a 58 y.o. female with a history of tuberous sclerosis, bilateral renal angiomyolipomas, spontaneous renal hemorrhage, right nephrectomy in 2011, left renal embolization, and particle embolization of the dominant angiomyolipoma in the upper pole of her left kidney with Dr. Philip in 2023. At follow up in February, patient reported continued left flank pain. Recent follow up MRI from 10/14 demonstrated slight increase in multiple left renal masses and a 3.7 x 3.3cm upper pole mass that could reflect hemorrhage. Due to this, patient was eager to discuss subsequent embolization at her most recent follow up on 10/22. After long conversation with Dr. Philip regarding risks and benefits of the procedure given her condition, patient was agreeable to move forward with angiogram with possible embolization.   She presents today for her procedure. States that she continues to have left flank pain, intermittent falls, and worsening arthritis pains. Otherwise without complaints. Denies any fevers/chills, nausea/vomiting, chest pain, shortness of breath, or abdominal pain. NPO since midnight. All questions and concerns answered at midnight.    Patient is Full Code  Past Medical History:  Diagnosis Date   ADHD (attention deficit hyperactivity disorder)    Anxiety    Bipolar disorder (HCC)    Cervical disc disease    Colon polyps    adenomatous   Depression    Hypertension    Iron deficiency    Kidney disease    renal angiomyolipomas   Liver cyst    Melanosis    Pneumonia    Tuberous sclerosis (HCC)     Past Surgical History:  Procedure Laterality Date   COLONOSCOPY W/ ENDOSCOPIC US      IR EMBO TUMOR ORGAN ISCHEMIA INFARCT  INC GUIDE ROADMAPPING  05/28/2022   IR RADIOLOGIST EVAL & MGMT  02/24/2022   IR RADIOLOGIST EVAL & MGMT  07/21/2022   IR RADIOLOGIST EVAL & MGMT  07/19/2024   IR RENAL SUPRASEL UNI S&I MOD SED  03/23/2022   IR RENAL SUPRASEL UNI S&I MOD SED  05/28/2022   IR US  GUIDE VASC ACCESS RIGHT  03/23/2022   IR US  GUIDE VASC ACCESS RIGHT  05/28/2022   KNEE ARTHROSCOPY WITH DRILLING/MICROFRACTURE Right 09/27/2018   Procedure: Right knee arthroscopy with partial lateral menisectomy;  Surgeon: Heide Ingle, MD;  Location: [facility];  Service: Orthopedics;  Laterality: Right;   NEPHRECTOMY Right    RENAL ARTERY EMBOLIZATION     2 procdures in the left , 4 in the right     TOTAL KNEE ARTHROPLASTY Right 03/27/2019   Procedure: TOTAL KNEE ARTHROPLASTY;  Surgeon: Melodi Lerner, MD;  Location: [facility];  Service: Orthopedics;  Laterality: Right;     Allergies: Hydromorphone   Medications: Prior to Admission medications   Medication Sig Start Date End Date Taking? Authorizing Provider  ALPRAZolam  (XANAX ) 1 MG tablet Take 1 tablet (1 mg total) by mouth 3 (three) times daily as needed for anxiety. 07/26/24  Yes Arfeen, Leni DASEN, MD  atomoxetine  (STRATTERA ) 40 MG capsule Take 1 capsule (40 mg total) by mouth 2 (two) times daily with a meal. 07/26/24 10/24/24 Yes Arfeen, Leni DASEN, MD  labetalol  (NORMODYNE ) 300 MG tablet Take 900 mg by mouth 2 (two) times daily. 04/28/24  Yes [provider]  lamoTRIgine  (LAMICTAL ) 100 MG tablet Take  1 tablet (100 mg total) by mouth 2 (two) times daily. 07/26/24  Yes Arfeen, Leni DASEN, MD  losartan  (COZAAR ) 50 MG tablet Take 50 mg by mouth daily.    Yes [provider]  QUEtiapine  (SEROQUEL ) 300 MG tablet Take 1 tablet (300 mg total) by mouth at bedtime. 07/26/24  Yes Arfeen, Leni DASEN, MD  sertraline  (ZOLOFT ) 100 MG tablet Take 1 tablet (100 mg total) by mouth in the morning and at bedtime. 07/26/24  Yes Arfeen, Leni DASEN, MD     Family History  Problem Relation Age of Onset    Anxiety disorder Mother    ADD / ADHD Mother    Depression Mother    Lung cancer Father        smoker   Bipolar disorder Daughter    Bipolar disorder Other    Diabetes Other        a lot of relatives   Suicidality Neg Hx    Colon cancer Neg Hx    Rectal cancer Neg Hx    Stomach cancer Neg Hx    Esophageal cancer Neg Hx     Social History   Socioeconomic History   Marital status: Married    Spouse name: Not on file   Number of children: Not on file   Years of education: Not on file   Highest education level: Not on file  Occupational History   Not on file  Tobacco Use   Smoking status: Never   Smokeless tobacco: Never  Vaping Use   Vaping status: Never Used  Substance and Sexual Activity   Alcohol use: No    Alcohol/week: 0.0 standard drinks of alcohol   Drug use: No   Sexual activity: Yes    Partners: Male    Birth control/protection: None  Other Topics Concern   Not on file  Social History Narrative   Not on file   Social Drivers of Health   Financial Resource Strain: Not on file  Food Insecurity: Not on file  Transportation Needs: Not on file  Physical Activity: Not on file  Stress: Not on file  Social Connections: Not on file     Review of Systems  Genitourinary:  Positive for flank pain (left). Negative for dysuria and hematuria.  Patient denies any headache, chest pain, shortness of breath, abdominal pain, N/V, or fever/chills. All other systems are negative.   Vital Signs: BP (!) 154/93   Pulse 78   Temp 98.2 F (36.8 C) (Temporal)   Resp 14   Ht 5' 8 (1.727 m)   Wt 189 lb (85.7 kg)   LMP  (LMP Unknown) Comment: reports LMP was over a year ago   SpO2 96%   BMI 28.74 kg/m     Physical Exam Vitals reviewed.  Constitutional:      Appearance: Normal appearance.  HENT:     Mouth/Throat:     Mouth: Mucous membranes are moist.     Pharynx: Oropharynx is clear.  Cardiovascular:     Rate and Rhythm: Normal rate and regular rhythm.      Pulses: Normal pulses.     Heart sounds: Normal heart sounds.  Pulmonary:     Effort: Pulmonary effort is normal.     Breath sounds: Normal breath sounds.  Abdominal:     General: Abdomen is flat.     Palpations: Abdomen is soft.     Tenderness: There is no abdominal tenderness.  Skin:    General: Skin is warm and  dry.  Neurological:     Mental Status: She is alert and oriented to person, place, and time.  Psychiatric:        Behavior: Behavior normal.     Imaging: IR Radiologist Eval & Mgmt Result Date: 07/19/2024 : See note in Epic. Electronically Signed   By: Juliene Balder M.D.   On: 07/19/2024 16:43   MR ABDOMEN WWO CONTRAST Result Date: 07/11/2024 CLINICAL DATA:  History of tuberous sclerosis status post right nephrectomy with multiple renal angiomyolipomas status post catheter directed embolization of dominant left upper pole lesion 05/28/2022 EXAM: MRI ABDOMEN WITHOUT AND WITH CONTRAST TECHNIQUE: Multiplanar multisequence MR imaging of the abdomen was performed both before and after the administration of intravenous contrast. CONTRAST:  9mL GADAVIST  GADOBUTROL  1 MMOL/ML IV SOLN COMPARISON:  CT abdomen and pelvis dated 12/06/2023, MRI abdomen dated 11/19/2022, 01/30/2022 FINDINGS: Lower chest: Trace left pleural effusion. Hepatobiliary: Subcentimeter cyst in peripheral segment 8 (4:10) and 7 (4:18). No bile duct dilation. Normal gallbladder. Pancreas: No mass, inflammatory changes, or other parenchymal abnormality identified. Spleen:  Within normal limits in size and appearance. Adrenals/Urinary Tract: No adrenal nodules. Right nephrectomy. Enlarged left kidney contains multiple masses, as before. Continued contraction of previously embolized lateral left upper pole mass, no longer discretely measurable. Slight interval increase in size of many of the additional masses, for example: -4.0 x 3.9 cm medial upper pole (14:44), previously 3.7 x 3.5 cm (remeasured) -4.5 x 3.9 cm posterior  upper pole (14:50), previously 4.2 x 3.7 cm -5.3 x 4.2 cm posterolateral interpolar (14:62), previously 5.0 x 3.8 cm A 3.7 x 3.3 cm lateral upper pole mass (14:47), previously 3.1 x 2.8 cm, now also demonstrates an areas intrinsic T1 hyperintensity of the posterosuperior aspect (13:42). Stomach/Bowel: Small hiatal hernia. Visualized portions within the abdomen are unremarkable. Vascular/Lymphatic: No pathologically enlarged lymph nodes identified. No abdominal aortic aneurysm demonstrated. Other:  None. Musculoskeletal: No suspicious bone lesions identified. Partially imaged bilateral breast implants. Small fat-containing paraumbilical hernia. IMPRESSION: 1. Continued contraction of previously embolized lateral left upper pole mass, no longer discretely measurable. 2. Slight interval increase in size of multiple left renal masses, as described above. 3. A 3.7 x 3.3 cm lateral upper pole mass now demonstrates an area of intrinsic T1 hyperintensity of the posterosuperior aspect, which may reflect hemorrhage. 4. Trace left pleural effusion. Electronically Signed   By: Limin  Xu M.D.   On: 07/11/2024 21:32    Labs:  CBC: No results for input(s): WBC, HGB, HCT, PLT in the last 8760 hours.  COAGS: No results for input(s): INR, APTT in the last 8760 hours.  BMP: No results for input(s): NA, K, CL, CO2, GLUCOSE, BUN, CALCIUM, CREATININE, GFRNONAA, GFRAA in the last 8760 hours.  Invalid input(s): CMP  LIVER FUNCTION TESTS: No results for input(s): BILITOT, AST, ALT, ALKPHOS, PROT, ALBUMIN in the last 8760 hours.  TUMOR MARKERS: No results for input(s): AFPTM, CEA, CA199, CHROMGRNA in the last 8760 hours.  Assessment and Plan:  Renal Angiomyolipomas: Melissa Ochoa is a 58 y.o. female with a history of tubular sclerosis and multiple renal angiomyolipomas following right renal nephrectomy and several embolizations of her left kidney. She presents  today for left renal angiogram with possible embolization  with Dr. DELENA Balder. Procedure to be performed under moderate sedation.  -NPO since midnight -Labs WNL -Will plan for continuous hydration with IV fluids for renal protection  The Risks and benefits of embolization were discussed with the patient including, but not limited  to bleeding, infection, vascular injury, post operative pain, or contrast induced renal failure.  This procedure involves the use of X-rays and because of the nature of the planned procedure, it is possible that we will have prolonged use of X-ray fluoroscopy.  Potential radiation risks to you include (but are not limited to) the following: - A slightly elevated risk for cancer several years later in life. This risk is typically less than 0.5% percent. This risk is low in comparison to the normal incidence of human cancer, which is 33% for women and 50% for men according to the [employer] Cancer Society. - Radiation induced injury can include skin redness, resembling a rash, tissue breakdown / ulcers and hair loss (which can be temporary or permanent).   The likelihood of either of these occurring depends on the difficulty of the procedure and whether you are sensitive to radiation due to previous procedures, disease, or genetic conditions.   IF your procedure requires a prolonged use of radiation, you will be notified and given written instructions for further action.  It is your responsibility to monitor the irradiated area for the 2 weeks following the procedure and to notify your physician if you are concerned that you have suffered a radiation induced injury.    All of the patient's questions were answered, patient is agreeable to proceed. Consent signed and in chart.   Thank you for allowing our service to participate in Melissa Ochoa 's care.    Electronically Signed: Yenifer Saccente M Athalee Esterline, PA-C   07/27/2024, 7:54 AM     I spent a total of  15 Minutes  in face to face in clinical consultation, greater than 50% of which was counseling/coordinating care for

## 2024-07-27 NOTE — Procedures (Signed)
 Interventional Radiology Procedure:   Indications: Enlarging left renal AMLs  Procedure: Left renal arteriography with selective angiography and particle embolization in upper pole AMLs  Findings: Selective angiography demonstrated multiple tumors in left kidney upper pole, including the exophytic lesion with concern for hemorrhage on prior MRI.  Two branches of upper pole were selected for particle embolization with 300-500 micron Embospheres.  Right groin closure with AngioSeal.    Complications: None     EBL: Minimal   Plan:  Bedrest 3 hours.  Continue to hydrate with IV fluids and oral intake.  Anticipate discharge after 3 hour bedrest.    Shaindy Reader R. Philip, MD  Pager: [phone]

## 2024-07-28 ENCOUNTER — Other Ambulatory Visit: Payer: Self-pay | Admitting: Diagnostic Radiology

## 2024-07-28 DIAGNOSIS — N2889 Other specified disorders of kidney and ureter: Secondary | ICD-10-CM

## 2024-07-29 ENCOUNTER — Telehealth: Admitting: Emergency Medicine

## 2024-07-29 DIAGNOSIS — T7840XA Allergy, unspecified, initial encounter: Secondary | ICD-10-CM | POA: Diagnosis not present

## 2024-07-29 MED ORDER — PREDNISONE 20 MG PO TABS: 40.0000 mg | ORAL_TABLET | Freq: Every day | ORAL | 0 refills | Status: AC

## 2024-07-29 NOTE — Patient Instructions (Signed)
 Melissa Ochoa, thank you for joining Melissa CHRISTELLA Belt, NP for today's virtual visit.  While this provider is not your primary care provider (PCP), if your PCP is located in our provider database this encounter information will be shared with them immediately following your visit.   A [facility] MyChart account gives you access to today's visit and all your visits, tests, and labs performed at [facility]  click here if you don't have a [facility] MyChart account or go to [email or web address]  Consent: (Patient) Melissa Ochoa provided verbal consent for this virtual visit at the beginning of the encounter.  Current Medications:  Current Outpatient Medications:    predniSONE (DELTASONE) 20 MG tablet, Take 2 tablets (40 mg total) by mouth daily with breakfast for 5 days., Disp: 10 tablet, Rfl: 0   ALPRAZolam  (XANAX ) 1 MG tablet, Take 1 tablet (1 mg total) by mouth 3 (three) times daily as needed for anxiety., Disp: 90 tablet, Rfl: 2   atomoxetine  (STRATTERA ) 40 MG capsule, Take 1 capsule (40 mg total) by mouth 2 (two) times daily with a meal., Disp: 180 capsule, Rfl: 0   HYDROcodone -acetaminophen  (NORCO/VICODIN) 5-325 MG tablet, Take 1 tablet by mouth every 6 (six) hours as needed for up to 3 days for severe pain (pain score 7-10)., Disp: 12 tablet, Rfl: 0   labetalol  (NORMODYNE ) 300 MG tablet, Take 900 mg by mouth 2 (two) times daily., Disp: , Rfl:    lamoTRIgine  (LAMICTAL ) 100 MG tablet, Take 1 tablet (100 mg total) by mouth 2 (two) times daily., Disp: 180 tablet, Rfl: 0   losartan  (COZAAR ) 50 MG tablet, Take 50 mg by mouth daily. , Disp: , Rfl:    QUEtiapine  (SEROQUEL ) 300 MG tablet, Take 1 tablet (300 mg total) by mouth at bedtime., Disp: 90 tablet, Rfl: 0   sertraline  (ZOLOFT ) 100 MG tablet, Take 1 tablet (100 mg total) by mouth in the morning and at bedtime., Disp: 180 tablet, Rfl: 0   Medications ordered in this encounter:  Meds ordered this encounter   Medications   predniSONE (DELTASONE) 20 MG tablet    Sig: Take 2 tablets (40 mg total) by mouth daily with breakfast for 5 days.    Dispense:  10 tablet    Refill:  0     *If you need refills on other medications prior to your next appointment, please contact your pharmacy*  Follow-Up: Call back or seek an in-person evaluation if the symptoms worsen or if the condition fails to improve as anticipated.  [facility] [phone]  Other Instructions Take benadryl  on a schedule while you are red and itchy.   Let Dr. Philip know what has happened.   If you are not getting better or if your symptoms are becoming more severe, please be seen in person. If you have any trouble breathing, call 911/be seen in ER.    If you have been instructed to have an in-person evaluation today at a local Urgent Care facility, please use the link below. It will take you to a list of all of our available [facility], including address, phone number and hours of operation. Please do not delay care.  [facility]  If you or a family member do not have a primary care provider, use the link below to schedule a visit and establish care. When you choose a [facility] primary care physician or advanced practice provider, you gain a long-term partner in health.  Find a Primary Care Provider  Learn more about [facility]'s in-office and virtual care options: [place] [facility] - Get Care Now

## 2024-07-29 NOTE — Progress Notes (Signed)
 Virtual Visit Consent   Melissa Ochoa, you are scheduled for a virtual visit with a [facility] provider today. Just as with appointments in the office, your consent must be obtained to participate. Your consent will be active for this visit and any virtual visit you may have with one of our providers in the next 365 days. If you have a MyChart account, a copy of this consent can be sent to you electronically.  As this is a virtual visit, video technology does not allow for your provider to perform a traditional examination. This may limit your provider's ability to fully assess your condition. If your provider identifies any concerns that need to be evaluated in person or the need to arrange testing (such as labs, EKG, etc.), we will make arrangements to do so. Although advances in technology are sophisticated, we cannot ensure that it will always work on either your end or our end. If the connection with a video visit is poor, the visit may have to be switched to a telephone visit. With either a video or telephone visit, we are not always able to ensure that we have a secure connection.  By engaging in this virtual visit, you consent to the provision of healthcare and authorize for your insurance to be billed (if applicable) for the services provided during this visit. Depending on your insurance coverage, you may receive a charge related to this service.  I need to obtain your verbal consent now. Are you willing to proceed with your visit today? Melissa Ochoa has provided verbal consent on 07/29/2024 for a virtual visit (video or telephone). Melissa CHRISTELLA Belt, NP  Date: 07/29/2024 2:07 PM   Virtual Visit via Video Note   I, Melissa Ochoa, connected with  Melissa Ochoa  ([id number], [birth year]) on 07/29/24 at  2:00 PM EDT by a video-enabled telemedicine application and verified that I am speaking with the correct person using two identifiers.  Location: Patient: Virtual Visit Location  Patient: Home Provider: Virtual Visit Location Provider: Home Office   I discussed the limitations of evaluation and management by telemedicine and the availability of in person appointments. The patient expressed understanding and agreed to proceed.    History of Present Illness: Melissa Ochoa is a 58 y.o. who identifies as a female who was assigned female at birth, and is being seen today for red ithcy skin.  IR procedure 07/27/24. Review of records in Epic show she received contrast, ancef , fentanyl , versed . She has had these adminstered at prior procedures in the past. Was fine until yesterday. Now has face and body red and blotchy, body itchy all over. No fever.   Took one Benadryl  today, hasn't helped]  Has not taken any hydrocodone /acetaminophen  prescribed for home use yet.   Denies angioedema or SOB  HPI: HPI  Problems:  Patient Active Problem List   Diagnosis Date Noted   Renal angiolipoma 05/28/2022   Angiomyolipoma of left kidney 05/28/2022   Renal angiomyolipoma 05/27/2022   Renal mass, left 03/23/2022   OA (osteoarthritis) of knee 03/27/2019   Osteoarthritis of right knee 03/27/2019   Aftercare 09/29/2018   Pain in right knee 08/03/2018   Diarrhea 10/27/2017   Bipolar affective disorder, currently depressed, moderate (HCC) 05/17/2014   GAD (generalized anxiety disorder) 05/17/2014   ADD (attention deficit disorder) 05/17/2014   Aphthous Ulcer 04/19/2014    Allergies:  Allergies  Allergen Reactions   Hydromorphone  Nausea Only and Other (See Comments)    hydromorphone   Medications:  Current Outpatient Medications:    predniSONE (DELTASONE) 20 MG tablet, Take 2 tablets (40 mg total) by mouth daily with breakfast for 5 days., Disp: 10 tablet, Rfl: 0   ALPRAZolam  (XANAX ) 1 MG tablet, Take 1 tablet (1 mg total) by mouth 3 (three) times daily as needed for anxiety., Disp: 90 tablet, Rfl: 2   atomoxetine  (STRATTERA ) 40 MG capsule, Take 1 capsule (40 mg total) by  mouth 2 (two) times daily with a meal., Disp: 180 capsule, Rfl: 0   HYDROcodone -acetaminophen  (NORCO/VICODIN) 5-325 MG tablet, Take 1 tablet by mouth every 6 (six) hours as needed for up to 3 days for severe pain (pain score 7-10)., Disp: 12 tablet, Rfl: 0   labetalol  (NORMODYNE ) 300 MG tablet, Take 900 mg by mouth 2 (two) times daily., Disp: , Rfl:    lamoTRIgine  (LAMICTAL ) 100 MG tablet, Take 1 tablet (100 mg total) by mouth 2 (two) times daily., Disp: 180 tablet, Rfl: 0   losartan  (COZAAR ) 50 MG tablet, Take 50 mg by mouth daily. , Disp: , Rfl:    QUEtiapine  (SEROQUEL ) 300 MG tablet, Take 1 tablet (300 mg total) by mouth at bedtime., Disp: 90 tablet, Rfl: 0   sertraline  (ZOLOFT ) 100 MG tablet, Take 1 tablet (100 mg total) by mouth in the morning and at bedtime., Disp: 180 tablet, Rfl: 0  Observations/Objective: Patient is well-developed, well-nourished in no acute distress.  Resting comfortably  at home.  Head is normocephalic, atraumatic.  No labored breathing.  Speech is clear and coherent with logical content.  Patient is alert and oriented at baseline.  I cannot see rash on video. Skin does look slightly pink  Assessment and Plan: 1. Allergic reaction, initial encounter (Primary)  Unsure what she is reacting to. Needs to take benadryl  on a schedule, I have rx prednisone, she needs to let Dr. Philip know about reaction.   Follow Up Instructions: I discussed the assessment and treatment plan with the patient. The patient was provided an opportunity to ask questions and all were answered. The patient agreed with the plan and demonstrated an understanding of the instructions.  A copy of instructions were sent to the patient via MyChart unless otherwise noted below.   The patient was advised to call back or seek an in-person evaluation if the symptoms worsen or if the condition fails to improve as anticipated.    Melissa CHRISTELLA Belt, NP

## 2024-07-31 DIAGNOSIS — Q851 Tuberous sclerosis: Secondary | ICD-10-CM | POA: Diagnosis not present

## 2024-07-31 DIAGNOSIS — I129 Hypertensive chronic kidney disease with stage 1 through stage 4 chronic kidney disease, or unspecified chronic kidney disease: Secondary | ICD-10-CM | POA: Diagnosis not present

## 2024-07-31 DIAGNOSIS — E66811 Obesity, class 1: Secondary | ICD-10-CM | POA: Diagnosis not present

## 2024-07-31 DIAGNOSIS — N182 Chronic kidney disease, stage 2 (mild): Secondary | ICD-10-CM | POA: Diagnosis not present

## 2024-08-03 ENCOUNTER — Telehealth

## 2024-08-03 ENCOUNTER — Ambulatory Visit
Admission: RE | Admit: 2024-08-03 | Discharge: 2024-08-03 | Disposition: A | Discharge status: Home / Self Care | Source: Ambulatory Visit | Attending: Diagnostic Radiology | Admitting: Diagnostic Radiology

## 2024-08-03 DIAGNOSIS — N2889 Other specified disorders of kidney and ureter: Secondary | ICD-10-CM | POA: Diagnosis not present

## 2024-08-03 NOTE — Progress Notes (Signed)
 Chief Complaint: Patient was consulted remotely today (TeleHealth) for follow-up after left renal embolization.  Referring Physician(s): Coladonato, Fairy  History of Present Illness: Melissa Ochoa is a 58 y.o. female with tuberous sclerosis and history of bilateral renal angiomyolipomas.  History of spontaneous renal hemorrhages and the patient had a right nephrectomy in 2011.  Patient also had previous embolizations to the left kidney.  Most recently, the patient had particle embolization to her dominant angiomyolipoma in the left kidney upper pole on 05/28/2022.   Patient had a good imaging response to the embolization on 05/28/2022.  There was evidence for infarction in the dominant AML in the left kidney upper pole.  Follow-up MRI on 07/11/2004 demonstrates persistent multiple left renal lesions compatible with angiomyolipomas.  Many of the lesions demonstrate mild interval growth.  There is concern for an enlarging lesion in the lateral left upper pole measuring up to 3.7 cm and containing some hemorrhage.  As a result, left renal angiogram and embolization was performed 07/27/2024.  We targeted the lesion in the left kidney upper pole due to concern for hemorrhage.  Embolization of the superior lateral left kidney lesions appear to be successful and the patient had no immediate complications.  Patient was discharged same day following the procedure.  Patient had mild pain and aching for 2 nights after the procedure.  She did not take any pain medicines.  In addition, she developed redness and bumps on her face and torso approximately 24 hours after the procedure.  This was presumed to represent an allergic reaction of uncertain etiology.  The symptoms resolved with the combination of steroids and Benadryl .  Currently, patient is asymptomatic.  She denies any hematuria or dysuria.  Patient had a nephrology follow-up earlier in the week and got labs.  She states that her renal function was  normal but I do not have any record of that.  Past Medical History:  Diagnosis Date   ADHD (attention deficit hyperactivity disorder)    Anxiety    Bipolar disorder (HCC)    Cervical disc disease    Colon polyps    adenomatous   Depression    Hypertension    Iron deficiency    Kidney disease    renal angiomyolipomas   Liver cyst    Melanosis    Pneumonia    Tuberous sclerosis (HCC)     Past Surgical History:  Procedure Laterality Date   COLONOSCOPY W/ ENDOSCOPIC US      IR EMBO TUMOR ORGAN ISCHEMIA INFARCT INC GUIDE ROADMAPPING  05/28/2022   IR EMBO TUMOR ORGAN ISCHEMIA INFARCT INC GUIDE ROADMAPPING  07/27/2024   IR RADIOLOGIST EVAL & MGMT  02/24/2022   IR RADIOLOGIST EVAL & MGMT  07/21/2022   IR RADIOLOGIST EVAL & MGMT  07/19/2024   IR RENAL SUPRASEL UNI S&I MOD SED  03/23/2022   IR RENAL SUPRASEL UNI S&I MOD SED  05/28/2022   IR US  GUIDE VASC ACCESS RIGHT  03/23/2022   IR US  GUIDE VASC ACCESS RIGHT  05/28/2022   KNEE ARTHROSCOPY WITH DRILLING/MICROFRACTURE Right 09/27/2018   Procedure: Right knee arthroscopy with partial lateral menisectomy;  Surgeon: Heide Ingle, MD;  Location: [facility];  Service: Orthopedics;  Laterality: Right;   NEPHRECTOMY Right    RENAL ARTERY EMBOLIZATION     2 procdures in the left , 4 in the right     TOTAL KNEE ARTHROPLASTY Right 03/27/2019   Procedure: TOTAL KNEE ARTHROPLASTY;  Surgeon: Melodi Lerner, MD;  Location: [facility];  Service: Orthopedics;  Laterality: Right;     Allergies: Hydromorphone   Medications: Prior to Admission medications   Medication Sig Start Date End Date Taking? Authorizing Provider  ALPRAZolam  (XANAX ) 1 MG tablet Take 1 tablet (1 mg total) by mouth 3 (three) times daily as needed for anxiety. 07/26/24   Arfeen, Leni DASEN, MD  atomoxetine  (STRATTERA ) 40 MG capsule Take 1 capsule (40 mg total) by mouth 2 (two) times daily with a meal. 07/26/24 10/24/24  Arfeen, Leni DASEN, MD  labetalol  (NORMODYNE ) 300 MG tablet Take 900 mg  by mouth 2 (two) times daily. 04/28/24   [provider]  lamoTRIgine  (LAMICTAL ) 100 MG tablet Take 1 tablet (100 mg total) by mouth 2 (two) times daily. 07/26/24   Arfeen, Leni DASEN, MD  losartan  (COZAAR ) 50 MG tablet Take 50 mg by mouth daily.     [provider]  predniSONE (DELTASONE) 20 MG tablet Take 2 tablets (40 mg total) by mouth daily with breakfast for 5 days. 07/29/24 08/03/24  Richad Jon HERO, NP  QUEtiapine  (SEROQUEL ) 300 MG tablet Take 1 tablet (300 mg total) by mouth at bedtime. 07/26/24   Arfeen, Leni DASEN, MD  sertraline  (ZOLOFT ) 100 MG tablet Take 1 tablet (100 mg total) by mouth in the morning and at bedtime. 07/26/24   Arfeen, Leni DASEN, MD     Family History  Problem Relation Age of Onset   Anxiety disorder Mother    ADD / ADHD Mother    Depression Mother    Lung cancer Father        smoker   Bipolar disorder Daughter    Bipolar disorder Other    Diabetes Other        a lot of relatives   Suicidality Neg Hx    Colon cancer Neg Hx    Rectal cancer Neg Hx    Stomach cancer Neg Hx    Esophageal cancer Neg Hx     Social History   Socioeconomic History   Marital status: Married    Spouse name: Not on file   Number of children: Not on file   Years of education: Not on file   Highest education level: Not on file  Occupational History   Not on file  Tobacco Use   Smoking status: Never   Smokeless tobacco: Never  Vaping Use   Vaping status: Never Used  Substance and Sexual Activity   Alcohol use: No    Alcohol/week: 0.0 standard drinks of alcohol   Drug use: No   Sexual activity: Yes    Partners: Male    Birth control/protection: None  Other Topics Concern   Not on file  Social History Narrative   Not on file   Social Drivers of Health   Financial Resource Strain: Not on file  Food Insecurity: Not on file  Transportation Needs: Not on file  Physical Activity: Not on file  Stress: Not on file  Social Connections: Not on file    Review of  Systems  Constitutional: Negative.   Gastrointestinal: Negative.   Genitourinary: Negative.      Physical Exam No direct physical exam was performed  Vital Signs: LMP  (LMP Unknown) Comment: reports LMP was over a year ago   Imaging: IR EMBO TUMOR ORGAN ISCHEMIA INFARCT INC GUIDE ROADMAPPING Result Date: 07/27/2024 INDICATION: 58 year old with tuberous sclerosis and history of bilateral renal angiomyolipomas. Status post right nephrectomy. History of spontaneous renal hemorrhages. Previous catheter directed embolizations to left renal  tumors. Most recent MRI demonstrates enlarging left renal tumors and concern for blood products in 1 of the tumors. EXAM: 1. Left renal angiography: Left renal artery and selective angiography in the left kidney upper pole. 2. Left renal angiography with cone beam CT 3. Catheter directed particle embolization of two left renal artery branches in the upper pole. 4. Ultrasound guidance for vascular access MEDICATIONS: Ancef  2 g. The antibiotic was administered within 1 hour of the procedure ANESTHESIA/SEDATION: Moderate (conscious) sedation was employed during this procedure. A total of Versed  1 mg and fentanyl  50 mcg was administered intravenously at the order of the provider performing the procedure. Total intra-service moderate sedation time: 2 hours and 16 minutes. Patient's level of consciousness and vital signs were monitored continuously by radiology nurse throughout the procedure under the supervision of the provider performing the procedure. CONTRAST:  110 mL views opaque 270 FLUOROSCOPY: Radiation Exposure Index (as provided by the fluoroscopic device): 669 mGy Kerma COMPLICATIONS: None immediate. PROCEDURE: The procedure was explained to the patient. The risks and benefits of the procedure were discussed and the patient's questions were addressed. Informed consent was obtained from the patient. Time-out was performed. Patient was placed supine on the  interventional table. Right groin was prepped and draped in sterile fashion. Maximal barrier sterile technique was utilized including caps, mask, sterile gowns, sterile gloves, sterile drape, hand hygiene and skin antiseptic. Ultrasound confirmed a patent right common femoral artery. Ultrasound image was saved for documentation. Right groin was anesthetized with 1% lidocaine . A small incision was made. Using ultrasound guidance, 21 gauge needle was directed into the right common femoral artery and a micropuncture dilator set was placed. Five French vascular sheath was placed over a Bentson wire. Sos catheter was formed in the distal descending thoracic aorta and used to cannulate the left renal artery. Left renal arteriogram was performed. Progreat microcatheter was advanced into the left renal artery and selective angiography was performed. Catheter was advanced into the main branch supplying the superolateral portion of the left kidney. Selective angiography was performed in this main branch supplying the superolateral left kidney. Cone beam CT was also performed in this vessel. Microcatheter was advanced into the lateral end branch and additional selective angiography and cone beam CT was performed. Microcatheter repositioned in small branch supplying the superior end branch. Selective angiography was performed in this vessel. Tumor vascularity was identified in this vessel. This superior branch vessel was embolized using particle embolization. 300-500 micron Embospheres were injected until there was pruning of the vascular lesion and significantly decreased flow in this vessel. The catheter was slightly pulled back and used to cannulate the more lateral branch supplying additional tumor. This branch was treated with particle embolization using 300-500 micron Embospheres. This vessel was treated until there was near stagnant flow. Post embolization angiography was performed. Microcatheter was removed. Final  angiogram was performed in the left renal artery using the Sos catheter. Sos catheter was removed over a wire. Angiogram was performed through the right groin sheath. Right groin sheath was removed using Angio-Seal closure device. Right groin hemostasis at the end of the procedure. Bandage placed over the puncture site. Fluoroscopic and ultrasound images were taken and saved for documentation. FINDINGS: Left renal artery is widely patent. Left renal arteriogram demonstrates complex vascularity involving the upper pole and lateral left kidney compatible with known angiomyolipomas. The main branch supplying the superolateral left kidney was identified and targeted. Cone beam CT in this vessel demonstrated tumor vascularity including the exophytic lesion  near the splenic hilum that was the area of concern for having blood products on previous MRI. This vessel supplying the superolateral upper pole has 2 terminal branches, 1 is more superior and the other is more lateral. Both the superior and lateral terminal branches were selected and dedicated angiography demonstrated tumor vascularity in both areas. The more lateral branch appeared to be supplying the exophytic lesion. Both of these terminal branches were treated with particle embolization and there was markedly decreased flow and significant pruning following particle embolization. IMPRESSION: 1. Successful catheter directed embolization of angiomyolipomas in the superolateral aspect of the left kidney upper pole. Electronically Signed   By: Juliene Balder M.D.   On: 07/27/2024 13:40   IR Radiologist Eval & Mgmt Result Date: 07/19/2024 : See note in Epic. Electronically Signed   By: Juliene Balder M.D.   On: 07/19/2024 16:43   MR ABDOMEN WWO CONTRAST Result Date: 07/11/2024 CLINICAL DATA:  History of tuberous sclerosis status post right nephrectomy with multiple renal angiomyolipomas status post catheter directed embolization of dominant left upper pole lesion  05/28/2022 EXAM: MRI ABDOMEN WITHOUT AND WITH CONTRAST TECHNIQUE: Multiplanar multisequence MR imaging of the abdomen was performed both before and after the administration of intravenous contrast. CONTRAST:  9mL GADAVIST  GADOBUTROL  1 MMOL/ML IV SOLN COMPARISON:  CT abdomen and pelvis dated 12/06/2023, MRI abdomen dated 11/19/2022, 01/30/2022 FINDINGS: Lower chest: Trace left pleural effusion. Hepatobiliary: Subcentimeter cyst in peripheral segment 8 (4:10) and 7 (4:18). No bile duct dilation. Normal gallbladder. Pancreas: No mass, inflammatory changes, or other parenchymal abnormality identified. Spleen:  Within normal limits in size and appearance. Adrenals/Urinary Tract: No adrenal nodules. Right nephrectomy. Enlarged left kidney contains multiple masses, as before. Continued contraction of previously embolized lateral left upper pole mass, no longer discretely measurable. Slight interval increase in size of many of the additional masses, for example: -4.0 x 3.9 cm medial upper pole (14:44), previously 3.7 x 3.5 cm (remeasured) -4.5 x 3.9 cm posterior upper pole (14:50), previously 4.2 x 3.7 cm -5.3 x 4.2 cm posterolateral interpolar (14:62), previously 5.0 x 3.8 cm A 3.7 x 3.3 cm lateral upper pole mass (14:47), previously 3.1 x 2.8 cm, now also demonstrates an areas intrinsic T1 hyperintensity of the posterosuperior aspect (13:42). Stomach/Bowel: Small hiatal hernia. Visualized portions within the abdomen are unremarkable. Vascular/Lymphatic: No pathologically enlarged lymph nodes identified. No abdominal aortic aneurysm demonstrated. Other:  None. Musculoskeletal: No suspicious bone lesions identified. Partially imaged bilateral breast implants. Small fat-containing paraumbilical hernia. IMPRESSION: 1. Continued contraction of previously embolized lateral left upper pole mass, no longer discretely measurable. 2. Slight interval increase in size of multiple left renal masses, as described above. 3. A 3.7 x 3.3  cm lateral upper pole mass now demonstrates an area of intrinsic T1 hyperintensity of the posterosuperior aspect, which may reflect hemorrhage. 4. Trace left pleural effusion. Electronically Signed   By: Limin  Xu M.D.   On: 07/11/2024 21:32    Labs:  CBC: Recent Labs    07/27/24 0738  WBC 4.2  HGB 12.6  HCT 39.2  PLT 255    COAGS: Recent Labs    07/27/24 0738  INR 0.8    BMP: Recent Labs    07/27/24 0738  NA 143  K 3.7  CL 104  CO2 26  GLUCOSE 89  BUN 20  CALCIUM 9.4  CREATININE 0.88  GFRNONAA >60     Assessment and Plan:  58 year old with tuberous sclerosis and history of multiple renal angiomyolipomas.  Patient currently has a solitary left kidney with multiple renal lesions.  Concerning lesion in the lateral left kidney upper pole was treated with particle embolization on 07/27/2024.  Overall, the patient tolerated the procedure well.  She is currently not having any pain.  She reports that her follow-up renal function is normal but I I do not have record of this.  Patient probably developed an allergic reaction 24 hours after the embolization procedure.  This delayed reaction would be unusual for contrast reaction but indeterminate.  Despite multiple embolization procedures, the patient still has multiple left renal lesions that are at risk for hemorrhage.  Based on the MRI from 07/11/2024, there are at least 2 lesions measuring over 4 cm.  Treatment of these renal lesions is difficult due to the infiltrative nature of the lesions. We will continue to follow these renal lesions with imaging and only treat 1-2 lesions at a time in order to preserve her renal function.  Plan for follow-up abdominal MRI, with and without contrast, in 4 months.   Electronically Signed: Juliene JONELLE Balder 08/03/2024, 4:28 PM   I spent a total of    10 Minutes in remote  clinical consultation, greater than 50% of which was counseling/coordinating care for management of left renal lesions.     Visit type: Audio only (telephone). Audio (no video) only due to patient preference. Patient ID: Melissa Ochoa, female   DOB: [birth year], 58 y.o.   MRN: [id number]

## 2024-10-23 ENCOUNTER — Telehealth (HOSPITAL_COMMUNITY): Admitting: Psychiatry

## 2024-10-23 ENCOUNTER — Encounter (HOSPITAL_COMMUNITY): Payer: Self-pay | Admitting: Psychiatry

## 2024-10-23 VITALS — Wt 185.0 lb

## 2024-10-23 DIAGNOSIS — F411 Generalized anxiety disorder: Secondary | ICD-10-CM | POA: Diagnosis not present

## 2024-10-23 DIAGNOSIS — F3130 Bipolar disorder, current episode depressed, mild or moderate severity, unspecified: Secondary | ICD-10-CM

## 2024-10-23 DIAGNOSIS — F900 Attention-deficit hyperactivity disorder, predominantly inattentive type: Secondary | ICD-10-CM | POA: Diagnosis not present

## 2024-10-23 MED ORDER — ALPRAZOLAM 1 MG PO TABS: 1.0000 mg | ORAL_TABLET | Freq: Three times a day (TID) | ORAL | 2 refills | Status: AC | PRN

## 2024-10-23 MED ORDER — LAMOTRIGINE 100 MG PO TABS: 100.0000 mg | ORAL_TABLET | Freq: Two times a day (BID) | ORAL | 0 refills | Status: AC

## 2024-10-23 MED ORDER — SERTRALINE HCL 100 MG PO TABS: 100.0000 mg | ORAL_TABLET | Freq: Two times a day (BID) | ORAL | 0 refills | Status: AC

## 2024-10-23 MED ORDER — QUETIAPINE FUMARATE 300 MG PO TABS: 300.0000 mg | ORAL_TABLET | Freq: Every day | ORAL | 0 refills | Status: AC

## 2024-10-23 MED ORDER — ATOMOXETINE HCL 40 MG PO CAPS: 40.0000 mg | ORAL_CAPSULE | Freq: Two times a day (BID) | ORAL | 0 refills | Status: AC

## 2024-10-23 NOTE — Progress Notes (Signed)
 " [facility] MD Virtual Progress Note   Patient Location: Home Provider Location: Home Office  I connect with patient by telephone and verified that I am speaking with correct person by using two identifiers. I discussed the limitations of evaluation and management by telemedicine and the availability of in person appointments. I also discussed with the patient that there may be a patient responsible charge related to this service. The patient expressed understanding and agreed to proceed.  Melissa Ochoa [id number] 59 y.o.  10/23/2024 11:02 AM  History of Present Illness:  Patient is evaluated by phone session.  She could not do video but able to talk on the phone.  She reported lately not doing very well because her best friend who lives in [place]  died.  She went to [place]  and recently came back.  She also reported having some chest congestion.  She admitted continue to struggle with motivation, fatigue and again like to go back on Adderall.  She is taking multiple antihypertensive medication.  Recently she had renal angiogram for angiolipoma.  She is taking Xanax , Lamictal , Zoloft , Seroquel  and Strattera .  We increased the dose of Strattera  on the last visit.  She reported difficulty doing things but also had a good trip to [place] few months ago.  She is not happy because used to get [facility] samples but now have difficulty getting those samples.  She denies any active or passive suicidal thoughts or homicidal thoughts.  She denies any mania, psychosis, hallucination.  She denies any tremors or shakes or any EPS.  We have requested ADHD testing but patient does not want to do because it is expensive.  Patient is also not interested in therapy.  She lost weight because using samples of Wegovy but not sure if she can continue.  She denies drinking or using any illegal substances.  Past Psychiatric History: H/O bipolar disorder, anxiety, ADHD.  Took Adderall but it was  discontinued because not having formal psychological testing.  Tried Klonopin and Paxil but did not like.  Saw Dr. Verba and Brutus.  No history of suicidal attempt, psychosis but history of mania.    Past Medical History:  Diagnosis Date   ADHD (attention deficit hyperactivity disorder)    Anxiety    Bipolar disorder (HCC)    Cervical disc disease    Colon polyps    adenomatous   Depression    Hypertension    Iron deficiency    Kidney disease    renal angiomyolipomas   Liver cyst    Melanosis    Pneumonia    Tuberous sclerosis (HCC)     Outpatient Encounter Medications as of 10/23/2024  Medication Sig   ALPRAZolam  (XANAX ) 1 MG tablet Take 1 tablet (1 mg total) by mouth 3 (three) times daily as needed for anxiety.   atomoxetine  (STRATTERA ) 40 MG capsule Take 1 capsule (40 mg total) by mouth 2 (two) times daily with a meal.   labetalol  (NORMODYNE ) 300 MG tablet Take 900 mg by mouth 2 (two) times daily.   lamoTRIgine  (LAMICTAL ) 100 MG tablet Take 1 tablet (100 mg total) by mouth 2 (two) times daily.   losartan  (COZAAR ) 50 MG tablet Take 50 mg by mouth daily.    QUEtiapine  (SEROQUEL ) 300 MG tablet Take 1 tablet (300 mg total) by mouth at bedtime.   sertraline  (ZOLOFT ) 100 MG tablet Take 1 tablet (100 mg total) by mouth in the morning and at bedtime.   No facility-administered encounter medications  on file as of 10/23/2024.    Recent Results (from the past 2160 hours)  CBC     Status: None   Collection Time: 07/27/24  7:38 AM  Result Value Ref Range   WBC 4.2 4.0 - 10.5 K/uL   RBC 4.32 3.87 - 5.11 MIL/uL   Hemoglobin 12.6 12.0 - 15.0 g/dL   HCT 60.7 63.9 - 53.9 %   MCV 90.7 80.0 - 100.0 fL   MCH 29.2 26.0 - 34.0 pg   MCHC 32.1 30.0 - 36.0 g/dL   RDW 86.1 88.4 - 84.4 %   Platelets 255 150 - 400 K/uL   nRBC 0.0 0.0 - 0.2 %    Comment: Performed at [facility], [address] [place], [place] [address]  Protime-INR     Status: None   Collection Time: 07/27/24   7:38 AM  Result Value Ref Range   Prothrombin Time 12.1 11.4 - 15.2 seconds   INR 0.8 0.8 - 1.2    Comment: (NOTE) INR goal varies based on device and disease states. Performed at [facility], [address] [place], [place] [address]   Basic metabolic panel     Status: None   Collection Time: 07/27/24  7:38 AM  Result Value Ref Range   Sodium 143 135 - 145 mmol/L   Potassium 3.7 3.5 - 5.1 mmol/L   Chloride 104 98 - 111 mmol/L   CO2 26 22 - 32 mmol/L   Glucose, Bld 89 70 - 99 mg/dL    Comment: Glucose reference range applies only to samples taken after fasting for at least 8 hours.   BUN 20 6 - 20 mg/dL   Creatinine, Ser 9.11 0.44 - 1.00 mg/dL   Calcium 9.4 8.9 - 89.6 mg/dL   GFR, Estimated >39 >39 mL/min    Comment: (NOTE) Calculated using the CKD-EPI Creatinine Equation (2021)    Anion gap 13 5 - 15    Comment: Performed at [facility], [address] [place], [place] [address]     Psychiatric Specialty Exam: Physical Exam  Review of Systems  HENT:  Positive for congestion.   Respiratory:  Positive for cough.     Weight 185 lb (83.9 kg).There is no height or weight on file to calculate BMI.  General Appearance: NA  Eye Contact:  NA  Speech:  Slow  Volume:  Decreased  Mood:  Dysphoric and tired  Affect:  NA  Thought Process:  Goal Directed  Orientation:  Full (Time, Place, and Person)  Thought Content:  Rumination  Suicidal Thoughts:  No  Homicidal Thoughts:  No  Memory:  Immediate;   Good Recent;   Good Remote;   Good  Judgement:  Intact  Insight:  Shallow  Psychomotor Activity:  NA  Concentration:  Concentration: Good and Attention Span: Good  Recall:  Good  Fund of Knowledge:  Good  Language:  Good  Akathisia:  No  Handed:  Right  AIMS (if indicated):     Assets:  Communication Skills Desire for Improvement Housing Transportation  ADL's:  Intact  Cognition:  WNL  Sleep:  ok       03/25/2023    2:17 PM 05/07/2022    1:17  PM 02/05/2022    3:29 PM 01/01/2022   11:07 AM 08/28/2021    1:48 PM  Depression screen PHQ 2/9  Decreased Interest 2 2 0 1 2  Down, Depressed, Hopeless 2 3 1 3 2   PHQ - 2  Score 4 5 1 4 4   Altered sleeping 0 3 0 1 2  Tired, decreased energy 2 3 2 2 1   Change in appetite 2 1 2 2  0  Feeling bad or failure about yourself  3 3 2 3 3   Trouble concentrating 0 1 0 2 0  Moving slowly or fidgety/restless 0 0 0 0 0  Suicidal thoughts 0 0 0 0 0  PHQ-9 Score 11  16  7  14  10    Difficult doing work/chores Very difficult Extremely dIfficult Somewhat difficult Extremely dIfficult Very difficult     Data saved with a previous flowsheet row definition    Assessment/Plan: Bipolar I disorder, most recent episode depressed (HCC) - Plan: atomoxetine  (STRATTERA ) 40 MG capsule, lamoTRIgine  (LAMICTAL ) 100 MG tablet, QUEtiapine  (SEROQUEL ) 300 MG tablet, sertraline  (ZOLOFT ) 100 MG tablet  GAD (generalized anxiety disorder) - Plan: ALPRAZolam  (XANAX ) 1 MG tablet, atomoxetine  (STRATTERA ) 40 MG capsule, QUEtiapine  (SEROQUEL ) 300 MG tablet, sertraline  (ZOLOFT ) 100 MG tablet  Attention deficit hyperactivity disorder (ADHD), predominantly inattentive type - Plan: atomoxetine  (STRATTERA ) 40 MG capsule  Patient is 59 year old Caucasian married female with bipolar disorder, generalized anxiety disorder, ADHD predominantly inattentive type.  She has hypertension, osteoarthritis, renal angiolipoma and tuberous sclerosis.  I had a long discussion with the patient about stimulant interaction and side effects.  Given the history of high blood pressure, taking 2 antihypertensive medication and not having establish diagnosis/psychological testing cannot provide stimulant.  She is taking Strattera .  She does enjoy family time denies any mania or psychosis.  I also reviewed blood work results which was done on October 30.  CBC, CMP normal.  She is trying to lose weight with Wegovy samples.  She is not interested in therapy.  I offered  if she feel that I am not helpful and we discussed refer her out but patient like to stay with the provider and like to keep the current medication.  Will continue Seroquel  300 mg at bedtime, Strattera  40 mg twice a day, Zoloft  100 mg twice a day, Lamictal  100 mg twice a day and Xanax  1 mg 3 times a day.  We discussed polypharmacy in detail.  Patient does not want to cut down the medication.  Again offered therapy but patient declined.  Recommend to call back if she has any question concern if she feels worsening of symptoms.  Will follow-up in 3 months unless patient need a sooner appointment.  I also discussed she should have in person visit as her phone is not working for video sessions.  Follow Up Instructions:     I discussed the assessment and treatment plan with the patient. The patient was provided an opportunity to ask questions and all were answered. The patient agreed with the plan and demonstrated an understanding of the instructions.   The patient was advised to call back or seek an in-person evaluation if the symptoms worsen or if the condition fails to improve as anticipated.    Collaboration of Care: Other provider involved in patient's care AEB notes are available in epic to review  Patient/Guardian was advised Release of Information must be obtained prior to any record release in order to collaborate their care with an outside provider. Patient/Guardian was advised if they have not already done so to contact the registration department to sign all necessary forms in order for us  to release information regarding their care.   Consent: Patient/Guardian gives verbal consent for treatment and assignment of benefits for services provided  during this visit. Patient/Guardian expressed understanding and agreed to proceed.     Total encounter time 31 minutes which includes, chart reviewed, obtain history, examination, reviewing and communication results, placing orders, care coordination  and documentation clinical information in EHR during this encounter.   Note: This document was prepared by [employer] voice dictation technology and any errors that results from this process are unintentional.    Leni ONEIDA Client, MD 10/23/2024    "

## 2024-11-03 ENCOUNTER — Other Ambulatory Visit (HOSPITAL_COMMUNITY): Payer: Self-pay | Admitting: Family Medicine

## 2024-11-03 DIAGNOSIS — R109 Unspecified abdominal pain: Secondary | ICD-10-CM

## 2025-01-22 ENCOUNTER — Telehealth (HOSPITAL_COMMUNITY): Admitting: Psychiatry
